# Patient Record
Sex: Female | Born: 1950 | Race: White | Hispanic: No | Marital: Married | Smoking: Never smoker
Health system: Southern US, Community
[De-identification: ages and names within clinical notes are randomized; demographics above are authoritative.]

## PROBLEM LIST (undated history)

## (undated) DIAGNOSIS — R011 Cardiac murmur, unspecified: Secondary | ICD-10-CM

## (undated) DIAGNOSIS — Z87898 Personal history of other specified conditions: Secondary | ICD-10-CM

## (undated) DIAGNOSIS — C4490 Unspecified malignant neoplasm of skin, unspecified: Secondary | ICD-10-CM

## (undated) DIAGNOSIS — D649 Anemia, unspecified: Secondary | ICD-10-CM

## (undated) DIAGNOSIS — R0600 Dyspnea, unspecified: Secondary | ICD-10-CM

## (undated) DIAGNOSIS — E119 Type 2 diabetes mellitus without complications: Secondary | ICD-10-CM

## (undated) DIAGNOSIS — K219 Gastro-esophageal reflux disease without esophagitis: Secondary | ICD-10-CM

## (undated) DIAGNOSIS — I209 Angina pectoris, unspecified: Secondary | ICD-10-CM

## (undated) DIAGNOSIS — I10 Essential (primary) hypertension: Secondary | ICD-10-CM

## (undated) DIAGNOSIS — G459 Transient cerebral ischemic attack, unspecified: Secondary | ICD-10-CM

## (undated) DIAGNOSIS — E7800 Pure hypercholesterolemia, unspecified: Secondary | ICD-10-CM

## (undated) DIAGNOSIS — Z95818 Presence of other cardiac implants and grafts: Secondary | ICD-10-CM

## (undated) DIAGNOSIS — C50919 Malignant neoplasm of unspecified site of unspecified female breast: Secondary | ICD-10-CM

## (undated) HISTORY — PX: DILATION AND CURETTAGE OF UTERUS: SHX78

## (undated) HISTORY — PX: HAND SURGERY: SHX662

## (undated) HISTORY — PX: KNEE ARTHROSCOPY: SHX127

## (undated) HISTORY — DX: Transient cerebral ischemic attack, unspecified: G45.9

## (undated) HISTORY — DX: Pure hypercholesterolemia, unspecified: E78.00

## (undated) HISTORY — PX: GYNECOLOGIC CRYOSURGERY: SHX857

## (undated) HISTORY — DX: Anemia, unspecified: D64.9

## (undated) HISTORY — DX: Essential (primary) hypertension: I10

## (undated) HISTORY — PX: FOOT SURGERY: SHX648

## (undated) HISTORY — DX: Personal history of other specified conditions: Z87.898

## (undated) HISTORY — PX: TUBAL LIGATION: SHX77

## (undated) HISTORY — DX: Malignant neoplasm of unspecified site of unspecified female breast: C50.919

## (undated) HISTORY — DX: Type 2 diabetes mellitus without complications: E11.9

---

## 1987-11-21 HISTORY — PX: BREAST CYST ASPIRATION: SHX578

## 2006-11-07 ENCOUNTER — Ambulatory Visit: Payer: Self-pay | Admitting: Internal Medicine

## 2006-11-09 ENCOUNTER — Ambulatory Visit: Payer: Self-pay | Admitting: Internal Medicine

## 2006-12-24 ENCOUNTER — Other Ambulatory Visit: Payer: Self-pay

## 2006-12-24 ENCOUNTER — Inpatient Hospital Stay: Payer: Self-pay | Admitting: Internal Medicine

## 2007-01-18 ENCOUNTER — Ambulatory Visit: Payer: Self-pay | Admitting: Internal Medicine

## 2007-07-08 ENCOUNTER — Ambulatory Visit: Payer: Self-pay | Admitting: Gastroenterology

## 2007-11-19 ENCOUNTER — Ambulatory Visit: Payer: Self-pay | Admitting: Internal Medicine

## 2008-03-27 ENCOUNTER — Ambulatory Visit: Payer: Self-pay | Admitting: Unknown Physician Specialty

## 2008-04-02 ENCOUNTER — Ambulatory Visit: Payer: Self-pay | Admitting: Unknown Physician Specialty

## 2008-05-18 ENCOUNTER — Ambulatory Visit: Payer: Self-pay | Admitting: Unknown Physician Specialty

## 2008-05-18 ENCOUNTER — Other Ambulatory Visit: Payer: Self-pay

## 2008-05-20 HISTORY — PX: OTHER SURGICAL HISTORY: SHX169

## 2008-06-02 ENCOUNTER — Ambulatory Visit: Payer: Self-pay | Admitting: Unknown Physician Specialty

## 2008-06-09 ENCOUNTER — Ambulatory Visit: Payer: Self-pay | Admitting: Unknown Physician Specialty

## 2008-11-19 ENCOUNTER — Ambulatory Visit: Payer: Self-pay | Admitting: Internal Medicine

## 2009-08-03 ENCOUNTER — Encounter: Admission: RE | Admit: 2009-08-03 | Discharge: 2009-08-03 | Payer: Self-pay | Admitting: Orthopedic Surgery

## 2009-10-01 ENCOUNTER — Ambulatory Visit: Payer: Self-pay | Admitting: Internal Medicine

## 2009-10-20 HISTORY — PX: FOOT SURGERY: SHX648

## 2009-11-11 LAB — HM DIABETES EYE EXAM

## 2009-11-18 ENCOUNTER — Ambulatory Visit: Payer: Self-pay | Admitting: Podiatry

## 2010-05-13 ENCOUNTER — Ambulatory Visit: Payer: Self-pay | Admitting: Internal Medicine

## 2010-06-06 ENCOUNTER — Ambulatory Visit: Payer: Self-pay | Admitting: Gastroenterology

## 2010-06-20 ENCOUNTER — Ambulatory Visit: Payer: Self-pay | Admitting: Internal Medicine

## 2010-07-12 ENCOUNTER — Ambulatory Visit: Payer: Self-pay | Admitting: Internal Medicine

## 2010-07-21 ENCOUNTER — Ambulatory Visit: Payer: Self-pay | Admitting: Internal Medicine

## 2010-10-10 ENCOUNTER — Ambulatory Visit: Payer: Self-pay | Admitting: Internal Medicine

## 2010-10-20 ENCOUNTER — Ambulatory Visit: Payer: Self-pay | Admitting: Internal Medicine

## 2011-03-30 ENCOUNTER — Ambulatory Visit: Payer: Self-pay | Admitting: Podiatry

## 2011-06-06 ENCOUNTER — Ambulatory Visit: Payer: Self-pay | Admitting: Internal Medicine

## 2011-06-08 ENCOUNTER — Ambulatory Visit: Payer: Self-pay | Admitting: Internal Medicine

## 2011-07-18 ENCOUNTER — Ambulatory Visit: Payer: Self-pay | Admitting: Anesthesiology

## 2011-07-20 ENCOUNTER — Ambulatory Visit: Payer: Self-pay | Admitting: Podiatry

## 2012-05-30 LAB — HM PAP SMEAR: HM Pap smear: NORMAL

## 2012-07-17 LAB — HM DIABETES EYE EXAM

## 2012-08-02 LAB — HM DIABETES EYE EXAM

## 2012-08-30 ENCOUNTER — Ambulatory Visit: Payer: Self-pay | Admitting: Unknown Physician Specialty

## 2012-09-20 ENCOUNTER — Ambulatory Visit: Payer: Self-pay | Admitting: Unknown Physician Specialty

## 2012-09-20 HISTORY — PX: KNEE SURGERY: SHX244

## 2012-10-10 ENCOUNTER — Encounter: Payer: Self-pay | Admitting: Internal Medicine

## 2012-10-10 ENCOUNTER — Ambulatory Visit (INDEPENDENT_AMBULATORY_CARE_PROVIDER_SITE_OTHER): Payer: Medicare Other | Admitting: Internal Medicine

## 2012-10-10 VITALS — BP 118/70 | HR 61 | Temp 97.9°F | Ht 66.0 in | Wt 183.5 lb

## 2012-10-10 DIAGNOSIS — G459 Transient cerebral ischemic attack, unspecified: Secondary | ICD-10-CM

## 2012-10-10 DIAGNOSIS — E119 Type 2 diabetes mellitus without complications: Secondary | ICD-10-CM

## 2012-10-10 DIAGNOSIS — C50919 Malignant neoplasm of unspecified site of unspecified female breast: Secondary | ICD-10-CM

## 2012-10-10 DIAGNOSIS — I10 Essential (primary) hypertension: Secondary | ICD-10-CM

## 2012-10-10 DIAGNOSIS — D649 Anemia, unspecified: Secondary | ICD-10-CM

## 2012-10-10 DIAGNOSIS — Z853 Personal history of malignant neoplasm of breast: Secondary | ICD-10-CM | POA: Insufficient documentation

## 2012-10-10 DIAGNOSIS — E1149 Type 2 diabetes mellitus with other diabetic neurological complication: Secondary | ICD-10-CM | POA: Insufficient documentation

## 2012-10-10 DIAGNOSIS — Z8673 Personal history of transient ischemic attack (TIA), and cerebral infarction without residual deficits: Secondary | ICD-10-CM | POA: Insufficient documentation

## 2012-10-10 DIAGNOSIS — E7800 Pure hypercholesterolemia, unspecified: Secondary | ICD-10-CM

## 2012-10-10 MED ORDER — TELMISARTAN 20 MG PO TABS: 20.0000 mg | ORAL_TABLET | Freq: Every day | ORAL | Status: DC

## 2012-10-10 MED ORDER — ROSUVASTATIN CALCIUM 10 MG PO TABS: 10.0000 mg | ORAL_TABLET | Freq: Every day | ORAL | Status: DC

## 2012-10-10 NOTE — Assessment & Plan Note (Signed)
On Simvastatin.  Low cholesterol diet and exercise.  Check lipid panel and liver function with next labs.  

## 2012-10-10 NOTE — Patient Instructions (Addendum)
It was nice seeing you today.  I am glad you are doing better.  Let me know if you need anything.  

## 2012-10-10 NOTE — Assessment & Plan Note (Signed)
On daily aspirin. Asymptomatic.  Continue risk factor modification.

## 2012-10-10 NOTE — Assessment & Plan Note (Signed)
Colonoscopy 8/08 normal.  EGD 5/09 revealed Schatzki's ring with hiatal hernia.  Currently asymptomatic.  Hgb 05/22/12 - wnl.  Follow cbc.

## 2012-10-14 ENCOUNTER — Other Ambulatory Visit (INDEPENDENT_AMBULATORY_CARE_PROVIDER_SITE_OTHER): Payer: Medicare Other

## 2012-10-14 ENCOUNTER — Other Ambulatory Visit: Payer: Medicare Other

## 2012-10-14 DIAGNOSIS — E119 Type 2 diabetes mellitus without complications: Secondary | ICD-10-CM

## 2012-10-14 DIAGNOSIS — E7800 Pure hypercholesterolemia, unspecified: Secondary | ICD-10-CM

## 2012-10-14 LAB — HEMOGLOBIN A1C: Hgb A1c MFr Bld: 6.6 % — ABNORMAL HIGH (ref 4.6–6.5)

## 2012-10-14 LAB — LIPID PANEL
Cholesterol: 171 mg/dL (ref 0–200)
HDL: 56.3 mg/dL (ref >39.00)
Triglycerides: 57 mg/dL (ref 0.0–149.0)

## 2012-10-27 ENCOUNTER — Encounter: Payer: Self-pay | Admitting: Internal Medicine

## 2012-10-27 NOTE — Assessment & Plan Note (Signed)
Continues follow up at [facility] - oncology.  (Dr Duff).  Mammograms followed at [facility].    

## 2012-10-27 NOTE — Assessment & Plan Note (Signed)
On simvastatin.  Low cholesterol diet and exercise.  Follow lipid panel and liver function.      

## 2012-10-27 NOTE — Progress Notes (Signed)
  Subjective:    Patient ID: Angel Floyd, female    DOB: Jun 11, 1951, 61 y.o.   MRN: [id number]  HPI 61 year old female with past history of hypercholesterolemia, hypertension, breast cancer and previously presumed TIA.  She comes in today for a scheduled follow up.  Had knee surgery three weeks ago for a torn meniscus.  S/p physical therapy.  Doing well.  Seeing Dr Erin Sons.  Did see Dr Rosita Kea for her right fourth finger.  Had a trigger finger.  Discussed injection.  Continue to follow up with ortho.  Overall she feels better.  Off her "cancer medication".  Has follow up tomorrow.  Brought in no recorded sugar readings.  States am sugar - around 140.  Now that she is feeling better, plans to start exercising more.    Past Medical History  Diagnosis Date  . Hypercholesterolemia   . Hypertension   . Diabetes mellitus   . Breast cancer   . Anemia   . TIA (transient ischemic attack)   . History of abnormal Pap smear     class III, required cryosurgery    Current Outpatient Prescriptions on File Prior to Visit  Medication Sig Dispense Refill  . Calcium-Vitamin D (CALTRATE 600 PLUS-VIT D PO) Take 1 tablet by mouth 2 (two) times daily.      Marland Kitchen esomeprazole (NEXIUM) 40 MG capsule Take 40 mg by mouth daily before breakfast.      . rosuvastatin (CRESTOR) 10 MG tablet Take 1 tablet (10 mg total) by mouth daily.  30 tablet  6  . telmisartan (MICARDIS) 20 MG tablet Take 1 tablet (20 mg total) by mouth daily.  30 tablet  6    Review of Systems Patient denies any headache, lightheadedness or dizziness. No sinus or allergy symptoms.   No chest pain, tightness or palpitations.  No increased shortness of breath, cough or congestion.  No nausea or vomiting.  No abdominal pain or cramping.  No bowel change, such as diarrhea, constipation, BRBPR or melana.  No urine change.  Knee is doing better.       Objective:   Physical Exam Filed Vitals:   10/10/12 1142  BP: 118/70  Pulse: 61  Temp: 97.9 F  (36.6 C)   Blood pressure recheck:  70/74  61 year old female in no acute distress.   HEENT:  Nares - clear.  OP- without lesions or erythema.  NECK:  Supple, nontender.  No audible bruit.   HEART:  Appears to be regular. LUNGS:  Without crackles or wheezing audible.  Respirations even and unlabored.   RADIAL PULSE:  Equal bilaterally.  ABDOMEN:  Soft, nontender.     EXTREMITIES:  No increased edema to be present.      Assessment & Plan:  MSK.  S/P knee surgery.  Doing well.  Continues to follow up with Dr Gavin Potters.  Will follow up with Dr Rosita Kea regarding her trigger finger.    CARDIOVASCULAR.  Stress test 02/10/09 revealed no ischemia.  EF 64%.  Follow.    HEALTH MAINTENANCE.  Physical 05/30/12.  Colonoscopy 8/08 normal.  Mammograms followed at [facility] - oncology.  Bone density scheduled through oncology.

## 2012-10-27 NOTE — Assessment & Plan Note (Signed)
Previous GI w/up included colonoscopy 07/08/07 - normal.  EGD 04/02/08 revealed Schatzki's ring with hiatal hernia.  Currently asymptomatic.  Hgb 05/22/12 - wnl.  Follow cbc.

## 2012-10-27 NOTE — Assessment & Plan Note (Signed)
Maintained on aspirin.  Currently asymptomatic.  No reoccurring symptoms.    

## 2012-10-27 NOTE — Assessment & Plan Note (Signed)
Brought in no recorded sugar readings.  She has desired to hold on medication.  Low carb diet and exercise.  Follow.  Check met b and a1c with next labs.

## 2012-10-27 NOTE — Assessment & Plan Note (Signed)
Blood pressure is doing well.  Continue same meds.  Follow metabolic panel.     

## 2012-12-04 ENCOUNTER — Telehealth: Payer: Self-pay | Admitting: Internal Medicine

## 2012-12-04 ENCOUNTER — Emergency Department: Payer: Self-pay

## 2012-12-04 ENCOUNTER — Ambulatory Visit: Payer: Medicare Other | Admitting: Adult Health

## 2012-12-04 LAB — CBC
HCT: 45.9 % (ref 35.0–47.0)
HGB: 16.2 g/dL — ABNORMAL HIGH (ref 12.0–16.0)
MCH: 31.3 pg (ref 26.0–34.0)
MCHC: 35.2 g/dL (ref 32.0–36.0)
MCV: 89 fL (ref 80–100)
RBC: 5.16 10*6/uL (ref 3.80–5.20)
RDW: 12.3 % (ref 11.5–14.5)

## 2012-12-04 LAB — COMPREHENSIVE METABOLIC PANEL
Albumin: 4.2 g/dL (ref 3.4–5.0)
Alkaline Phosphatase: 128 U/L (ref 50–136)
Anion Gap: 10 (ref 7–16)
Bilirubin,Total: 0.8 mg/dL (ref 0.2–1.0)
Chloride: 108 mmol/L — ABNORMAL HIGH (ref 98–107)
Creatinine: 0.9 mg/dL (ref 0.60–1.30)
EGFR (African American): >60
Osmolality: 291 (ref 275–301)
Potassium: 4.1 mmol/L (ref 3.5–5.1)
SGPT (ALT): 54 U/L (ref 12–78)
Total Protein: 7.5 g/dL (ref 6.4–8.2)

## 2012-12-04 LAB — URINALYSIS, COMPLETE
Bacteria: NONE SEEN
Bilirubin,UR: NEGATIVE
Blood: NEGATIVE
Glucose,UR: >=500 mg/dL (ref 0–75)
Nitrite: NEGATIVE
Ph: 5 (ref 4.5–8.0)
Protein: NEGATIVE
RBC,UR: 2 /HPF (ref 0–5)
Specific Gravity: 1.031 (ref 1.003–1.030)
WBC UR: 1 /HPF (ref 0–5)

## 2012-12-04 LAB — LIPASE, BLOOD: Lipase: 89 U/L (ref 73–393)

## 2012-12-04 NOTE — Telephone Encounter (Deleted)
Addendum to below:  Clinical profile completed per historian review.  Unable to access EPIC until end of call.

## 2012-12-04 NOTE — Telephone Encounter (Addendum)
Patient Information:  Caller Name: Maurine Minister  Phone: [phone]  Patient: Angel, Floyd  Gender: Female  DOB: [birth year]  Age: 62 Years  PCP: Dale Haliimaile  Office Follow Up:  Does the office need to follow up with this patient?: No  Instructions For The Office: N/A  RN Note:  Vomited x 4; Diarrhea about 15 times yellow and watery (since 05:00 12/04/12) with chills. Not likely food poisoning with husband and wife having same meal last night.  Afebrile.  Has tried ice chips. Patient does not wanting to come in.  RN concerned with amount of diarrhea and hydration.  Can hear patient retching as husband is talking to RN.  Explained the hydration concern with the amount of diarrhea qualifying as severe. Offered patient appointment, ER or to send message to office regarding patient not wanting to come in to see if something could be called. After much discussion decided to take appointment with Ms Rey at 14:00 today.  Symptoms  Reason For Call & Symptoms: Awoke at 05:00; with vomiting and diarrhea with chills  Reviewed Health History In EMR: Yes  Reviewed Medications In EMR: Yes  Reviewed Allergies In EMR: Yes  Reviewed Surgeries / Procedures: Yes  Date of Onset of Symptoms: 12/04/2012  Addendum to below:  Clinical profile completed per historian review.  Unable to access EPIC until end of call.  Guideline(s) Used:  Diarrhea  Disposition Per Guideline:   Go to Office Now  Reason For Disposition Reached:   Age > 60 years and has had > 6 diarrhea stools in past 24 hours  Advice Given:  N/A  Appointment Scheduled:  12/04/2012 14:00:00 Appointment Scheduled Provider:  Orville Govern

## 2012-12-04 NOTE — Telephone Encounter (Signed)
Forward to Dr. Scott 

## 2012-12-05 ENCOUNTER — Ambulatory Visit: Payer: Self-pay | Admitting: Internal Medicine

## 2013-02-07 ENCOUNTER — Encounter: Payer: Self-pay | Admitting: Internal Medicine

## 2013-02-07 ENCOUNTER — Ambulatory Visit (INDEPENDENT_AMBULATORY_CARE_PROVIDER_SITE_OTHER): Payer: Medicare Other | Admitting: Internal Medicine

## 2013-02-07 VITALS — BP 110/70 | HR 74 | Temp 97.9°F | Ht 66.0 in | Wt 182.8 lb

## 2013-02-07 DIAGNOSIS — E119 Type 2 diabetes mellitus without complications: Secondary | ICD-10-CM

## 2013-02-07 DIAGNOSIS — G459 Transient cerebral ischemic attack, unspecified: Secondary | ICD-10-CM

## 2013-02-07 DIAGNOSIS — D649 Anemia, unspecified: Secondary | ICD-10-CM

## 2013-02-07 DIAGNOSIS — E7800 Pure hypercholesterolemia, unspecified: Secondary | ICD-10-CM

## 2013-02-07 DIAGNOSIS — I10 Essential (primary) hypertension: Secondary | ICD-10-CM

## 2013-02-09 ENCOUNTER — Encounter: Payer: Self-pay | Admitting: Internal Medicine

## 2013-02-09 NOTE — Assessment & Plan Note (Signed)
Brought in no recorded sugar readings.  Low carb diet and exercise.  Follow.  Check met b and a1c with next labs.

## 2013-02-09 NOTE — Assessment & Plan Note (Signed)
Maintained on aspirin.  Currently asymptomatic.  No reoccurring symptoms.    

## 2013-02-09 NOTE — Assessment & Plan Note (Signed)
Continues follow up at [facility] - oncology.  (Dr Duff).  Mammograms followed at [facility].    

## 2013-02-09 NOTE — Assessment & Plan Note (Signed)
Previous GI w/up included colonoscopy 07/08/07 - normal.  EGD 04/02/08 revealed Schatzki's ring with hiatal hernia.  Currently asymptomatic.  Hgb 05/22/12 - wnl.  Follow cbc.

## 2013-02-09 NOTE — Assessment & Plan Note (Signed)
Blood pressure is doing well.  Continue same meds.  Follow metabolic panel.     

## 2013-02-09 NOTE — Assessment & Plan Note (Signed)
On simvastatin.  Low cholesterol diet and exercise.  Follow lipid panel and liver function.      

## 2013-02-09 NOTE — Progress Notes (Signed)
Subjective:    Patient ID: Angel Floyd, female    DOB: [birth year], 62 y.o.   MRN: [id number]  HPI 62 year old female with past history of hypercholesterolemia, hypertension, breast cancer and previously presumed TIA.  She comes in today for a scheduled follow up.  Had knee surgery for a torn meniscus.  S/p physical therapy.  Seeing Dr Erin Sons.  Knee still bothering her.  Planning to follow up with Dr Gavin Potters.   Did see Dr Rosita Kea for her right fourth finger.  Had a trigger finger.  Discussed injection.  Continue to follow up with ortho.  Overall she feels she is doing relatively well.  Off her "cancer medication".  She has had some increased congestion.  Using nasal spray and mucinex and doing better.  Joined [employer].  Riding the exercise bike.  No nausea or vomiting.  Bowels stable.     Past Medical History  Diagnosis Date  . Hypercholesterolemia   . Hypertension   . Diabetes mellitus   . Breast cancer   . Anemia   . TIA (transient ischemic attack)   . History of abnormal Pap smear     class III, required cryosurgery    Current Outpatient Prescriptions on File Prior to Visit  Medication Sig Dispense Refill  . aspirin 81 MG tablet Take 81 mg by mouth daily.      . Calcium-Vitamin D (CALTRATE 600 PLUS-VIT D PO) Take 1 tablet by mouth 2 (two) times daily.      Marland Kitchen esomeprazole (NEXIUM) 40 MG capsule Take 40 mg by mouth daily before breakfast.      . FeFum-FePoly-FA-B Cmp-C-Biot (INTEGRA PLUS PO) Take by mouth every evening.      . fish oil-omega-3 fatty acids 1000 MG capsule Take 1 g by mouth 2 (two) times daily.      . rosuvastatin (CRESTOR) 10 MG tablet Take 1 tablet (10 mg total) by mouth daily.  30 tablet  6  . telmisartan (MICARDIS) 20 MG tablet Take 1 tablet (20 mg total) by mouth daily.  30 tablet  6   No current facility-administered medications on file prior to visit.    Review of Systems Patient denies any headache, lightheadedness or dizziness.  Congestion has  improved.  No chest pain, tightness or palpitations.  No increased shortness of breath, cough or congestion.  No nausea or vomiting.  No abdominal pain or cramping.  No acid reflux.  No bowel change, such as diarrhea, constipation, BRBPR or melana.  No urine change.  Knee issues as outlined.      Objective:   Physical Exam  Filed Vitals:   02/07/13 0904  BP: 110/70  Pulse: 74  Temp: 97.9 F (36.6 C)   Blood pressure recheck:  130/68, pulse 54  62 year old female in no acute distress.   HEENT:  Nares - clear.  OP- without lesions or erythema.  NECK:  Supple, nontender.  No audible bruit.   HEART:  Appears to be regular. LUNGS:  Without crackles or wheezing audible.  Respirations even and unlabored.   RADIAL PULSE:  Equal bilaterally.  ABDOMEN:  Soft, nontender.     EXTREMITIES:  No increased edema to be present.      Assessment & Plan:  MSK.  S/P knee surgery.  Continues to follow up with Dr Gavin Potters.  Will follow up with Dr Rosita Kea regarding her trigger finger.    CARDIOVASCULAR.  Stress test 02/10/09 revealed no ischemia.  EF 64%.  Follow.  HEALTH MAINTENANCE.  Physical 05/30/12.  Colonoscopy 8/08 normal.  Mammograms followed at [facility] - oncology.  Bone density scheduled through oncology.

## 2013-02-14 ENCOUNTER — Other Ambulatory Visit (INDEPENDENT_AMBULATORY_CARE_PROVIDER_SITE_OTHER): Payer: PRIVATE HEALTH INSURANCE

## 2013-02-14 ENCOUNTER — Ambulatory Visit: Payer: Self-pay | Admitting: Unknown Physician Specialty

## 2013-02-14 DIAGNOSIS — E119 Type 2 diabetes mellitus without complications: Secondary | ICD-10-CM

## 2013-02-14 DIAGNOSIS — E7800 Pure hypercholesterolemia, unspecified: Secondary | ICD-10-CM

## 2013-02-14 LAB — LIPID PANEL
HDL: 53.2 mg/dL (ref >39.00)
Total CHOL/HDL Ratio: 3
Triglycerides: 58 mg/dL (ref 0.0–149.0)

## 2013-02-14 LAB — HEPATIC FUNCTION PANEL
AST: 19 U/L (ref 0–37)
Albumin: 3.9 g/dL (ref 3.5–5.2)

## 2013-02-14 LAB — BASIC METABOLIC PANEL
CO2: 28 mEq/L (ref 19–32)
Calcium: 9 mg/dL (ref 8.4–10.5)
GFR: 86.15 mL/min (ref >60.00)
Sodium: 139 mEq/L (ref 135–145)

## 2013-02-15 ENCOUNTER — Telehealth: Payer: Self-pay | Admitting: Internal Medicine

## 2013-02-15 NOTE — Telephone Encounter (Signed)
Notified of lab results via my chart.  

## 2013-03-11 ENCOUNTER — Telehealth: Payer: Self-pay | Admitting: Internal Medicine

## 2013-03-11 ENCOUNTER — Encounter: Payer: Self-pay | Admitting: Internal Medicine

## 2013-03-11 NOTE — Telephone Encounter (Signed)
This pt sent me a my chart message regarding needing a pre op clearance.  I do need to see her.  See if she can come in on 03/24/13 at 11:30.   Thanks.

## 2013-03-12 NOTE — Telephone Encounter (Signed)
Pt aware of appointment 

## 2013-03-24 ENCOUNTER — Encounter: Payer: Self-pay | Admitting: Internal Medicine

## 2013-03-24 ENCOUNTER — Ambulatory Visit (INDEPENDENT_AMBULATORY_CARE_PROVIDER_SITE_OTHER): Payer: PRIVATE HEALTH INSURANCE | Admitting: Internal Medicine

## 2013-03-24 VITALS — BP 110/80 | HR 57 | Temp 98.3°F | Ht 66.0 in | Wt 181.5 lb

## 2013-03-24 DIAGNOSIS — Z01810 Encounter for preprocedural cardiovascular examination: Secondary | ICD-10-CM

## 2013-03-24 DIAGNOSIS — D649 Anemia, unspecified: Secondary | ICD-10-CM

## 2013-03-24 DIAGNOSIS — R109 Unspecified abdominal pain: Secondary | ICD-10-CM

## 2013-03-24 DIAGNOSIS — E119 Type 2 diabetes mellitus without complications: Secondary | ICD-10-CM

## 2013-03-24 DIAGNOSIS — G459 Transient cerebral ischemic attack, unspecified: Secondary | ICD-10-CM

## 2013-03-24 DIAGNOSIS — I10 Essential (primary) hypertension: Secondary | ICD-10-CM

## 2013-03-26 ENCOUNTER — Encounter: Payer: Self-pay | Admitting: Internal Medicine

## 2013-03-26 NOTE — Progress Notes (Signed)
Subjective:    Patient ID: Angel Floyd, female    DOB: 08/02/1951, 62 y.o.   MRN: [id number]  HPI 62 year old female with past history of hypercholesterolemia, hypertension, breast cancer and previously presumed TIA.  She comes in today for a pre op evaluation.   Is s/p  knee surgery for a torn meniscus.  S/p physical therapy.  Seeing Dr Erin Sons.  Knee still bothering her.  Planning to have MAKOplasty procedure on her right knee.  She states she has not been able to do as much exercise because her knee limits her.  She denies any chest pain or tightness.  No sob.  Breathing stable.  No cough or congestion.  She does report some stomach issues.  Will have some discomfort and then 4-5 bowel movements.  Starts out formed and then turns into diarrhea.  Describes it as lower abdominal discomfort.  No known triggers.  Not sure that food triggers.  Pain resolves with her bowel movements.  Occasional emesis.  Having none of these symptoms now.  May occur every 3-4 weeks.  Resolves on its own.  No blood.  Eating and drinking well otherwise.  Has had EGD and colonoscopy 06/06/10.      Past Medical History  Diagnosis Date  . Hypercholesterolemia   . Hypertension   . Diabetes mellitus   . Breast cancer   . Anemia   . TIA (transient ischemic attack)   . History of abnormal Pap smear     class III, required cryosurgery    Current Outpatient Prescriptions on File Prior to Visit  Medication Sig Dispense Refill  . aspirin 81 MG tablet Take 81 mg by mouth daily.      . Calcium-Vitamin D (CALTRATE 600 PLUS-VIT D PO) Take 1 tablet by mouth 2 (two) times daily.      Marland Kitchen esomeprazole (NEXIUM) 40 MG capsule Take 40 mg by mouth daily before breakfast.      . FeFum-FePoly-FA-B Cmp-C-Biot (INTEGRA PLUS PO) Take by mouth every evening.      . fish oil-omega-3 fatty acids 1000 MG capsule Take 1 g by mouth 2 (two) times daily.      . rosuvastatin (CRESTOR) 10 MG tablet Take 1 tablet (10 mg total) by mouth daily.   30 tablet  6  . telmisartan (MICARDIS) 20 MG tablet Take 1 tablet (20 mg total) by mouth daily.  30 tablet  6   No current facility-administered medications on file prior to visit.    Review of Systems Patient denies any headache, lightheadedness or dizziness.  No sinus congestion or allergies.  No chest pain, tightness or palpitations.  No increased shortness of breath, cough or congestion.  Breathing stable.  No nausea or vomiting now.  GI symptoms as outlined.  Lower abdominal discomfort and increased bowel movements.  After her bowels "clear" - feels better.   No acid reflux.  No BRBPR or melana.  No urine change.  Knee issues as outlined.  Planning for surgery.  Did well with her last surgery.      Objective:   Physical Exam  Filed Vitals:   03/24/13 1120  BP: 110/80  Pulse: 57  Temp: 98.3 F (36.8 C)   Blood pressure recheck:  128/78, pulse 58  62 year old female in no acute distress.   HEENT:  Nares - clear.  OP- without lesions or erythema.  NECK:  Supple, nontender.  No audible bruit.   HEART:  Appears to be regular. LUNGS:  Without crackles or wheezing audible.  Respirations even and unlabored.   RADIAL PULSE:  Equal bilaterally.  ABDOMEN:  Soft, nontender.  Bowel sounds present and normal.    EXTREMITIES:  No increased edema to be present.      Assessment & Plan:  MSK.  Persistent knee pain.  Planning MAKOplasty.  Pre Op EKG obtained and revealed SR with no acute ischemic changes.  She denies any chest pain, tightness or sob.  Breathing stable.  She just had the recent surgery with no complications.  I feel that she is at low risk from a cardiac standpoint to proceed with the planned surgery.  She will need close intra op and post op monitoring of her heart rate and blood pressure to avoid extremes.  She is aware of risk of being off aspirin.     CARDIOVASCULAR.  Stress test 02/10/09 revealed no ischemia.  EF 64%.  EKG as outlined.  No chest pain or tightness. Breathing  stable.  I feel that she is at low risk from a cardiac standpoint to proceed with the planned procedure.  She will need close intra op and post op monitoring of her heart rate and blood pressure to avoid extremes.    ABDOMINAL PAIN.  Persistent intermittent pain with associated diarrhea as outlined.  No symptoms now.  No known triggers.  Taking nexium and feels this is controlling her symptoms.  Will obtain an abdominal ultrasound this week.  Further w/up pending.     HEALTH MAINTENANCE.  Physical 05/30/12.  Colonoscopy 8/08 normal.  Mammograms followed at [facility] - oncology.  Bone density scheduled through oncology.

## 2013-03-26 NOTE — Assessment & Plan Note (Signed)
Previous GI w/up included colonoscopy 07/08/07 - normal.  EGD 04/02/08 revealed Schatzki's ring with hiatal hernia.  Currently asymptomatic.  Hgb 05/22/12 - wnl.  Follow cbc.  Consider checking cbc prior to surgery.

## 2013-03-26 NOTE — Assessment & Plan Note (Addendum)
Low carb diet and exercise.  Follow met b and a1c.  On no medication.  Last a1c 6.6.

## 2013-03-26 NOTE — Assessment & Plan Note (Signed)
Blood pressure is doing well.  Continue same meds.  Follow metabolic panel.  Will need close intra op and post op monitoring of her heart rate and blood pressure to avoid extremes.

## 2013-03-26 NOTE — Assessment & Plan Note (Signed)
Maintained on aspirin.  Currently asymptomatic.  No reoccurring symptoms.  She understands risk of being off aspirin.  Will clarify how long needs to be off with Dr Gavin Potters.

## 2013-03-28 ENCOUNTER — Ambulatory Visit: Payer: Self-pay | Admitting: Internal Medicine

## 2013-03-29 ENCOUNTER — Encounter: Payer: Self-pay | Admitting: Internal Medicine

## 2013-04-02 ENCOUNTER — Ambulatory Visit: Payer: Self-pay | Admitting: Unknown Physician Specialty

## 2013-04-22 ENCOUNTER — Encounter: Payer: Self-pay | Admitting: Internal Medicine

## 2013-06-09 ENCOUNTER — Encounter: Payer: Medicare Other | Admitting: Internal Medicine

## 2013-06-16 ENCOUNTER — Encounter: Payer: Self-pay | Admitting: Internal Medicine

## 2013-06-16 ENCOUNTER — Ambulatory Visit (INDEPENDENT_AMBULATORY_CARE_PROVIDER_SITE_OTHER): Payer: PRIVATE HEALTH INSURANCE | Admitting: Internal Medicine

## 2013-06-16 VITALS — BP 130/70 | HR 80 | Temp 97.9°F | Ht 65.0 in | Wt 179.5 lb

## 2013-06-16 DIAGNOSIS — E119 Type 2 diabetes mellitus without complications: Secondary | ICD-10-CM

## 2013-06-16 DIAGNOSIS — G459 Transient cerebral ischemic attack, unspecified: Secondary | ICD-10-CM

## 2013-06-16 DIAGNOSIS — I10 Essential (primary) hypertension: Secondary | ICD-10-CM

## 2013-06-16 DIAGNOSIS — D649 Anemia, unspecified: Secondary | ICD-10-CM

## 2013-06-16 DIAGNOSIS — E7800 Pure hypercholesterolemia, unspecified: Secondary | ICD-10-CM

## 2013-06-16 NOTE — Progress Notes (Signed)
Subjective:    Patient ID: Angel Floyd, female    DOB: 13-Jan-1951, 62 y.o.   MRN: [id number]  HPI 62 year old female with past history of hypercholesterolemia, hypertension, breast cancer and previously presumed TIA.   Is s/p  knee surgery for a torn meniscus.  S/p physical therapy.  Seeing Dr Erin Sons.  Is s/p MAKOplasty procedure on her right knee.  She comes in today to follow up on these issues as well as for a complete physical exam.  She denies any chest pain or tightness.  No sob.  Breathing stable.  No cough or congestion.  Eating and drinking well.  No significant stomach issues.  Has had EGD and colonoscopy 06/06/10.  Increased stress.  Brother just had a stroke.      Past Medical History  Diagnosis Date  . Hypercholesterolemia   . Hypertension   . Diabetes mellitus   . Breast cancer   . Anemia   . TIA (transient ischemic attack)   . History of abnormal Pap smear     class III, required cryosurgery    Current Outpatient Prescriptions on File Prior to Visit  Medication Sig Dispense Refill  . aspirin 81 MG tablet Take 81 mg by mouth daily.      . Calcium-Vitamin D (CALTRATE 600 PLUS-VIT D PO) Take 1 tablet by mouth 2 (two) times daily.      Marland Kitchen esomeprazole (NEXIUM) 40 MG capsule Take 40 mg by mouth daily before breakfast.      . FeFum-FePoly-FA-B Cmp-C-Biot (INTEGRA PLUS PO) Take by mouth every evening.      . fish oil-omega-3 fatty acids 1000 MG capsule Take 1 g by mouth 2 (two) times daily.      . rosuvastatin (CRESTOR) 10 MG tablet Take 1 tablet (10 mg total) by mouth daily.  30 tablet  6  . telmisartan (MICARDIS) 20 MG tablet Take 1 tablet (20 mg total) by mouth daily.  30 tablet  6   No current facility-administered medications on file prior to visit.    Review of Systems Patient denies any headache, lightheadedness or dizziness.  No sinus congestion or allergies.  No chest pain, tightness or palpitations.  No increased shortness of breath, cough or congestion.   Breathing stable.  No nausea or vomiting.  No abdominal pain or cramping.  Bowels stable.  No constipation or diarrhea.  Doing well s/p knee surgery.  Doing her therapy and exercises at home.  Increased stress.  Feels she is handling things relatively well.        Objective:   Physical Exam  Filed Vitals:   06/16/13 1336  BP: 130/70  Pulse: 80  Temp: 97.9 F (36.6 C)   Pulse recheck:  26  62 year old female in no acute distress.   HEENT:  Nares- clear.  Oropharynx - without lesions. NECK:  Supple.  Nontender.  Question of radiation of murmur vs bruit.   HEART:  Appears to be regular.  I/VI systolic murmur.   LUNGS:  No crackles or wheezing audible.  Respirations even and unlabored.  RADIAL PULSE:  Equal bilaterally.    BREASTS:  No nipple discharge or nipple retraction present.  Could not appreciate any distinct nodules or axillary adenopathy.  ABDOMEN:  Soft, nontender.  Bowel sounds present and normal.  No audible abdominal bruit.  GU: not performed.    EXTREMITIES:  No increased edema present.  DP pulses palpable and equal bilaterally.  Assessment & Plan:  MSK.  Is s/p right knee surgery and doing well.  S/p PT.  Follow.      CARDIOVASCULAR.  Stress test 02/10/09 revealed no ischemia.  EF 64%.  EKG as outlined.  No chest pain or tightness. Breathing stable.    ABDOMINAL PAIN.  Has not been a significant issue since her last visit here.  Wants to continue to monitor.  Follow.     HEALTH MAINTENANCE.  Physical today.  Colonoscopy 06/06/10 normal.  Recommended follow up colonoscopy in five years.   Mammograms followed at [facility] - oncology.  Bone density scheduled through oncology and pt states bone density ok.

## 2013-06-17 ENCOUNTER — Encounter: Payer: Self-pay | Admitting: Internal Medicine

## 2013-06-17 ENCOUNTER — Other Ambulatory Visit: Payer: Self-pay | Admitting: *Deleted

## 2013-06-17 MED ORDER — TELMISARTAN 20 MG PO TABS: 20.0000 mg | ORAL_TABLET | Freq: Every day | ORAL | Status: DC

## 2013-06-17 MED ORDER — ESOMEPRAZOLE MAGNESIUM 40 MG PO CPDR: 40.0000 mg | DELAYED_RELEASE_CAPSULE | Freq: Every day | ORAL | Status: DC

## 2013-06-17 NOTE — Assessment & Plan Note (Signed)
Continues follow up at [facility] - oncology.  (Dr Duff).  Mammograms followed at [facility].    

## 2013-06-17 NOTE — Assessment & Plan Note (Signed)
Blood pressure is doing well.  Continue same meds.  Follow metabolic panel.     

## 2013-06-17 NOTE — Assessment & Plan Note (Signed)
On simvastatin.  Low cholesterol diet and exercise.  Follow lipid panel and liver function.      

## 2013-06-17 NOTE — Assessment & Plan Note (Signed)
Maintained on aspirin.  Currently asymptomatic.  No reoccurring symptoms.  Consider f/u carotid ultrasound.  Obtain records - when last performed.

## 2013-06-17 NOTE — Assessment & Plan Note (Signed)
Low carb diet and exercise.  Follow met b and a1c.  On no medication.  Last a1c 6.6.

## 2013-06-17 NOTE — Assessment & Plan Note (Signed)
Previous GI w/up included colonoscopy 07/08/07 - normal.  EGD 04/02/08 revealed Schatzki's ring with hiatal hernia.  Currently asymptomatic.  Hgb 05/22/12 - wnl.  Follow cbc.  Most recent colonoscopy 06/06/10 - normal.  Recommended follow up colonoscopy in five years.   

## 2013-06-19 ENCOUNTER — Other Ambulatory Visit: Payer: Self-pay | Admitting: *Deleted

## 2013-06-19 ENCOUNTER — Other Ambulatory Visit (INDEPENDENT_AMBULATORY_CARE_PROVIDER_SITE_OTHER): Payer: PRIVATE HEALTH INSURANCE

## 2013-06-19 DIAGNOSIS — E119 Type 2 diabetes mellitus without complications: Secondary | ICD-10-CM

## 2013-06-19 DIAGNOSIS — E7800 Pure hypercholesterolemia, unspecified: Secondary | ICD-10-CM

## 2013-06-19 LAB — LIPID PANEL
Cholesterol: 175 mg/dL (ref 0–200)
VLDL: 14 mg/dL (ref 0.0–40.0)

## 2013-06-19 LAB — HEMOGLOBIN A1C: Hgb A1c MFr Bld: 7.1 % — ABNORMAL HIGH (ref 4.6–6.5)

## 2013-06-19 MED ORDER — ROSUVASTATIN CALCIUM 10 MG PO TABS: 10.0000 mg | ORAL_TABLET | Freq: Every day | ORAL | Status: DC

## 2013-06-20 ENCOUNTER — Encounter: Payer: Self-pay | Admitting: Internal Medicine

## 2013-06-20 ENCOUNTER — Telehealth: Payer: Self-pay | Admitting: Internal Medicine

## 2013-06-20 ENCOUNTER — Encounter: Payer: Self-pay | Admitting: *Deleted

## 2013-06-20 NOTE — Telephone Encounter (Signed)
Appointment 8/18/  Sent pt my chart message letting her know about appointment

## 2013-06-20 NOTE — Telephone Encounter (Signed)
Pt notified of lab results and need for a f/u appt.  Please schedule her for a f/u appt within the next couple of weeks to discuss diabetes treatment.  Thanks.

## 2013-06-30 ENCOUNTER — Encounter: Payer: Self-pay | Admitting: Internal Medicine

## 2013-07-07 ENCOUNTER — Ambulatory Visit (INDEPENDENT_AMBULATORY_CARE_PROVIDER_SITE_OTHER): Payer: PRIVATE HEALTH INSURANCE | Admitting: Internal Medicine

## 2013-07-07 ENCOUNTER — Encounter: Payer: Self-pay | Admitting: Internal Medicine

## 2013-07-07 VITALS — BP 120/70 | HR 73 | Temp 98.2°F | Ht 65.0 in | Wt 180.5 lb

## 2013-07-07 DIAGNOSIS — I10 Essential (primary) hypertension: Secondary | ICD-10-CM

## 2013-07-07 DIAGNOSIS — Z23 Encounter for immunization: Secondary | ICD-10-CM

## 2013-07-07 DIAGNOSIS — E119 Type 2 diabetes mellitus without complications: Secondary | ICD-10-CM

## 2013-07-07 DIAGNOSIS — G459 Transient cerebral ischemic attack, unspecified: Secondary | ICD-10-CM

## 2013-07-07 DIAGNOSIS — E7800 Pure hypercholesterolemia, unspecified: Secondary | ICD-10-CM

## 2013-07-07 LAB — HM DIABETES FOOT EXAM

## 2013-07-07 MED ORDER — METFORMIN HCL 500 MG PO TABS: 500.0000 mg | ORAL_TABLET | Freq: Every day | ORAL | Status: DC

## 2013-07-08 ENCOUNTER — Encounter: Payer: Self-pay | Admitting: Internal Medicine

## 2013-07-08 LAB — MICROALBUMIN / CREATININE URINE RATIO
Creatinine,U: 130.8 mg/dL
Microalb Creat Ratio: 1.2 mg/g (ref 0.0–30.0)

## 2013-07-08 NOTE — Assessment & Plan Note (Signed)
Low carb diet and exercise.  Recent a1c elevated - 7.1.  On no medication.  Last a1c 6.6.  Will start metformin 500mg  q day.  Follow sugars.  Have her send in readings within the next few weeks.  Sees Dr Dorcas Mcmurray for her eyes.

## 2013-07-08 NOTE — Assessment & Plan Note (Signed)
Maintained on aspirin.  Currently asymptomatic.  No reoccurring symptoms.  Consider f/u carotid ultrasound.  Need records - when last performed.

## 2013-07-08 NOTE — Assessment & Plan Note (Signed)
On simvastatin.  Low cholesterol diet and exercise.  Follow lipid panel and liver function.      

## 2013-07-08 NOTE — Progress Notes (Signed)
Subjective:    Patient ID: Angel Floyd, female    DOB: 23-Mar-1951, 62 y.o.   MRN: [id number]  HPI 62 year old female with past history of hypercholesterolemia, hypertension, breast cancer and previously presumed TIA.   Is s/p  knee surgery for a torn meniscus.  S/p physical therapy.  Seeing Dr Erin Sons.  Is s/p MAKOplasty procedure on her right knee.  She comes in today for a scheduled follow up.   She denies any chest pain or tightness.  No sob.  Breathing stable.  No cough or congestion.  Eating and drinking well.  No significant stomach issues.  Has had EGD and colonoscopy 06/06/10.  Increased stress.  Brother just had a stroke.  Also, had to have a knee surgery secondary to infection.  Also, dealing with other family stressors and family health issues.  Not exercising since all of this has been going on with her brother.      Past Medical History  Diagnosis Date  . Hypercholesterolemia   . Hypertension   . Diabetes mellitus   . Breast cancer   . Anemia   . TIA (transient ischemic attack)   . History of abnormal Pap smear     class III, required cryosurgery    Current Outpatient Prescriptions on File Prior to Visit  Medication Sig Dispense Refill  . aspirin 81 MG tablet Take 81 mg by mouth daily.      . Calcium-Vitamin D (CALTRATE 600 PLUS-VIT D PO) Take 1 tablet by mouth 2 (two) times daily.      Marland Kitchen esomeprazole (NEXIUM) 40 MG capsule Take 1 capsule (40 mg total) by mouth daily before breakfast.  30 capsule  5  . FeFum-FePoly-FA-B Cmp-C-Biot (INTEGRA PLUS PO) Take by mouth every evening.      . fish oil-omega-3 fatty acids 1000 MG capsule Take 1 g by mouth 2 (two) times daily.      . rosuvastatin (CRESTOR) 10 MG tablet Take 1 tablet (10 mg total) by mouth daily.  30 tablet  5  . telmisartan (MICARDIS) 20 MG tablet Take 1 tablet (20 mg total) by mouth daily.  30 tablet  5   No current facility-administered medications on file prior to visit.    Review of Systems Patient  denies any headache, lightheadedness or dizziness.  No sinus congestion or allergies.  No chest pain, tightness or palpitations.  No increased shortness of breath, cough or congestion.  Breathing stable.  No nausea or vomiting.  No abdominal pain or cramping.  Bowels stable.  No constipation or diarrhea.  Doing well s/p knee surgery.   Increased stress.  Feels she is handling things relatively well.  Desires no further intervention.  Sugars as outlined.  Recent a1c elevated.         Objective:   Physical Exam  Filed Vitals:   07/07/13 1011  BP: 120/70  Pulse: 73  Temp: 98.2 F (36.8 C)   Pulse recheck:  64  62 year old female in no acute distress.   HEENT:  Nares- clear.  Oropharynx - without lesions. NECK:  Supple.  Nontender.  Question of radiation of murmur vs bruit.   HEART:  Appears to be regular.  I/VI systolic murmur.   LUNGS:  No crackles or wheezing audible.  Respirations even and unlabored.  RADIAL PULSE:  Equal bilaterally.  ABDOMEN:  Soft, nontender.  Bowel sounds present and normal.  No audible abdominal bruit.   EXTREMITIES:  No increased edema present.  DP  pulses palpable and equal bilaterally.  FEET:  Without lesions.            Assessment & Plan:  MSK.  Is s/p right knee surgery and doing well.  S/p PT.  Follow.      CARDIOVASCULAR.  Stress test 02/10/09 revealed no ischemia.  EF 64%.  EKG as outlined.  No chest pain or tightness. Breathing stable.     HEALTH MAINTENANCE.  Physical last visit.  Colonoscopy 06/06/10 normal.  Recommended follow up colonoscopy in five years.   Mammograms followed at [facility] - oncology.  Bone density scheduled through oncology and pt states bone density ok.  Pneumovax given today.  She has the information regarding the shingles vaccine.

## 2013-07-08 NOTE — Assessment & Plan Note (Signed)
Blood pressure is doing well.  Continue same meds.  Follow metabolic panel.     

## 2013-07-11 ENCOUNTER — Encounter: Payer: Self-pay | Admitting: Internal Medicine

## 2013-07-11 ENCOUNTER — Telehealth: Payer: Self-pay | Admitting: *Deleted

## 2013-07-11 MED ORDER — GLUCOSE BLOOD VI STRP: ORAL_STRIP | Status: DC

## 2013-07-11 NOTE — Telephone Encounter (Signed)
Pt sent myChart message, needing test strips for her One Touch Ultra. Sent to [facility].

## 2013-08-07 LAB — HM DIABETES EYE EXAM

## 2013-08-09 ENCOUNTER — Encounter: Payer: Self-pay | Admitting: Internal Medicine

## 2013-09-08 ENCOUNTER — Other Ambulatory Visit: Payer: Self-pay | Admitting: *Deleted

## 2013-09-08 NOTE — Telephone Encounter (Signed)
Received refill request but directions blank. Doe pt take this just once daily?

## 2013-09-09 MED ORDER — INTEGRA PLUS PO CAPS: ORAL_CAPSULE | ORAL | Status: DC

## 2013-09-09 NOTE — Telephone Encounter (Signed)
Please call pt and confirm how she is taking.  Thanks.

## 2013-09-09 NOTE — Telephone Encounter (Signed)
Spoke with pt, verified she takes Integra Plus once daily. Also states she had Shingles vaccine 09/04/13 at [facility]. Chart updated. Rx sent to pharmacy by escript

## 2013-10-01 ENCOUNTER — Encounter: Payer: Self-pay | Admitting: Internal Medicine

## 2013-10-01 ENCOUNTER — Other Ambulatory Visit (INDEPENDENT_AMBULATORY_CARE_PROVIDER_SITE_OTHER): Payer: PRIVATE HEALTH INSURANCE

## 2013-10-01 DIAGNOSIS — E7800 Pure hypercholesterolemia, unspecified: Secondary | ICD-10-CM

## 2013-10-01 DIAGNOSIS — E119 Type 2 diabetes mellitus without complications: Secondary | ICD-10-CM

## 2013-10-01 LAB — HEMOGLOBIN A1C: Hgb A1c MFr Bld: 6.8 % — ABNORMAL HIGH (ref 4.6–6.5)

## 2013-10-01 LAB — BASIC METABOLIC PANEL
BUN: 19 mg/dL (ref 6–23)
Calcium: 9.2 mg/dL (ref 8.4–10.5)
Creatinine, Ser: 0.7 mg/dL (ref 0.4–1.2)
GFR: 84.63 mL/min (ref >60.00)
Glucose, Bld: 134 mg/dL — ABNORMAL HIGH (ref 70–99)

## 2013-10-01 LAB — HEPATIC FUNCTION PANEL
ALT: 28 U/L (ref 0–35)
AST: 19 U/L (ref 0–37)
Bilirubin, Direct: 0.1 mg/dL (ref 0.0–0.3)
Total Bilirubin: 0.8 mg/dL (ref 0.3–1.2)

## 2013-10-01 LAB — LIPID PANEL: Total CHOL/HDL Ratio: 3

## 2013-10-07 ENCOUNTER — Encounter: Payer: Self-pay | Admitting: Internal Medicine

## 2013-10-07 ENCOUNTER — Ambulatory Visit (INDEPENDENT_AMBULATORY_CARE_PROVIDER_SITE_OTHER): Payer: PRIVATE HEALTH INSURANCE | Admitting: Internal Medicine

## 2013-10-07 VITALS — BP 130/90 | HR 68 | Temp 98.1°F | Ht 65.0 in | Wt 175.0 lb

## 2013-10-07 DIAGNOSIS — G459 Transient cerebral ischemic attack, unspecified: Secondary | ICD-10-CM

## 2013-10-07 DIAGNOSIS — E119 Type 2 diabetes mellitus without complications: Secondary | ICD-10-CM

## 2013-10-07 DIAGNOSIS — C50919 Malignant neoplasm of unspecified site of unspecified female breast: Secondary | ICD-10-CM

## 2013-10-07 DIAGNOSIS — I10 Essential (primary) hypertension: Secondary | ICD-10-CM

## 2013-10-07 DIAGNOSIS — D649 Anemia, unspecified: Secondary | ICD-10-CM

## 2013-10-07 DIAGNOSIS — E7800 Pure hypercholesterolemia, unspecified: Secondary | ICD-10-CM

## 2013-10-07 NOTE — Progress Notes (Signed)
Subjective:    Patient ID: Angel Floyd, female    DOB: February 26, 1951, 62 y.o.   MRN: [id number]  HPI 62 year old female with past history of hypercholesterolemia, hypertension, breast cancer and previously presumed TIA.   Is s/p  knee surgery for a torn meniscus.  S/p physical therapy.  Seeing Dr Erin Sons.  Is s/p MAKOplasty procedure on her right knee.  She comes in today for a scheduled follow up.   She denies any chest pain or tightness.  No sob.  Breathing stable.  No cough or congestion.  Eating and drinking well.  No significant stomach issues.  Has had EGD and colonoscopy 06/06/10.  Increased stress.  Brother had a stroke.  He is home now.  Also, dealing with other family stressors and family health issues.  She has started back exercising now.  Feels better.  Blood pressures averaging 130/60s.       Past Medical History  Diagnosis Date  . Hypercholesterolemia   . Hypertension   . Diabetes mellitus   . Breast cancer   . Anemia   . TIA (transient ischemic attack)   . History of abnormal Pap smear     class III, required cryosurgery    Current Outpatient Prescriptions on File Prior to Visit  Medication Sig Dispense Refill  . aspirin 81 MG tablet Take 81 mg by mouth daily.      . Calcium-Vitamin D (CALTRATE 600 PLUS-VIT D PO) Take 1 tablet by mouth 2 (two) times daily.      Marland Kitchen esomeprazole (NEXIUM) 40 MG capsule Take 1 capsule (40 mg total) by mouth daily before breakfast.  30 capsule  5  . FeFum-FePoly-FA-B Cmp-C-Biot (INTEGRA PLUS) CAPS Take 1 po daily  30 capsule  5  . fish oil-omega-3 fatty acids 1000 MG capsule Take 1 g by mouth 2 (two) times daily.      Marland Kitchen glucose blood test strip Use as instructed to check blood sugars three times daily. Has One Touch Ultra glucometer. Dx 250.00  100 each  5  . metFORMIN (GLUCOPHAGE) 500 MG tablet Take 1 tablet (500 mg total) by mouth daily.  30 tablet  3  . rosuvastatin (CRESTOR) 10 MG tablet Take 1 tablet (10 mg total) by mouth daily.  30  tablet  5  . telmisartan (MICARDIS) 20 MG tablet Take 1 tablet (20 mg total) by mouth daily.  30 tablet  5   No current facility-administered medications on file prior to visit.    Review of Systems Patient denies any headache, lightheadedness or dizziness.  No sinus congestion or allergies.  No chest pain, tightness or palpitations.  No increased shortness of breath, cough or congestion.  Breathing stable.  No nausea or vomiting.  No abdominal pain or cramping.  Bowels stable.  No constipation or diarrhea.  Doing well s/p knee surgery.   Increased stress.  Feels she is handling things relatively well.  Stress is better.   Desires no further intervention.  Sugars as outlined.  See attached list.  Recent a1c improved.         Objective:   Physical Exam  Filed Vitals:   10/07/13 0922  BP: 130/90  Pulse: 68  Temp: 98.1 F (36.7 C)   Blood pressure recheck:  28/61  62 year old female in no acute distress.   HEENT:  Nares- clear.  Oropharynx - without lesions. NECK:  Supple.  Nontender.  Question of radiation of murmur vs bruit.   HEART:  Appears to be regular.  I/VI systolic murmur.   LUNGS:  No crackles or wheezing audible.  Respirations even and unlabored.  RADIAL PULSE:  Equal bilaterally.  ABDOMEN:  Soft, nontender.  Bowel sounds present and normal.  No audible abdominal bruit.   EXTREMITIES:  No increased edema present.  DP pulses palpable and equal bilaterally.  FEET:  Without lesions.            Assessment & Plan:  MSK.  Is s/p right knee surgery and doing well.  S/p PT.  Follow.      CARDIOVASCULAR.  Stress test 02/10/09 revealed no ischemia.  EF 64%.  EKG as outlined.  No chest pain or tightness. Breathing stable.     HEALTH MAINTENANCE.  Physical 06/16/13.  Colonoscopy 06/06/10 normal.  Recommended follow up colonoscopy in five years.   Mammograms followed at [facility] - oncology.  Last mammogram 09/12/13 - benign.  Bone density scheduled through oncology and pt states bone  density ok.

## 2013-10-07 NOTE — Progress Notes (Signed)
Pre-visit discussion using our clinic review tool. No additional management support is needed unless otherwise documented below in the visit note.  

## 2013-10-08 ENCOUNTER — Encounter: Payer: Self-pay | Admitting: Internal Medicine

## 2013-10-08 NOTE — Assessment & Plan Note (Signed)
Continues follow up at [facility] - oncology.  (Dr Duff).  Mammograms followed at [facility].    

## 2013-10-08 NOTE — Assessment & Plan Note (Signed)
Blood pressure is doing well.  Continue same meds.  Follow metabolic panel.     

## 2013-10-08 NOTE — Assessment & Plan Note (Signed)
Low carb diet and exercise.  Recent a1c elevated - 6.8.  On no medication.  Last a1c 6.6.  On metformin.  Back exercising.   Sees Dr Dorcas Mcmurray for her eyes.

## 2013-10-08 NOTE — Assessment & Plan Note (Signed)
Maintained on aspirin.  Currently asymptomatic.  No reoccurring symptoms.  Consider f/u carotid ultrasound.       

## 2013-10-08 NOTE — Assessment & Plan Note (Signed)
On simvastatin.  Low cholesterol diet and exercise.  Follow lipid panel and liver function.      

## 2013-10-08 NOTE — Assessment & Plan Note (Signed)
Previous GI w/up included colonoscopy 07/08/07 - normal.  EGD 04/02/08 revealed Schatzki's ring with hiatal hernia.  Currently asymptomatic.  Hgb 05/22/12 - wnl.  Follow cbc.  Most recent colonoscopy 06/06/10 - normal.  Recommended follow up colonoscopy in five years.   

## 2013-12-09 ENCOUNTER — Other Ambulatory Visit: Payer: Self-pay | Admitting: *Deleted

## 2013-12-09 MED ORDER — METFORMIN HCL 500 MG PO TABS: 500.0000 mg | ORAL_TABLET | Freq: Every day | ORAL | Status: DC

## 2013-12-17 ENCOUNTER — Ambulatory Visit: Payer: PRIVATE HEALTH INSURANCE | Admitting: Internal Medicine

## 2014-01-07 ENCOUNTER — Other Ambulatory Visit: Payer: Self-pay | Admitting: *Deleted

## 2014-01-07 MED ORDER — ESOMEPRAZOLE MAGNESIUM 40 MG PO CPDR: 40.0000 mg | DELAYED_RELEASE_CAPSULE | Freq: Every day | ORAL | Status: DC

## 2014-02-02 ENCOUNTER — Other Ambulatory Visit: Payer: Self-pay | Admitting: *Deleted

## 2014-02-02 MED ORDER — TELMISARTAN 20 MG PO TABS: 20.0000 mg | ORAL_TABLET | Freq: Every day | ORAL | Status: DC

## 2014-02-26 ENCOUNTER — Other Ambulatory Visit (INDEPENDENT_AMBULATORY_CARE_PROVIDER_SITE_OTHER): Payer: PRIVATE HEALTH INSURANCE

## 2014-02-26 DIAGNOSIS — E119 Type 2 diabetes mellitus without complications: Secondary | ICD-10-CM

## 2014-02-26 DIAGNOSIS — I10 Essential (primary) hypertension: Secondary | ICD-10-CM

## 2014-02-26 DIAGNOSIS — E7800 Pure hypercholesterolemia, unspecified: Secondary | ICD-10-CM

## 2014-02-26 LAB — LIPID PANEL
CHOLESTEROL: 145 mg/dL (ref 0–200)
HDL: 63.1 mg/dL (ref >39.00)
LDL CALC: 72 mg/dL (ref 0–99)
TRIGLYCERIDES: 50 mg/dL (ref 0.0–149.0)
Total CHOL/HDL Ratio: 2
VLDL: 10 mg/dL (ref 0.0–40.0)

## 2014-02-26 LAB — BASIC METABOLIC PANEL
BUN: 15 mg/dL (ref 6–23)
CO2: 28 meq/L (ref 19–32)
CREATININE: 0.7 mg/dL (ref 0.4–1.2)
Calcium: 9.1 mg/dL (ref 8.4–10.5)
Chloride: 106 mEq/L (ref 96–112)
GFR: 90.12 mL/min (ref >60.00)
Glucose, Bld: 133 mg/dL — ABNORMAL HIGH (ref 70–99)
Potassium: 3.8 mEq/L (ref 3.5–5.1)
SODIUM: 142 meq/L (ref 135–145)

## 2014-02-26 LAB — HEPATIC FUNCTION PANEL
ALBUMIN: 4 g/dL (ref 3.5–5.2)
ALK PHOS: 59 U/L (ref 39–117)
ALT: 31 U/L (ref 0–35)
AST: 24 U/L (ref 0–37)
BILIRUBIN DIRECT: 0.1 mg/dL (ref 0.0–0.3)
TOTAL PROTEIN: 6.5 g/dL (ref 6.0–8.3)
Total Bilirubin: 0.7 mg/dL (ref 0.3–1.2)

## 2014-02-26 LAB — HEMOGLOBIN A1C: HEMOGLOBIN A1C: 6.6 % — AB (ref 4.6–6.5)

## 2014-03-01 ENCOUNTER — Encounter: Payer: Self-pay | Admitting: Internal Medicine

## 2014-03-03 ENCOUNTER — Ambulatory Visit (INDEPENDENT_AMBULATORY_CARE_PROVIDER_SITE_OTHER): Payer: PRIVATE HEALTH INSURANCE | Admitting: Internal Medicine

## 2014-03-03 ENCOUNTER — Telehealth: Payer: Self-pay | Admitting: Internal Medicine

## 2014-03-03 ENCOUNTER — Encounter: Payer: Self-pay | Admitting: Internal Medicine

## 2014-03-03 VITALS — BP 110/70 | HR 66 | Temp 98.3°F | Ht 65.0 in | Wt 177.5 lb

## 2014-03-03 DIAGNOSIS — G459 Transient cerebral ischemic attack, unspecified: Secondary | ICD-10-CM

## 2014-03-03 DIAGNOSIS — I10 Essential (primary) hypertension: Secondary | ICD-10-CM

## 2014-03-03 DIAGNOSIS — C50919 Malignant neoplasm of unspecified site of unspecified female breast: Secondary | ICD-10-CM

## 2014-03-03 DIAGNOSIS — E119 Type 2 diabetes mellitus without complications: Secondary | ICD-10-CM

## 2014-03-03 DIAGNOSIS — E7800 Pure hypercholesterolemia, unspecified: Secondary | ICD-10-CM

## 2014-03-03 DIAGNOSIS — D649 Anemia, unspecified: Secondary | ICD-10-CM

## 2014-03-03 NOTE — Assessment & Plan Note (Signed)
On simvastatin.  Low cholesterol diet and exercise.  Follow lipid panel and liver function.      

## 2014-03-03 NOTE — Assessment & Plan Note (Signed)
Blood pressure is doing well.  Continue same meds.  Follow metabolic panel.     

## 2014-03-03 NOTE — Assessment & Plan Note (Signed)
Previous GI w/up included colonoscopy 07/08/07 - normal.  EGD 04/02/08 revealed Schatzki's ring with hiatal hernia.  Currently asymptomatic.  Hgb 05/22/12 - wnl.  Follow [facility].  Most recent colonoscopy 06/06/10 - normal.  Recommended follow up colonoscopy in five years.

## 2014-03-03 NOTE — Assessment & Plan Note (Signed)
Low carb diet and exercise.  Recent a1c elevated - 6.6 (02/26/14).  On metformin.  Back exercising.   Sees Dr Thomasene Ripple for her eyes.

## 2014-03-03 NOTE — Assessment & Plan Note (Signed)
Continues follow up at [facility] - oncology.  (Dr Duff).  Mammograms followed at [facility].    

## 2014-03-03 NOTE — Telephone Encounter (Signed)
Please send pts office note from 03/03/14 to Dr Dina Rich ([facility]).  She sees him for her breast cancer.  Thanks.

## 2014-03-03 NOTE — Progress Notes (Signed)
Subjective:    Patient ID: Angel Floyd, female    DOB: 07-29-1951, 63 y.o.   MRN: [id number]  HPI 63 year old female with past history of hypercholesterolemia, hypertension, breast cancer and previously presumed TIA.   Is s/p  knee surgery for a torn meniscus.  S/p physical therapy.  Seeing Dr Leanor Kail.  Is s/p MAKOplasty procedure on her right knee.  She comes in today for a scheduled follow up.   Knee is doing well.  Has her yearly follow up today.  She denies any chest pain or tightness.  No sob.  Breathing stable.  No cough or congestion.  Eating and drinking well.  No significant stomach issues.  Has had EGD and colonoscopy 06/06/10.  Increased stress.  Brother had a stroke.  He is home now.  She is helping him with his physical therapy.  Her sister also recently had back surgery and has had complications with this.  Doing better.  She is back exercising now.  Feels better.  She is off nexium.  Has been off for four weeks.  No break through symptoms.        Past Medical History  Diagnosis Date  . Hypercholesterolemia   . Hypertension   . Diabetes mellitus   . Breast cancer   . Anemia   . TIA (transient ischemic attack)   . History of abnormal Pap smear     class III, required cryosurgery    Current Outpatient Prescriptions on File Prior to Visit  Medication Sig Dispense Refill  . aspirin 81 MG tablet Take 81 mg by mouth daily.      . Calcium-Vitamin D (CALTRATE 600 PLUS-VIT D PO) Take 1 tablet by mouth 2 (two) times daily.      Marland Kitchen FeFum-FePoly-FA-B Cmp-C-Biot (INTEGRA PLUS) CAPS Take 1 po daily  30 capsule  5  . fish oil-omega-3 fatty acids 1000 MG capsule Take 1 g by mouth 2 (two) times daily.      Marland Kitchen glucose blood test strip Use as instructed to check blood sugars three times daily. Has One Touch Ultra glucometer. Dx 250.00  100 each  5  . metFORMIN (GLUCOPHAGE) 500 MG tablet Take 1 tablet (500 mg total) by mouth daily.  30 tablet  5  . rosuvastatin (CRESTOR) 10 MG tablet Take  1 tablet (10 mg total) by mouth daily.  30 tablet  5  . telmisartan (MICARDIS) 20 MG tablet Take 1 tablet (20 mg total) by mouth daily.  30 tablet  5  . esomeprazole (NEXIUM) 40 MG capsule Take 1 capsule (40 mg total) by mouth daily before breakfast.  30 capsule  5   No current facility-administered medications on file prior to visit.    Review of Systems Patient denies any headache, lightheadedness or dizziness.  No sinus congestion or allergies.  No chest pain, tightness or palpitations.  No increased shortness of breath, cough or congestion.  Breathing stable.  No nausea or vomiting.  No abdominal pain or cramping.  Bowels stable.  No constipation or diarrhea.  Doing well s/p knee surgery.   Increased stress.  Feels she is handling things relatively well.  Stress is better.   Recent a1c improved.       Objective:   Physical Exam  Filed Vitals:   03/03/14 0800  BP: 110/70  Pulse: 66  Temp: 98.3 F (36.8 C)   Blood pressure recheck:  75/61  63 year old female in no acute distress.   HEENT:  Nares- clear.  Oropharynx - without lesions. NECK:  Supple.  Nontender.  No audible carotid bruit.    HEART:  Appears to be regular.  I/VI systolic murmur.   LUNGS:  No crackles or wheezing audible.  Respirations even and unlabored.  RADIAL PULSE:  Equal bilaterally.  ABDOMEN:  Soft, nontender.  Bowel sounds present and normal.  No audible abdominal bruit.   EXTREMITIES:  No increased edema present.  DP pulses palpable and equal bilaterally.  FEET:  Without lesions.            Assessment & Plan:  MSK.  Is s/p right knee surgery and doing well.  S/p PT.  Follow.  Is exercising.    CARDIOVASCULAR.  Stress test 02/10/09 revealed no ischemia.  EF 64%.  EKG as outlined.  No chest pain or tightness. Breathing stable.     HEALTH MAINTENANCE.  Physical 06/16/13.  Colonoscopy 06/06/10 normal.  Recommended follow up colonoscopy in five years.   Mammograms followed at [facility] - oncology.  Last mammogram  09/12/13 - benign.  Bone density scheduled through oncology and pt states bone density ok.   [facility] her [facility]

## 2014-03-03 NOTE — Assessment & Plan Note (Signed)
Maintained on aspirin.  Currently asymptomatic.  No reoccurring symptoms.  Consider f/u carotid ultrasound.

## 2014-03-05 ENCOUNTER — Encounter: Payer: Self-pay | Admitting: Internal Medicine

## 2014-03-05 ENCOUNTER — Other Ambulatory Visit: Payer: Self-pay | Admitting: *Deleted

## 2014-03-05 MED ORDER — INTEGRA PLUS PO CAPS: ORAL_CAPSULE | ORAL | Status: DC

## 2014-03-05 MED ORDER — ROSUVASTATIN CALCIUM 10 MG PO TABS: 10.0000 mg | ORAL_TABLET | Freq: Every day | ORAL | Status: DC

## 2014-03-06 NOTE — Telephone Encounter (Signed)
Noted  

## 2014-03-06 NOTE — Telephone Encounter (Signed)
Labs and office note has been faxed.

## 2014-05-26 ENCOUNTER — Ambulatory Visit: Payer: Self-pay | Admitting: Unknown Physician Specialty

## 2014-06-01 ENCOUNTER — Other Ambulatory Visit: Payer: Self-pay | Admitting: *Deleted

## 2014-06-01 MED ORDER — METFORMIN HCL 500 MG PO TABS: 500.0000 mg | ORAL_TABLET | Freq: Every day | ORAL | Status: DC

## 2014-07-02 ENCOUNTER — Other Ambulatory Visit (INDEPENDENT_AMBULATORY_CARE_PROVIDER_SITE_OTHER): Payer: PRIVATE HEALTH INSURANCE

## 2014-07-02 DIAGNOSIS — E7800 Pure hypercholesterolemia, unspecified: Secondary | ICD-10-CM

## 2014-07-02 DIAGNOSIS — D649 Anemia, unspecified: Secondary | ICD-10-CM

## 2014-07-02 DIAGNOSIS — I10 Essential (primary) hypertension: Secondary | ICD-10-CM

## 2014-07-02 DIAGNOSIS — E119 Type 2 diabetes mellitus without complications: Secondary | ICD-10-CM

## 2014-07-02 LAB — LIPID PANEL
CHOL/HDL RATIO: 3
Cholesterol: 151 mg/dL (ref 0–200)
HDL: 55.1 mg/dL (ref >39.00)
LDL CALC: 82 mg/dL (ref 0–99)
NonHDL: 95.9
Triglycerides: 71 mg/dL (ref 0.0–149.0)
VLDL: 14.2 mg/dL (ref 0.0–40.0)

## 2014-07-02 LAB — HEPATIC FUNCTION PANEL
ALT: 39 U/L — ABNORMAL HIGH (ref 0–35)
AST: 24 U/L (ref 0–37)
Albumin: 4.1 g/dL (ref 3.5–5.2)
Alkaline Phosphatase: 62 U/L (ref 39–117)
BILIRUBIN TOTAL: 0.7 mg/dL (ref 0.2–1.2)
Bilirubin, Direct: 0.2 mg/dL (ref 0.0–0.3)
Total Protein: 6.3 g/dL (ref 6.0–8.3)

## 2014-07-02 LAB — CBC WITH DIFFERENTIAL/PLATELET
BASOS ABS: 0 10*3/uL (ref 0.0–0.1)
BASOS PCT: 0.5 % (ref 0.0–3.0)
EOS ABS: 0.2 10*3/uL (ref 0.0–0.7)
Eosinophils Relative: 3.3 % (ref 0.0–5.0)
HEMATOCRIT: 38.5 % (ref 36.0–46.0)
HEMOGLOBIN: 13 g/dL (ref 12.0–15.0)
LYMPHS ABS: 1.4 10*3/uL (ref 0.7–4.0)
Lymphocytes Relative: 27.1 % (ref 12.0–46.0)
MCHC: 33.6 g/dL (ref 30.0–36.0)
MCV: 90.6 fl (ref 78.0–100.0)
Monocytes Absolute: 0.3 10*3/uL (ref 0.1–1.0)
Monocytes Relative: 6.6 % (ref 3.0–12.0)
Neutro Abs: 3.2 10*3/uL (ref 1.4–7.7)
Neutrophils Relative %: 62.5 % (ref 43.0–77.0)
Platelets: 196 10*3/uL (ref 150.0–400.0)
RBC: 4.25 Mil/uL (ref 3.87–5.11)
RDW: 13.4 % (ref 11.5–15.5)
WBC: 5.1 10*3/uL (ref 4.0–10.5)

## 2014-07-02 LAB — BASIC METABOLIC PANEL
BUN: 24 mg/dL — AB (ref 6–23)
CHLORIDE: 104 meq/L (ref 96–112)
CO2: 29 mEq/L (ref 19–32)
Calcium: 9.5 mg/dL (ref 8.4–10.5)
Creatinine, Ser: 0.7 mg/dL (ref 0.4–1.2)
GFR: 85.76 mL/min (ref >60.00)
GLUCOSE: 125 mg/dL — AB (ref 70–99)
POTASSIUM: 4.1 meq/L (ref 3.5–5.1)
SODIUM: 140 meq/L (ref 135–145)

## 2014-07-02 LAB — MICROALBUMIN / CREATININE URINE RATIO
Creatinine,U: 28 mg/dL
Microalb Creat Ratio: 1.1 mg/g (ref 0.0–30.0)
Microalb, Ur: 0.3 mg/dL (ref 0.0–1.9)

## 2014-07-02 LAB — HEMOGLOBIN A1C: Hgb A1c MFr Bld: 6.6 % — ABNORMAL HIGH (ref 4.6–6.5)

## 2014-07-02 LAB — TSH: TSH: 1.74 u[IU]/mL (ref 0.35–4.50)

## 2014-07-03 ENCOUNTER — Encounter: Payer: Self-pay | Admitting: Internal Medicine

## 2014-07-03 LAB — HM DIABETES EYE EXAM

## 2014-07-06 ENCOUNTER — Encounter: Payer: Self-pay | Admitting: Internal Medicine

## 2014-07-06 ENCOUNTER — Other Ambulatory Visit (HOSPITAL_COMMUNITY)
Admission: RE | Admit: 2014-07-06 | Discharge: 2014-07-06 | Disposition: A | Discharge status: Home / Self Care | Payer: PRIVATE HEALTH INSURANCE | Source: Ambulatory Visit | Attending: Internal Medicine | Admitting: Internal Medicine

## 2014-07-06 ENCOUNTER — Ambulatory Visit (INDEPENDENT_AMBULATORY_CARE_PROVIDER_SITE_OTHER): Payer: PRIVATE HEALTH INSURANCE | Admitting: Internal Medicine

## 2014-07-06 VITALS — BP 122/80 | HR 65 | Temp 97.8°F | Ht 65.0 in | Wt 179.0 lb

## 2014-07-06 DIAGNOSIS — G459 Transient cerebral ischemic attack, unspecified: Secondary | ICD-10-CM

## 2014-07-06 DIAGNOSIS — Z1151 Encounter for screening for human papillomavirus (HPV): Secondary | ICD-10-CM | POA: Diagnosis present

## 2014-07-06 DIAGNOSIS — M25552 Pain in left hip: Secondary | ICD-10-CM

## 2014-07-06 DIAGNOSIS — Z1211 Encounter for screening for malignant neoplasm of colon: Secondary | ICD-10-CM

## 2014-07-06 DIAGNOSIS — C50919 Malignant neoplasm of unspecified site of unspecified female breast: Secondary | ICD-10-CM

## 2014-07-06 DIAGNOSIS — E7800 Pure hypercholesterolemia, unspecified: Secondary | ICD-10-CM

## 2014-07-06 DIAGNOSIS — Z01419 Encounter for gynecological examination (general) (routine) without abnormal findings: Secondary | ICD-10-CM | POA: Insufficient documentation

## 2014-07-06 DIAGNOSIS — M79605 Pain in left leg: Secondary | ICD-10-CM

## 2014-07-06 DIAGNOSIS — I10 Essential (primary) hypertension: Secondary | ICD-10-CM

## 2014-07-06 DIAGNOSIS — M25559 Pain in unspecified hip: Secondary | ICD-10-CM

## 2014-07-06 DIAGNOSIS — D649 Anemia, unspecified: Secondary | ICD-10-CM

## 2014-07-06 DIAGNOSIS — Z124 Encounter for screening for malignant neoplasm of cervix: Secondary | ICD-10-CM

## 2014-07-06 DIAGNOSIS — E119 Type 2 diabetes mellitus without complications: Secondary | ICD-10-CM

## 2014-07-06 DIAGNOSIS — M79609 Pain in unspecified limb: Secondary | ICD-10-CM

## 2014-07-06 DIAGNOSIS — R198 Other specified symptoms and signs involving the digestive system and abdomen: Secondary | ICD-10-CM

## 2014-07-06 LAB — HM DIABETES FOOT EXAM

## 2014-07-06 NOTE — Progress Notes (Signed)
Subjective:    Patient ID: Angel Floyd, female    DOB: 1951-10-27, 63 y.o.   MRN: [id number]  HPI 63 year old female with past history of hypercholesterolemia, hypertension, breast cancer and previously presumed TIA.   Is s/p  knee surgery for a torn meniscus.  S/p physical therapy.  Seeing Dr Leanor Kail.  Is s/p MAKOplasty procedure on her right knee.  She comes in today to follow up on these issues as well as for a complete physical exam.   She has been having pain and problems with her left knee.  Had MRI.  Diagnosed with a stress fracture.  Stopped exercising 2 weeks ago.  States she is having increased pain in her left buttock - sore to touch.  Did a lot of walking a couple of weeks ago.  Pain extends into her leg.  States her a while to get comfortable in bed.  She has been trying some stretches.  She denies any chest pain or tightness.  No sob.  Breathing stable.  No cough or congestion.  Eating and drinking well.  No significant stomach issues.  Has had EGD and colonoscopy 06/06/10.  Increased stress.  Feels she is coping relatively well.  She did recently notice floaters and flashes of light in her right eye.  Saw Dr Thomasene Ripple.  He is following.  Still will have occasional flares with her bowels.  Loose stool intermittently.       Past Medical History  Diagnosis Date  . Hypercholesterolemia   . Hypertension   . Diabetes mellitus   . Breast cancer   . Anemia   . TIA (transient ischemic attack)   . History of abnormal Pap smear     class III, required cryosurgery    Current Outpatient Prescriptions on File Prior to Visit  Medication Sig Dispense Refill  . aspirin 81 MG tablet Take 81 mg by mouth daily.      . Calcium-Vitamin D (CALTRATE 600 PLUS-VIT D PO) Take 2 tablets by mouth daily.       Marland Kitchen esomeprazole (NEXIUM) 40 MG capsule Take 1 capsule (40 mg total) by mouth daily before breakfast.  30 capsule  5  . FeFum-FePoly-FA-B Cmp-C-Biot (INTEGRA PLUS) CAPS Take 1 po daily   30 capsule  4  . fish oil-omega-3 fatty acids 1000 MG capsule Take 2 g by mouth daily.       Marland Kitchen glucose blood test strip Use as instructed to check blood sugars three times daily. Has One Touch Ultra glucometer. Dx 250.00  100 each  5  . metFORMIN (GLUCOPHAGE) 500 MG tablet Take 1 tablet (500 mg total) by mouth daily.  30 tablet  5  . rosuvastatin (CRESTOR) 10 MG tablet Take 1 tablet (10 mg total) by mouth daily.  30 tablet  4  . telmisartan (MICARDIS) 20 MG tablet Take 1 tablet (20 mg total) by mouth daily.  30 tablet  5   No current facility-administered medications on file prior to visit.    Review of Systems Patient denies any headache, lightheadedness or dizziness.  Floaters and flashes of light as outlined.  No sinus congestion or allergies.  No chest pain, tightness or palpitations.  No increased shortness of breath, cough or congestion.  Breathing stable.  No nausea or vomiting.  No abdominal pain or cramping.  Still with some bowel flares intermittently as outlined.  No constipation.  Increased stress.  Feels she is handling things relatively well.  Having some increased pain  in her left buttock and left leg.  See above.        Objective:   Physical Exam  Filed Vitals:   07/06/14 0831  BP: 122/80  Pulse: 65  Temp: 97.8 F (36.6 C)   Blood pressure recheck:  53/26  63 year old female in no acute distress.   HEENT:  Nares- clear.  Oropharynx - without lesions. NECK:  Supple.  Nontender.  No audible bruit.  HEART:  Appears to be regular. LUNGS:  No crackles or wheezing audible.  Respirations even and unlabored.  RADIAL PULSE:  Equal bilaterally.    BREASTS:  No nipple discharge or nipple retraction present.  Could not appreciate any distinct nodules or axillary adenopathy.  ABDOMEN:  Soft, nontender.  Bowel sounds present and normal.  No audible abdominal bruit.  GU:  Normal external genitalia.  Vaginal vault without lesions.  Cervix identified.  Pap performed. Could not  appreciate any adnexal masses or tenderness.   RECTAL:  Heme negative.   EXTREMITIES:  No increased edema present.  DP pulses palpable and equal bilaterally.      FEET:  Without lesions.   MSK:  Increased pain with palpation - left lateral buttock.            Assessment & Plan:  CARDIOVASCULAR.  Stress test 02/10/09 revealed no ischemia.  EF 64%.  EKG as outlined.  No chest pain or tightness. Breathing stable.     HEALTH MAINTENANCE.  Physical today.  Colonoscopy 06/06/10 normal.  Recommended follow up colonoscopy in five years.  With the continued bowel change, will refer to GI now.   Mammograms followed at [facility] - oncology.  Last mammogram 09/12/13 - benign.  Scheduled f/u mammogram in 10/15.  Bone density scheduled through oncology and pt states bone density ok.

## 2014-07-06 NOTE — Progress Notes (Signed)
Pre visit review using our clinic review tool, if applicable. No additional management support is needed unless otherwise documented below in the visit note. 

## 2014-07-07 LAB — CYTOLOGY - PAP

## 2014-07-08 ENCOUNTER — Encounter: Payer: Self-pay | Admitting: Internal Medicine

## 2014-07-12 ENCOUNTER — Encounter: Payer: Self-pay | Admitting: Internal Medicine

## 2014-07-12 DIAGNOSIS — R198 Other specified symptoms and signs involving the digestive system and abdomen: Secondary | ICD-10-CM | POA: Insufficient documentation

## 2014-07-12 DIAGNOSIS — M79605 Pain in left leg: Secondary | ICD-10-CM | POA: Insufficient documentation

## 2014-07-12 NOTE — Assessment & Plan Note (Signed)
Low carb diet and exercise.  Recent a1c - 6.6 (07/02/14).  On metformin.  Sees Dr Thomasene Ripple for her eyes.

## 2014-07-12 NOTE — Assessment & Plan Note (Signed)
Blood pressure is doing well.  Continue same meds.  Follow metabolic panel.

## 2014-07-12 NOTE — Assessment & Plan Note (Signed)
Maintained on aspirin.  Currently asymptomatic.  No reoccurring symptoms.

## 2014-07-12 NOTE — Assessment & Plan Note (Signed)
Previous GI w/up included colonoscopy 07/08/07 - normal.  EGD 04/02/08 revealed Schatzki's ring with hiatal hernia.  Currently asymptomatic.   Most recent colonoscopy 06/06/10 - normal.  Recommended follow up colonoscopy in five years.  Follow [facility].

## 2014-07-12 NOTE — Assessment & Plan Note (Signed)
On simvastatin.  Low cholesterol diet and exercise.  Follow lipid panel and liver function.      

## 2014-07-12 NOTE — Assessment & Plan Note (Signed)
With bowel changes and history of breast cancer, will refer to GI for further evaluation and question of need for f/u colonoscopy.  Last colonoscopy - 2011.

## 2014-07-12 NOTE — Assessment & Plan Note (Signed)
Increased pain in her left buttock and left leg.  Pain reproducible on exam.  Question of bursitis.  Will refer to ortho for further evaluation and question of need for injection.

## 2014-07-12 NOTE — Assessment & Plan Note (Signed)
Continues follow up at [facility] - oncology.  (Dr Riki Sheer).  Mammograms followed at [facility].

## 2014-07-14 ENCOUNTER — Encounter: Payer: Self-pay | Admitting: Internal Medicine

## 2014-07-30 ENCOUNTER — Ambulatory Visit: Payer: Self-pay | Admitting: Gastroenterology

## 2014-07-30 LAB — HM COLONOSCOPY

## 2014-07-31 LAB — PATHOLOGY REPORT

## 2014-08-03 ENCOUNTER — Ambulatory Visit: Payer: Self-pay | Admitting: Gastroenterology

## 2014-08-06 ENCOUNTER — Encounter: Payer: Self-pay | Admitting: Internal Medicine

## 2014-08-13 ENCOUNTER — Encounter: Payer: Self-pay | Admitting: Internal Medicine

## 2014-08-24 ENCOUNTER — Encounter: Payer: Self-pay | Admitting: Internal Medicine

## 2014-08-25 LAB — HM DIABETES EYE EXAM

## 2014-09-25 LAB — HM MAMMOGRAPHY

## 2014-10-07 ENCOUNTER — Other Ambulatory Visit: Payer: Self-pay | Admitting: *Deleted

## 2014-10-07 MED ORDER — ESOMEPRAZOLE MAGNESIUM 40 MG PO CPDR: 40.0000 mg | DELAYED_RELEASE_CAPSULE | Freq: Every day | ORAL | Status: DC

## 2014-10-07 MED ORDER — TELMISARTAN 20 MG PO TABS: 20.0000 mg | ORAL_TABLET | Freq: Every day | ORAL | Status: DC

## 2014-10-09 ENCOUNTER — Ambulatory Visit: Payer: Self-pay | Admitting: Unknown Physician Specialty

## 2014-10-26 ENCOUNTER — Ambulatory Visit (INDEPENDENT_AMBULATORY_CARE_PROVIDER_SITE_OTHER): Payer: PRIVATE HEALTH INSURANCE | Admitting: Internal Medicine

## 2014-10-26 ENCOUNTER — Encounter: Payer: Self-pay | Admitting: Internal Medicine

## 2014-10-26 VITALS — BP 120/64 | HR 74 | Temp 98.2°F | Wt 174.0 lb

## 2014-10-26 DIAGNOSIS — J069 Acute upper respiratory infection, unspecified: Secondary | ICD-10-CM

## 2014-10-26 MED ORDER — HYDROCODONE-HOMATROPINE 5-1.5 MG/5ML PO SYRP: 5.0000 mL | ORAL_SOLUTION | Freq: Three times a day (TID) | ORAL | Status: DC | PRN

## 2014-10-26 MED ORDER — AZITHROMYCIN 250 MG PO TABS: ORAL_TABLET | ORAL | Status: DC

## 2014-10-26 NOTE — Patient Instructions (Signed)
Upper Respiratory Infection, Adult An upper respiratory infection (URI) is also sometimes known as the common cold. The upper respiratory tract includes the nose, sinuses, throat, trachea, and bronchi. Bronchi are the airways leading to the lungs. Most people improve within 1 week, but symptoms can last up to 2 weeks. A residual cough may last even longer.  CAUSES Many different viruses can infect the tissues lining the upper respiratory tract. The tissues become irritated and inflamed and often become very moist. Mucus production is also common. A cold is contagious. You can easily spread the virus to others by oral contact. This includes kissing, sharing a glass, coughing, or sneezing. Touching your mouth or nose and then touching a surface, which is then touched by another person, can also spread the virus. SYMPTOMS  Symptoms typically develop 1 to 3 days after you come in contact with a cold virus. Symptoms vary from person to person. They may include:  Runny nose.  Sneezing.  Nasal congestion.  Sinus irritation.  Sore throat.  Loss of voice (laryngitis).  Cough.  Fatigue.  Muscle aches.  Loss of appetite.  Headache.  Low-grade fever. DIAGNOSIS  You might diagnose your own cold based on familiar symptoms, since most people get a cold 2 to 3 times a year. Your caregiver can confirm this based on your exam. Most importantly, your caregiver can check that your symptoms are not due to another disease such as strep throat, sinusitis, pneumonia, asthma, or epiglottitis. Blood tests, throat tests, and X-rays are not necessary to diagnose a common cold, but they may sometimes be helpful in excluding other more serious diseases. Your caregiver will decide if any further tests are required. RISKS AND COMPLICATIONS  You may be at risk for a more severe case of the common cold if you smoke cigarettes, have chronic heart disease (such as heart failure) or lung disease (such as asthma), or if  you have a weakened immune system. The very young and very old are also at risk for more serious infections. Bacterial sinusitis, middle ear infections, and bacterial pneumonia can complicate the common cold. The common cold can worsen asthma and chronic obstructive pulmonary disease (COPD). Sometimes, these complications can require emergency medical care and may be life-threatening. PREVENTION  The best way to protect against getting a cold is to practice good hygiene. Avoid oral or hand contact with people with cold symptoms. Wash your hands often if contact occurs. There is no clear evidence that vitamin C, vitamin E, echinacea, or exercise reduces the chance of developing a cold. However, it is always recommended to get plenty of rest and practice good nutrition. TREATMENT  Treatment is directed at relieving symptoms. There is no cure. Antibiotics are not effective, because the infection is caused by a virus, not by bacteria. Treatment may include:  Increased fluid intake. Sports drinks offer valuable electrolytes, sugars, and fluids.  Breathing heated mist or steam (vaporizer or shower).  Eating chicken soup or other clear broths, and maintaining good nutrition.  Getting plenty of rest.  Using gargles or lozenges for comfort.  Controlling fevers with ibuprofen or acetaminophen as directed by your caregiver.  Increasing usage of your inhaler if you have asthma. Zinc gel and zinc lozenges, taken in the first 24 hours of the common cold, can shorten the duration and lessen the severity of symptoms. Pain medicines may help with fever, muscle aches, and throat pain. A variety of non-prescription medicines are available to treat congestion and runny nose. Your caregiver   can make recommendations and may suggest nasal or lung inhalers for other symptoms.  HOME CARE INSTRUCTIONS   Only take over-the-counter or prescription medicines for pain, discomfort, or fever as directed by your  caregiver.  Use a warm mist humidifier or inhale steam from a shower to increase air moisture. This may keep secretions moist and make it easier to breathe.  Drink enough water and fluids to keep your urine clear or pale yellow.  Rest as needed.  Return to work when your temperature has returned to normal or as your caregiver advises. You may need to stay home longer to avoid infecting others. You can also use a face mask and careful hand washing to prevent spread of the virus. SEEK MEDICAL CARE IF:   After the first few days, you feel you are getting worse rather than better.  You need your caregiver's advice about medicines to control symptoms.  You develop chills, worsening shortness of breath, or brown or red sputum. These may be signs of pneumonia.  You develop yellow or brown nasal discharge or pain in the face, especially when you bend forward. These may be signs of sinusitis.  You develop a fever, swollen neck glands, pain with swallowing, or white areas in the back of your throat. These may be signs of strep throat. SEEK IMMEDIATE MEDICAL CARE IF:   You have a fever.  You develop severe or persistent headache, ear pain, sinus pain, or chest pain.  You develop wheezing, a prolonged cough, cough up blood, or have a change in your usual mucus (if you have chronic lung disease).  You develop sore muscles or a stiff neck. Document Released: 05/02/2001 Document Revised: 01/29/2012 Document Reviewed: 02/11/2014 Exit[employer] Patient Information 2015 [employer]. This information is not intended to replace advice given to you by your health care provider. Make sure you discuss any questions you have with your health care provider.  

## 2014-10-26 NOTE — Progress Notes (Signed)
Pre visit review using our clinic review tool, if applicable. No additional management support is needed unless otherwise documented below in the visit note. 

## 2014-10-26 NOTE — Progress Notes (Signed)
HPI  Pt presents to the clinic today with c/o cough and chest congestion. She reports this started 1 week ago. The cough is mostly non productive but occassionally she will cough up yellow mucous at times. The cough is worse at night. She has had some associated nasal congestion. She denies fever, chills or body aches. She has tried mucinex, nasal spray and tussin without much relief. She has no history of allergies or breathing problems. She has not had sick contacts that she is aware of.  Review of Systems      Past Medical History  Diagnosis Date  . Hypercholesterolemia   . Hypertension   . Diabetes mellitus   . Breast cancer   . Anemia   . TIA (transient ischemic attack)   . History of abnormal Pap smear     class III, required cryosurgery    Family History  Problem Relation Age of Onset  . Lung cancer Father   . Alcohol abuse Father   . Cancer Father     lung  . CVA Mother   . Colon cancer Mother   . Alcohol abuse Mother   . Cancer Mother     colon  . Stroke Mother   . Colon cancer Sister   . Diabetes Maternal Grandfather     History   Social History  . Marital Status: Married    Spouse Name: N/A    Number of Children: 2  . Years of Education: N/A   Occupational History  . Not on file.   Social History Main Topics  . Smoking status: Never Smoker   . Smokeless tobacco: Never Used  . Alcohol Use: No  . Drug Use: No  . Sexual Activity: Not on file   Other Topics Concern  . Not on file   Social History Narrative    Allergies  Allergen Reactions  . Aggrenox [Aspirin-Dipyridamole Er]      Constitutional: Positive headache, fatigue. Denies fever or abrupt weight changes.  HEENT:  Positive nasal congestion, sore throat. Denies eye redness, eye pain, pressure behind the eyes, facial pain, ear pain, ringing in the ears, wax buildup, runny nose or bloody nose. Respiratory: Positive cough. Denies difficulty breathing or shortness of breath.   Cardiovascular: Denies chest pain, chest tightness, palpitations or swelling in the hands or feet.   No other specific complaints in a complete review of systems (except as listed in HPI above).  Objective:   BP 120/64 mmHg  Pulse 74  Temp(Src) 98.2 F (36.8 C) (Oral)  Wt 174 lb (78.926 kg)  SpO2 98%   Wt Readings from Last 3 Encounters:  10/26/14 174 lb (78.926 kg)  07/06/14 179 lb (81.194 kg)  03/03/14 177 lb 8 oz (80.513 kg)     General: Appears her stated age, ill appearing in NAD. HEENT: Head: normal shape and size; Ears: Tm's gray and intact, normal light reflex; Nose: mucosa pink and moist, septum midline; Throat/Mouth:  Teeth present, mucosa erythematous and moist, no exudate noted, no lesions or ulcerations noted. No adenopathy noted. Cardiovascular: Normal rate and rhythm. S1,S2 noted.  No murmur, rubs or gallops noted.  Pulmonary/Chest: Normal effort and positive vesicular breath sounds. No respiratory distress. No wheezes, rales or ronchi noted.      Assessment & Plan:   Upper Respiratory Infection:  Get some rest and drink plenty of water Do salt water gargles/Ibuprofen for the sore throat eRx for Azithromax x 5 days Rx for Hycodan cough syrup  RTC as  needed or if symptoms persist.

## 2014-10-27 ENCOUNTER — Encounter: Payer: Self-pay | Admitting: *Deleted

## 2014-10-28 ENCOUNTER — Other Ambulatory Visit: Payer: Self-pay | Admitting: *Deleted

## 2014-10-28 MED ORDER — ROSUVASTATIN CALCIUM 10 MG PO TABS: 10.0000 mg | ORAL_TABLET | Freq: Every day | ORAL | Status: DC

## 2014-11-04 ENCOUNTER — Ambulatory Visit: Payer: PRIVATE HEALTH INSURANCE | Admitting: Internal Medicine

## 2014-11-10 ENCOUNTER — Other Ambulatory Visit: Payer: Self-pay | Admitting: *Deleted

## 2014-11-10 MED ORDER — INTEGRA PLUS PO CAPS: ORAL_CAPSULE | ORAL | Status: DC

## 2014-11-23 ENCOUNTER — Encounter: Payer: Self-pay | Admitting: *Deleted

## 2014-12-08 ENCOUNTER — Ambulatory Visit (INDEPENDENT_AMBULATORY_CARE_PROVIDER_SITE_OTHER): Payer: PRIVATE HEALTH INSURANCE | Admitting: Internal Medicine

## 2014-12-08 ENCOUNTER — Encounter: Payer: Self-pay | Admitting: Internal Medicine

## 2014-12-08 VITALS — BP 124/70 | HR 73 | Temp 98.4°F | Ht 65.0 in | Wt 174.8 lb

## 2014-12-08 DIAGNOSIS — E7800 Pure hypercholesterolemia, unspecified: Secondary | ICD-10-CM

## 2014-12-08 DIAGNOSIS — G459 Transient cerebral ischemic attack, unspecified: Secondary | ICD-10-CM

## 2014-12-08 DIAGNOSIS — E119 Type 2 diabetes mellitus without complications: Secondary | ICD-10-CM

## 2014-12-08 DIAGNOSIS — I10 Essential (primary) hypertension: Secondary | ICD-10-CM

## 2014-12-08 DIAGNOSIS — R0989 Other specified symptoms and signs involving the circulatory and respiratory systems: Secondary | ICD-10-CM

## 2014-12-08 DIAGNOSIS — C50919 Malignant neoplasm of unspecified site of unspecified female breast: Secondary | ICD-10-CM

## 2014-12-08 NOTE — Progress Notes (Signed)
Pre visit review using our clinic review tool, if applicable. No additional management support is needed unless otherwise documented below in the visit note. 

## 2014-12-12 ENCOUNTER — Encounter: Payer: Self-pay | Admitting: Internal Medicine

## 2014-12-12 DIAGNOSIS — R0989 Other specified symptoms and signs involving the circulatory and respiratory systems: Secondary | ICD-10-CM | POA: Insufficient documentation

## 2014-12-12 NOTE — Progress Notes (Signed)
Subjective:    Patient ID: Angel Floyd, female    DOB: 1951/08/21, 64 y.o.   MRN: [id number]  HPI 64 year old female with past history of hypercholesterolemia, hypertension, breast cancer and previously presumed TIA.   Is s/p  knee surgery for a torn meniscus.  S/p physical therapy.  Seeing Dr Leanor Kail.  Is s/p MAKOplasty procedure on her right knee.  She comes in today for a scheduled follow up.  She denies any chest pain or tightness.  No sob.  Breathing stable.  No cough or congestion.  Eating and drinking well.  No stomach issues.  Has had EGD and colonoscopy 06/06/10.  Increased stress.  Feels she is coping relatively well.  States am sugars averaging 125-132.       Past Medical History  Diagnosis Date  . Hypercholesterolemia   . Hypertension   . Diabetes mellitus   . Breast cancer   . Anemia   . TIA (transient ischemic attack)   . History of abnormal Pap smear     class III, required cryosurgery    Current Outpatient Prescriptions on File Prior to Visit  Medication Sig Dispense Refill  . aspirin 81 MG tablet Take 81 mg by mouth daily.    . Calcium-Vitamin D (CALTRATE 600 PLUS-VIT D PO) Take 2 tablets by mouth daily.     Marland Kitchen esomeprazole (NEXIUM) 40 MG capsule Take 1 capsule (40 mg total) by mouth daily before breakfast. 30 capsule 5  . FeFum-FePoly-FA-B Cmp-C-Biot (INTEGRA PLUS) CAPS Take 1 po daily 30 capsule 5  . fish oil-omega-3 fatty acids 1000 MG capsule Take 2 g by mouth daily.     Marland Kitchen glucose blood test strip Use as instructed to check blood sugars three times daily. Has One Touch Ultra glucometer. Dx 250.00 100 each 5  . metFORMIN (GLUCOPHAGE) 500 MG tablet Take 1 tablet (500 mg total) by mouth daily. 30 tablet 5  . rosuvastatin (CRESTOR) 10 MG tablet Take 1 tablet (10 mg total) by mouth daily. 30 tablet 4  . telmisartan (MICARDIS) 20 MG tablet Take 1 tablet (20 mg total) by mouth daily. 30 tablet 5   No current facility-administered medications on file prior to  visit.    Review of Systems Patient denies any headache, lightheadedness or dizziness.  No sinus congestion or allergies.  No chest pain, tightness or palpitations.  No increased shortness of breath, cough or congestion.  Breathing stable.  No nausea or vomiting.  No abdominal pain or cramping.  Bowels stable.  Increased stress.  Feels she is handling things relatively well.  Knees overall doing ok.        Objective:   Physical Exam  Filed Vitals:   12/08/14 1127  BP: 124/70  Pulse: 73  Temp: 98.4 F (36.9 C)   Blood pressure recheck:  4/32  64 year old female in no acute distress.   HEENT:  Nares- clear.  Oropharynx - without lesions. NECK:  Supple.  Nontender.  No audible bruit.  HEART:  Appears to be regular. LUNGS:  No crackles or wheezing audible.  Respirations even and unlabored.  RADIAL PULSE:  Equal bilaterally. ABDOMEN:  Soft, nontender.  Bowel sounds present and normal.  No audible abdominal bruit.   EXTREMITIES:  No increased edema present.  DP pulses palpable and equal bilaterally.      FEET:  Without lesions.          Assessment & Plan:  1. Transient cerebral ischemia, unspecified transient cerebral  ischemia type Maintained on aspirin.  Doing well.    2. Essential hypertension Blood pressure doing well.  Same medication regimen.  Follow pressures.  Check met b.    3. Type 2 diabetes mellitus without complication Sugars as outlined.  Diet and exercise.  Follow met b and a1c.  Keep up to date with eye checks.    4. Breast cancer, unspecified laterality continues to follow up at [facility] - oncology - Dr Riki Sheer.  Mammograms followed at [facility]  5. Hypercholesterolemia Low cholesterol diet and exercise.  On crestor.  Follow lipid panel and liver function tests.    6. Carotid bruit, unspecified laterality - Ambulatory referral to Vascular Surgery for carotid ultrasound.    7. CARDIOVASCULAR.  Stress test 02/10/09 revealed no ischemia.  EF 64%.  EKG as outlined.  No  chest pain or tightness. Breathing stable.     HEALTH MAINTENANCE.  Physical 07/06/14.  Colonoscopy 06/06/10 normal.  Recommended follow up colonoscopy in five years.  Was referred to GI last visit.  Had colonoscopy 07/30/14 - normal.  Recommended f/u colonoscopy in 10 years.   Mammograms followed at [facility] - oncology.  Last mammogram 09/25/14 - ok.   Bone density scheduled through oncology and pt states bone density ok.

## 2014-12-15 ENCOUNTER — Other Ambulatory Visit (INDEPENDENT_AMBULATORY_CARE_PROVIDER_SITE_OTHER): Payer: PRIVATE HEALTH INSURANCE

## 2014-12-15 DIAGNOSIS — I10 Essential (primary) hypertension: Secondary | ICD-10-CM

## 2014-12-15 DIAGNOSIS — E119 Type 2 diabetes mellitus without complications: Secondary | ICD-10-CM

## 2014-12-15 DIAGNOSIS — E7800 Pure hypercholesterolemia, unspecified: Secondary | ICD-10-CM

## 2014-12-15 LAB — BASIC METABOLIC PANEL
BUN: 15 mg/dL (ref 6–23)
CHLORIDE: 107 meq/L (ref 96–112)
CO2: 27 mEq/L (ref 19–32)
CREATININE: 0.71 mg/dL (ref 0.40–1.20)
Calcium: 9.1 mg/dL (ref 8.4–10.5)
GFR: 88.42 mL/min (ref >60.00)
Glucose, Bld: 144 mg/dL — ABNORMAL HIGH (ref 70–99)
Potassium: 3.6 mEq/L (ref 3.5–5.1)
Sodium: 143 mEq/L (ref 135–145)

## 2014-12-15 LAB — HEPATIC FUNCTION PANEL
ALT: 21 U/L (ref 0–35)
AST: 20 U/L (ref 0–37)
Albumin: 4 g/dL (ref 3.5–5.2)
Alkaline Phosphatase: 66 U/L (ref 39–117)
BILIRUBIN DIRECT: 0.1 mg/dL (ref 0.0–0.3)
BILIRUBIN TOTAL: 0.6 mg/dL (ref 0.2–1.2)
Total Protein: 6.3 g/dL (ref 6.0–8.3)

## 2014-12-15 LAB — LIPID PANEL
CHOLESTEROL: 150 mg/dL (ref 0–200)
HDL: 53.1 mg/dL (ref >39.00)
LDL CALC: 76 mg/dL (ref 0–99)
NONHDL: 96.9
Total CHOL/HDL Ratio: 3
Triglycerides: 104 mg/dL (ref 0.0–149.0)
VLDL: 20.8 mg/dL (ref 0.0–40.0)

## 2014-12-15 LAB — HEMOGLOBIN A1C: HEMOGLOBIN A1C: 7.1 % — AB (ref 4.6–6.5)

## 2014-12-16 ENCOUNTER — Other Ambulatory Visit: Payer: Self-pay | Admitting: *Deleted

## 2014-12-16 ENCOUNTER — Encounter: Payer: Self-pay | Admitting: Internal Medicine

## 2014-12-16 ENCOUNTER — Other Ambulatory Visit (HOSPITAL_COMMUNITY): Payer: Self-pay | Admitting: Cardiology

## 2014-12-16 ENCOUNTER — Telehealth: Payer: Self-pay | Admitting: Internal Medicine

## 2014-12-16 DIAGNOSIS — R0989 Other specified symptoms and signs involving the circulatory and respiratory systems: Secondary | ICD-10-CM

## 2014-12-16 NOTE — Telephone Encounter (Signed)
Pt notified of labs.  Needs a f/u appt with me in 3-4 weeks to discuss blood sugars.    Please schedule and contact her with an appt date and time.  Thanks.

## 2014-12-18 ENCOUNTER — Encounter (INDEPENDENT_AMBULATORY_CARE_PROVIDER_SITE_OTHER): Payer: PRIVATE HEALTH INSURANCE

## 2014-12-18 DIAGNOSIS — I6523 Occlusion and stenosis of bilateral carotid arteries: Secondary | ICD-10-CM

## 2014-12-18 DIAGNOSIS — R0989 Other specified symptoms and signs involving the circulatory and respiratory systems: Secondary | ICD-10-CM

## 2014-12-22 ENCOUNTER — Encounter: Payer: Self-pay | Admitting: Internal Medicine

## 2015-01-08 ENCOUNTER — Other Ambulatory Visit: Payer: Self-pay | Admitting: *Deleted

## 2015-01-08 MED ORDER — GLUCOSE BLOOD VI STRP: ORAL_STRIP | Status: DC

## 2015-01-11 ENCOUNTER — Encounter: Payer: Self-pay | Admitting: Internal Medicine

## 2015-01-15 ENCOUNTER — Ambulatory Visit (INDEPENDENT_AMBULATORY_CARE_PROVIDER_SITE_OTHER): Payer: PRIVATE HEALTH INSURANCE | Admitting: Internal Medicine

## 2015-01-15 ENCOUNTER — Encounter: Payer: Self-pay | Admitting: Internal Medicine

## 2015-01-15 VITALS — BP 118/68 | HR 63 | Temp 98.2°F | Ht 65.0 in | Wt 174.1 lb

## 2015-01-15 DIAGNOSIS — E7800 Pure hypercholesterolemia, unspecified: Secondary | ICD-10-CM

## 2015-01-15 DIAGNOSIS — D649 Anemia, unspecified: Secondary | ICD-10-CM

## 2015-01-15 DIAGNOSIS — C50919 Malignant neoplasm of unspecified site of unspecified female breast: Secondary | ICD-10-CM

## 2015-01-15 DIAGNOSIS — I10 Essential (primary) hypertension: Secondary | ICD-10-CM

## 2015-01-15 DIAGNOSIS — E119 Type 2 diabetes mellitus without complications: Secondary | ICD-10-CM

## 2015-01-15 MED ORDER — TRAZODONE HCL 50 MG PO TABS: 25.0000 mg | ORAL_TABLET | Freq: Every evening | ORAL | Status: DC | PRN

## 2015-01-15 NOTE — Progress Notes (Signed)
Pre visit review using our clinic review tool, if applicable. No additional management support is needed unless otherwise documented below in the visit note. 

## 2015-01-17 ENCOUNTER — Encounter: Payer: Self-pay | Admitting: Internal Medicine

## 2015-01-17 ENCOUNTER — Telehealth: Payer: Self-pay | Admitting: Internal Medicine

## 2015-01-17 DIAGNOSIS — C50919 Malignant neoplasm of unspecified site of unspecified female breast: Secondary | ICD-10-CM

## 2015-01-17 NOTE — Assessment & Plan Note (Signed)
Blood pressure doing well.  Same medication regimen.  Follow pressures.  Follow metabolic panel.   

## 2015-01-17 NOTE — Assessment & Plan Note (Signed)
A1c just checked 12/15/14 - 7.1.  She has adjusted her diet.  Checking and recording her sugars.  Blood sugars averaging 120-130s in the am and pm sugars 130-160.  Overall doing better.  Will hold on adjusting her medication.  Follow.

## 2015-01-17 NOTE — Progress Notes (Signed)
Patient ID: Angel Floyd, female   DOB: [birth year], 64 y.o.   MRN: [id number]   Subjective:    Patient ID: Angel Floyd, female    DOB: 1951/04/29, 64 y.o.   MRN: [id number]  HPI  Patient here for a scheduled follow up.  Here to f/u on her sugars.  AM sugars averaging 120-130.  Pm sugars 130-160.  She is following her sugars and recording.  Watching her diet.  Overall she feels she is doing well.  Breathing stable.  No cardiac symptoms with increased activity or exertion.    Past Medical History  Diagnosis Date  . Hypercholesterolemia   . Hypertension   . Diabetes mellitus   . Breast cancer   . Anemia   . TIA (transient ischemic attack)   . History of abnormal Pap smear     class III, required cryosurgery     Current Outpatient Prescriptions on File Prior to Visit  Medication Sig Dispense Refill  . aspirin 81 MG tablet Take 81 mg by mouth daily.    . Calcium-Vitamin D (CALTRATE 600 PLUS-VIT D PO) Take 2 tablets by mouth daily.     Marland Kitchen esomeprazole (NEXIUM) 40 MG capsule Take 1 capsule (40 mg total) by mouth daily before breakfast. 30 capsule 5  . FeFum-FePoly-FA-B Cmp-C-Biot (INTEGRA PLUS) CAPS Take 1 po daily 30 capsule 5  . fish oil-omega-3 fatty acids 1000 MG capsule Take 2 g by mouth daily.     Marland Kitchen glucose blood test strip Use as instructed to check blood sugars three times daily. Has One Touch Ultra glucometer. Dx 250.00 100 each 5  . metFORMIN (GLUCOPHAGE) 500 MG tablet Take 1 tablet (500 mg total) by mouth daily. 30 tablet 5  . rosuvastatin (CRESTOR) 10 MG tablet Take 1 tablet (10 mg total) by mouth daily. 30 tablet 4  . telmisartan (MICARDIS) 20 MG tablet Take 1 tablet (20 mg total) by mouth daily. 30 tablet 5   No current facility-administered medications on file prior to visit.    Review of Systems  Constitutional: Negative for appetite change and unexpected weight change.  Respiratory: Negative for cough, chest tightness and shortness of breath.   Cardiovascular:  Negative for chest pain, palpitations and leg swelling.  Gastrointestinal: Negative for nausea, vomiting, abdominal pain and diarrhea.  Neurological: Negative for dizziness, light-headedness and headaches.       Objective:     Blood pressure recheck:  124/72  Physical Exam  Constitutional: She appears well-developed and well-nourished. No distress.  HENT:  Nose: Nose normal.  Mouth/Throat: Oropharynx is clear and moist.  Neck: Neck supple. No thyromegaly present.  Cardiovascular: Normal rate and regular rhythm.   Pulmonary/Chest: Breath sounds normal. No respiratory distress. She has no wheezes.  Abdominal: Soft. Bowel sounds are normal. There is no tenderness.  Musculoskeletal: She exhibits no edema or tenderness.  Lymphadenopathy:    She has no cervical adenopathy.    BP 118/68 mmHg  Pulse 63  Temp(Src) 98.2 F (36.8 C) (Oral)  Ht 5\' 5"  (1.651 m)  Wt 174 lb 2 oz (78.983 kg)  BMI 28.98 kg/m2  SpO2 100% Wt Readings from Last 3 Encounters:  01/15/15 174 lb 2 oz (78.983 kg)  12/08/14 174 lb 12 oz (79.266 kg)  10/26/14 174 lb (78.926 kg)     Lab Results  Component Value Date   WBC 5.1 07/02/2014   HGB 13.0 07/02/2014   HCT 38.5 07/02/2014   PLT 196.0 07/02/2014   GLUCOSE 144* 12/15/2014  CHOL 150 12/15/2014   TRIG 104.0 12/15/2014   HDL 53.10 12/15/2014   LDLCALC 76 12/15/2014   ALT 21 12/15/2014   AST 20 12/15/2014   NA 143 12/15/2014   K 3.6 12/15/2014   CL 107 12/15/2014   CREATININE 0.71 12/15/2014   BUN 15 12/15/2014   CO2 27 12/15/2014   TSH 1.74 07/02/2014   HGBA1C 7.1* 12/15/2014   MICROALBUR 0.3 07/02/2014       Assessment & Plan:   Problem List Items Addressed This Visit    Anemia    On integra.  Did not tolerate ferrous sulfate.  Tolerating the integra.  Follow hgb.        Breast cancer    Continues f/u at [facility].        Diabetes mellitus    A1c just checked 12/15/14 - 7.1.  She has adjusted her diet.  Checking and recording her  sugars.  Blood sugars averaging 120-130s in the am and pm sugars 130-160.  Overall doing better.  Will hold on adjusting her medication.  Follow.        Hypercholesterolemia    Low cholesterol diet and exercise.  On crestor.  Follow lipid panel and liver function tests.       Hypertension - Primary    Blood pressure doing well.  Same medication regimen.  Follow pressures.  Follow metabolic panel.            Einar Pheasant, MD

## 2015-01-17 NOTE — Assessment & Plan Note (Signed)
Continues f/u at [facility].

## 2015-01-17 NOTE — Assessment & Plan Note (Signed)
Low cholesterol diet and exercise.  On crestor.  Follow lipid panel and liver function tests.  

## 2015-01-17 NOTE — Assessment & Plan Note (Signed)
On integra.  Did not tolerate ferrous sulfate.  Tolerating the integra.  Follow hgb.

## 2015-01-17 NOTE — Telephone Encounter (Signed)
Pt taking integra (iron daily).  Had problems (intolerance) with ferrous sulfate.  Unable to take ferrous sulfate.  States needs prior authorization for [employer].  States to call [phone] for authorization.  Thanks.

## 2015-01-18 ENCOUNTER — Encounter: Payer: Self-pay | Admitting: Internal Medicine

## 2015-01-18 NOTE — Telephone Encounter (Signed)
PA completed on cover my meds. Awaiting a decision at this time.

## 2015-01-18 NOTE — Telephone Encounter (Signed)
I contacted patients insurance company & was notified that Rogue Bussing is not a covered medication & it does not qualify for an exemption because it is considered an non-essential medication. Pt notified via mychart

## 2015-01-18 NOTE — Telephone Encounter (Signed)
Thanks.  Let me know if she needs something else called in.

## 2015-01-20 ENCOUNTER — Encounter: Payer: Self-pay | Admitting: Internal Medicine

## 2015-01-20 ENCOUNTER — Telehealth: Payer: Self-pay | Admitting: Internal Medicine

## 2015-01-20 NOTE — Telephone Encounter (Signed)
Pt notified of lab results via my chart.  Needs a non fasting lab appt within the next week.  Please schedule and contact her with a lab appt date and time.  Thanks.    Dr Nicki Reaper

## 2015-01-20 NOTE — Addendum Note (Signed)
Addended by: Alisa Graff on: 01/20/2015 05:08 AM   Modules accepted: Orders

## 2015-01-20 NOTE — Telephone Encounter (Signed)
Pt sent mychart, requesting 01/27/15 lab appt. Appt scheduled and pt notified via mychart.

## 2015-01-20 NOTE — Telephone Encounter (Signed)
Order placed for labs.

## 2015-01-26 ENCOUNTER — Other Ambulatory Visit: Payer: Self-pay | Admitting: *Deleted

## 2015-01-26 MED ORDER — METFORMIN HCL 500 MG PO TABS: 500.0000 mg | ORAL_TABLET | Freq: Every day | ORAL | Status: DC

## 2015-01-27 ENCOUNTER — Other Ambulatory Visit (INDEPENDENT_AMBULATORY_CARE_PROVIDER_SITE_OTHER): Payer: PRIVATE HEALTH INSURANCE

## 2015-01-27 DIAGNOSIS — C50919 Malignant neoplasm of unspecified site of unspecified female breast: Secondary | ICD-10-CM

## 2015-01-27 LAB — IBC PANEL
IRON: 87 ug/dL (ref 42–145)
Saturation Ratios: 36.8 % (ref 20.0–50.0)
Transferrin: 169 mg/dL — ABNORMAL LOW (ref 212.0–360.0)

## 2015-01-27 LAB — CBC WITH DIFFERENTIAL/PLATELET
Basophils Absolute: 0 10*3/uL (ref 0.0–0.1)
Basophils Relative: 0.5 % (ref 0.0–3.0)
Eosinophils Absolute: 0.2 10*3/uL (ref 0.0–0.7)
Eosinophils Relative: 3.3 % (ref 0.0–5.0)
HCT: 40 % (ref 36.0–46.0)
HEMOGLOBIN: 13.6 g/dL (ref 12.0–15.0)
LYMPHS PCT: 29.1 % (ref 12.0–46.0)
Lymphs Abs: 1.6 10*3/uL (ref 0.7–4.0)
MCHC: 34.1 g/dL (ref 30.0–36.0)
MCV: 87 fl (ref 78.0–100.0)
Monocytes Absolute: 0.4 10*3/uL (ref 0.1–1.0)
Monocytes Relative: 6.8 % (ref 3.0–12.0)
NEUTROS ABS: 3.3 10*3/uL (ref 1.4–7.7)
NEUTROS PCT: 60.3 % (ref 43.0–77.0)
PLATELETS: 193 10*3/uL (ref 150.0–400.0)
RBC: 4.59 Mil/uL (ref 3.87–5.11)
RDW: 13.7 % (ref 11.5–15.5)
WBC: 5.4 10*3/uL (ref 4.0–10.5)

## 2015-01-27 LAB — FERRITIN: Ferritin: 54.3 ng/mL (ref 10.0–291.0)

## 2015-01-29 ENCOUNTER — Encounter: Payer: Self-pay | Admitting: Internal Medicine

## 2015-03-12 LAB — HM DIABETES EYE EXAM

## 2015-03-13 NOTE — Op Note (Signed)
PATIENT NAME:  Angel Floyd, Angel Floyd MR#:  [id number] DATE OF BIRTH:  1951-06-30  DATE OF PROCEDURE:  10/09/2014  PREOPERATIVE DIAGNOSIS: Torn medial meniscus, right knee with probable medial compartment chondral lesions.   POSTOPERATIVE DIAGNOSIS: Torn medial meniscus, right knee with probable medial compartment chondral lesions.   OPERATION: Arthroscopic partial medial meniscectomy plus debridement and coblation of the medial femoral chondral lesion and small lateral femoral chondral lesion.   SURGEON: Kathrene Alu., MD,    ANESTHESIA: General.   HISTORY: The patient had a long history of pain referable to the medial aspect of her left knee. MRI was consistent with a torn medial meniscus and some chondral changes. The patient was brought in for surgery due to her persistent discomfort. She had had a remote medial MAKOplasty on her right knee.   PROCEDURE: The patient was taken to the Operating Room where satisfactory general anesthesia was achieved. A tourniquet and leg holder were applied to the left thigh and the right lower extremity was supported with a well-leg holder. The left knee was prepped and draped in the usual fashion for an arthroscopic procedure. An inflow cannula was introduced superomedially. A small amount of clear synovial fluid was obtained. The joint was distended with lactated Ringer's. The scope was introduced through an inferolateral puncture wound and a probe through an inferomedial puncture wound. Inspection of the medial compartment revealed the patient had a degenerative type tear of the posterior horn of the medial meniscus which involved the root of the posterior horn. The patient also had grade 2 to 3 chondral changes in the mid weight-bearing portion of the medial femoral condyle.   I went ahead and resected the tear using a combination of basket biters and a motorized resector. The remaining rim was contoured with an angled ArthroCare wand. I then debrided the  femoral chondral lesion with a turbo whisker and Coblated the lesion with an Designer, fashion/clothing wand. Inspection of the intercondylar notch revealed the anterior cruciate was intact. Inspection of the lateral compartment along with probing of the lateral meniscus failed to reveal any meniscal pathology. The patient did have a very small grade 3 chondral lesion in the mid weight-bearing portion of the medial femoral condyle, which I Coblated with the ArthroCare paragon wand.   Inspection of the trochlear groove revealed it was smooth. Retropatellar surface appeared to be fairly smooth other than some mild fibrillation. The instruments were removed from the joint at this time.  The puncture wounds were closed with 3-0 nylon in a vertical mattress fashion. Several mL of 0.5% Marcaine without epinephrine were injected about each puncture wound. Betadine was applied to the wounds followed by a sterile dressing and an ice pack.   The patient was then awakened and transferred to a stretcher bed and taken to the recovery room in satisfactory condition.   Tourniquet was not inflated during the course of the procedure.  Blood loss was negligible.    ____________________________ Kathrene Alu., MD hbk:by D: 10/09/2014 08:53:38 ET T: 10/09/2014 14:36:31 ET JOB#: [id number]  cc: Kathrene Alu., MD, <Dictator> Vilinda Flake, Brooke Bonito MD ELECTRONICALLY SIGNED 11/08/2014 14:44

## 2015-03-15 ENCOUNTER — Encounter: Payer: Self-pay | Admitting: *Deleted

## 2015-04-08 ENCOUNTER — Ambulatory Visit (INDEPENDENT_AMBULATORY_CARE_PROVIDER_SITE_OTHER): Payer: PRIVATE HEALTH INSURANCE | Admitting: Internal Medicine

## 2015-04-08 ENCOUNTER — Encounter: Payer: Self-pay | Admitting: Internal Medicine

## 2015-04-08 VITALS — BP 120/70 | HR 62 | Temp 98.2°F | Ht 65.0 in | Wt 171.2 lb

## 2015-04-08 DIAGNOSIS — E7800 Pure hypercholesterolemia, unspecified: Secondary | ICD-10-CM

## 2015-04-08 DIAGNOSIS — E119 Type 2 diabetes mellitus without complications: Secondary | ICD-10-CM | POA: Diagnosis not present

## 2015-04-08 DIAGNOSIS — R0989 Other specified symptoms and signs involving the circulatory and respiratory systems: Secondary | ICD-10-CM

## 2015-04-08 DIAGNOSIS — I10 Essential (primary) hypertension: Secondary | ICD-10-CM | POA: Diagnosis not present

## 2015-04-08 DIAGNOSIS — D649 Anemia, unspecified: Secondary | ICD-10-CM

## 2015-04-08 DIAGNOSIS — C50919 Malignant neoplasm of unspecified site of unspecified female breast: Secondary | ICD-10-CM

## 2015-04-08 DIAGNOSIS — G459 Transient cerebral ischemic attack, unspecified: Secondary | ICD-10-CM

## 2015-04-08 DIAGNOSIS — Z0000 Encounter for general adult medical examination without abnormal findings: Secondary | ICD-10-CM

## 2015-04-08 LAB — LIPID PANEL
Cholesterol: 147 mg/dL (ref 0–200)
HDL: 52.7 mg/dL (ref >39.00)
LDL CALC: 85 mg/dL (ref 0–99)
NonHDL: 94.3
TRIGLYCERIDES: 47 mg/dL (ref 0.0–149.0)
Total CHOL/HDL Ratio: 3
VLDL: 9.4 mg/dL (ref 0.0–40.0)

## 2015-04-08 LAB — BASIC METABOLIC PANEL
BUN: 16 mg/dL (ref 6–23)
CHLORIDE: 106 meq/L (ref 96–112)
CO2: 30 mEq/L (ref 19–32)
CREATININE: 0.72 mg/dL (ref 0.40–1.20)
Calcium: 9.2 mg/dL (ref 8.4–10.5)
GFR: 86.92 mL/min (ref >60.00)
Glucose, Bld: 137 mg/dL — ABNORMAL HIGH (ref 70–99)
Potassium: 4.1 mEq/L (ref 3.5–5.1)
SODIUM: 141 meq/L (ref 135–145)

## 2015-04-08 LAB — CBC WITH DIFFERENTIAL/PLATELET
BASOS PCT: 0.6 % (ref 0.0–3.0)
Basophils Absolute: 0 10*3/uL (ref 0.0–0.1)
EOS PCT: 4 % (ref 0.0–5.0)
Eosinophils Absolute: 0.2 10*3/uL (ref 0.0–0.7)
HEMATOCRIT: 40.1 % (ref 36.0–46.0)
Hemoglobin: 13.5 g/dL (ref 12.0–15.0)
LYMPHS ABS: 1.4 10*3/uL (ref 0.7–4.0)
Lymphocytes Relative: 31 % (ref 12.0–46.0)
MCHC: 33.7 g/dL (ref 30.0–36.0)
MCV: 87.8 fl (ref 78.0–100.0)
Monocytes Absolute: 0.3 10*3/uL (ref 0.1–1.0)
Monocytes Relative: 6.8 % (ref 3.0–12.0)
Neutro Abs: 2.6 10*3/uL (ref 1.4–7.7)
Neutrophils Relative %: 57.6 % (ref 43.0–77.0)
Platelets: 203 10*3/uL (ref 150.0–400.0)
RBC: 4.57 Mil/uL (ref 3.87–5.11)
RDW: 12.8 % (ref 11.5–15.5)
WBC: 4.6 10*3/uL (ref 4.0–10.5)

## 2015-04-08 LAB — HEPATIC FUNCTION PANEL
ALT: 23 U/L (ref 0–35)
AST: 21 U/L (ref 0–37)
Albumin: 4.1 g/dL (ref 3.5–5.2)
Alkaline Phosphatase: 71 U/L (ref 39–117)
BILIRUBIN DIRECT: 0.1 mg/dL (ref 0.0–0.3)
BILIRUBIN TOTAL: 0.4 mg/dL (ref 0.2–1.2)
TOTAL PROTEIN: 6.5 g/dL (ref 6.0–8.3)

## 2015-04-08 LAB — FERRITIN: FERRITIN: 19.2 ng/mL (ref 10.0–291.0)

## 2015-04-08 LAB — HEMOGLOBIN A1C: Hgb A1c MFr Bld: 6.4 % (ref 4.6–6.5)

## 2015-04-08 NOTE — Assessment & Plan Note (Signed)
Off iron.  Recheck cbc and ferritin.

## 2015-04-08 NOTE — Progress Notes (Signed)
Subjective:  Patient ID: Angel Floyd, female    DOB: June 22, 1951  Age: 64 y.o. MRN: [id number]  CC:  Here for scheduled follow up.   HPI Angel Floyd presents for scheduled follow up.  Doing well.  Has adjusted her diet.  Sugars appear to be better.  AM sugars averaging 130s and PM sugars averaging 140-150.  See attached list.  Is exercising.  No cardiac symptoms with increased activity or exertion.  Breathing stable.  No abdominal pain.  Bowels doing well.    Past Medical History  Diagnosis Date  . Hypercholesterolemia   . Hypertension   . Diabetes mellitus   . Breast cancer   . Anemia   . TIA (transient ischemic attack)   . History of abnormal Pap smear     class III, required cryosurgery     Outpatient Prescriptions Prior to Visit  Medication Sig Dispense Refill  . aspirin 81 MG tablet Take 81 mg by mouth daily.    . Calcium-Vitamin D (CALTRATE 600 PLUS-VIT D PO) Take 2 tablets by mouth daily.     Marland Kitchen esomeprazole (NEXIUM) 40 MG capsule Take 1 capsule (40 mg total) by mouth daily before breakfast. 30 capsule 5  . fish oil-omega-3 fatty acids 1000 MG capsule Take 2 g by mouth daily.     Marland Kitchen glucose blood test strip Use as instructed to check blood sugars three times daily. Has One Touch Ultra glucometer. Dx 250.00 100 each 5  . metFORMIN (GLUCOPHAGE) 500 MG tablet Take 1 tablet (500 mg total) by mouth daily. 30 tablet 5  . rosuvastatin (CRESTOR) 10 MG tablet Take 1 tablet (10 mg total) by mouth daily. 30 tablet 4  . telmisartan (MICARDIS) 20 MG tablet Take 1 tablet (20 mg total) by mouth daily. 30 tablet 5  . FeFum-FePoly-FA-B Cmp-C-Biot (INTEGRA PLUS) CAPS Take 1 po daily 30 capsule 5  . traZODone (DESYREL) 50 MG tablet Take 0.5-1 tablets (25-50 mg total) by mouth at bedtime as needed for sleep. (Patient not taking: Reported on 04/08/2015) 30 tablet 1   No facility-administered medications prior to visit.    ROS Review of Systems  Constitutional: Negative for appetite  change and unexpected weight change.  HENT: Positive for congestion. Negative for sinus pressure.   Respiratory: Negative for cough, chest tightness and shortness of breath.   Cardiovascular: Negative for chest pain, palpitations and leg swelling.  Gastrointestinal: Negative for nausea, vomiting, abdominal pain and diarrhea.  Neurological: Negative for dizziness, light-headedness and headaches.  Psychiatric/Behavioral: Negative for dysphoric mood and agitation.    Objective:  BP 120/70 mmHg  Pulse 62  Temp(Src) 98.2 F (36.8 C) (Oral)  Ht 5\' 5"  (1.651 m)  Wt 171 lb 4 oz (77.678 kg)  BMI 28.50 kg/m2  SpO2 96%  BP Readings from Last 3 Encounters:  04/08/15 120/70  01/15/15 118/68  12/08/14 124/70    Wt Readings from Last 3 Encounters:  04/08/15 171 lb 4 oz (77.678 kg)  01/15/15 174 lb 2 oz (78.983 kg)  12/08/14 174 lb 12 oz (79.266 kg)    Blood pressure recheck:  124/72  Physical Exam  Constitutional: She appears well-developed and well-nourished. No distress.  HENT:  Nose: Nose normal.  Mouth/Throat: Oropharynx is clear and moist.  Neck: Neck supple. No thyromegaly present.  Cardiovascular: Normal rate and regular rhythm.   Pulmonary/Chest: Breath sounds normal. No respiratory distress. She has no wheezes.  Abdominal: Soft. Bowel sounds are normal. There is no tenderness.  Musculoskeletal:  She exhibits no edema or tenderness.  Lymphadenopathy:    She has no cervical adenopathy.  Skin: No rash noted. No erythema.  Psychiatric: She has a normal mood and affect.    Lab Results  Component Value Date   HGBA1C 7.1* 12/15/2014   HGBA1C 6.6* 07/02/2014   HGBA1C 6.6* 02/26/2014    Lab Results  Component Value Date   CREATININE 0.71 12/15/2014   CREATININE 0.7 07/02/2014   CREATININE 0.7 02/26/2014    Lab Results  Component Value Date   WBC 5.4 01/27/2015   HGB 13.6 01/27/2015   HCT 40.0 01/27/2015   PLT 193.0 01/27/2015   GLUCOSE 144* 12/15/2014   CHOL 150  12/15/2014   TRIG 104.0 12/15/2014   HDL 53.10 12/15/2014   LDLCALC 76 12/15/2014   ALT 21 12/15/2014   AST 20 12/15/2014   NA 143 12/15/2014   K 3.6 12/15/2014   CL 107 12/15/2014   CREATININE 0.71 12/15/2014   BUN 15 12/15/2014   CO2 27 12/15/2014   TSH 1.74 07/02/2014   HGBA1C 7.1* 12/15/2014   MICROALBUR 0.3 07/02/2014     Assessment & Plan:   Problem List Items Addressed This Visit    Anemia    Off iron.  Recheck cbc and ferritin.        Relevant Orders   CBC with Differential/Platelet   Ferritin   Breast cancer    Continues f/u at [facility].  Mammogram 09/25/14 - ok.       Carotid bruit    Carotid ultrasound 12/18/14 - results as outlined.  Recommended f/u carotid US in two years.       Diabetes mellitus    Sugars as outlined.  Doing better.  Continue diet and exercise.  Check metabolic panel and W6O.        Relevant Orders   Hemoglobin A1c   Health care maintenance    Physical 07/06/14.  Colonoscopy 07/30/14 normal.  Mammogram 09/25/14 - ok.  Followed at [facility].  States had bone density at [facility] - normal.       Hypercholesterolemia    On crestor.  Low cholesterol diet and exercise.  Follow lipid panel and liver function tests.       Relevant Orders   Lipid panel   Hepatic function panel   Hypertension - Primary    Blood pressure doing well.  Continue same medication regimen.  Follow pressures.  Follow metabolic panel.       Relevant Orders   Basic metabolic panel   TIA (transient ischemic attack)    On aspirin.  No reoccurring symptoms.          I have discontinued Ms. Sforza's INTEGRA PLUS. I am also having her maintain her aspirin, fish oil-omega-3 fatty acids, Calcium-Vitamin D (CALTRATE 600 PLUS-VIT D PO), telmisartan, esomeprazole, rosuvastatin, glucose blood, traZODone, and metFORMIN.   Medications Discontinued During This Encounter  Medication Reason  . FeFum-FePoly-FA-B Cmp-C-Biot (INTEGRA PLUS) CAPS Patient has not taken in last 30 days     Follow-up: 4 month - phydivsl   Einar Pheasant, MD

## 2015-04-08 NOTE — Assessment & Plan Note (Signed)
On crestor.  Low cholesterol diet and exercise.  Follow lipid panel and liver function tests.   

## 2015-04-08 NOTE — Assessment & Plan Note (Signed)
Carotid ultrasound 12/18/14 - results as outlined.  Recommended f/u carotid US in two years.

## 2015-04-08 NOTE — Progress Notes (Signed)
Pre visit review using our clinic review tool, if applicable. No additional management support is needed unless otherwise documented below in the visit note. 

## 2015-04-08 NOTE — Assessment & Plan Note (Signed)
Continues f/u at [facility].  Mammogram 09/25/14 - ok.

## 2015-04-08 NOTE — Assessment & Plan Note (Signed)
On aspirin.  No reoccurring symptoms.

## 2015-04-08 NOTE — Assessment & Plan Note (Signed)
Blood pressure doing well.  Continue same medication regimen.  Follow pressures.  Follow metabolic panel.  

## 2015-04-08 NOTE — Assessment & Plan Note (Signed)
Sugars as outlined.  Doing better.  Continue diet and exercise.  Check metabolic panel and Y6E.

## 2015-04-08 NOTE — Assessment & Plan Note (Signed)
Physical 07/06/14.  Colonoscopy 07/30/14 normal.  Mammogram 09/25/14 - ok.  Followed at [facility].  States had bone density at [facility] - normal.

## 2015-06-02 ENCOUNTER — Other Ambulatory Visit: Payer: Self-pay | Admitting: *Deleted

## 2015-06-04 MED ORDER — ROSUVASTATIN CALCIUM 10 MG PO TABS: 10.0000 mg | ORAL_TABLET | Freq: Every day | ORAL | Status: DC

## 2015-06-07 ENCOUNTER — Ambulatory Visit (INDEPENDENT_AMBULATORY_CARE_PROVIDER_SITE_OTHER): Payer: PRIVATE HEALTH INSURANCE | Admitting: Nurse Practitioner

## 2015-06-07 VITALS — BP 120/60 | HR 79 | Temp 97.9°F | Resp 16 | Ht 65.0 in | Wt 172.4 lb

## 2015-06-07 DIAGNOSIS — R21 Rash and other nonspecific skin eruption: Secondary | ICD-10-CM | POA: Diagnosis not present

## 2015-06-07 NOTE — Progress Notes (Signed)
Pre visit review using our clinic review tool, if applicable. No additional management support is needed unless otherwise documented below in the visit note. 

## 2015-06-07 NOTE — Progress Notes (Signed)
   Subjective:    Patient ID: Angel Floyd, female    DOB: 05/22/1951, 64 y.o.   MRN: [id number]  HPI   [name] is a 64 yo female with a CC of rash x 4 days.  1) Right forearm- gardening on Friday, reports wearing garden gloves. Did not see anything that looked like poison ivy or oak.  Vinegar and salt rinse  Ointment- Cortisone 10  Zyrtec- helpful   Review of Systems  Constitutional: Negative for fever, chills, diaphoresis and fatigue.  Gastrointestinal: Negative for nausea, vomiting and diarrhea.  Skin: Positive for rash. Negative for pallor and wound.      Objective:   Physical Exam  Constitutional: She is oriented to person, place, and time. She appears well-developed and well-nourished. No distress.  BP 120/60 mmHg  Pulse 79  Temp(Src) 97.9 F (36.6 C)  Resp 16  Ht 5\' 5"  (1.651 m)  Wt 172 lb 6.4 oz (78.2 kg)  BMI 28.69 kg/m2  SpO2 96%   HENT:  Head: Normocephalic and atraumatic.  Right Ear: External ear normal.  Left Ear: External ear normal.  Cardiovascular: Normal rate, regular rhythm, normal heart sounds and intact distal pulses.  Exam reveals no gallop and no friction rub.   No murmur heard. Pulmonary/Chest: Effort normal and breath sounds normal. No respiratory distress. She has no wheezes. She has no rales. She exhibits no tenderness.  Neurological: She is alert and oriented to person, place, and time. No cranial nerve deficit. She exhibits normal muscle tone. Coordination normal.  Skin: Skin is warm and dry. Rash noted. She is not diaphoretic.     Psychiatric: She has a normal mood and affect. Her behavior is normal. Judgment and thought content normal.      Assessment & Plan:

## 2015-06-07 NOTE — Patient Instructions (Signed)

## 2015-06-15 ENCOUNTER — Encounter: Payer: Self-pay | Admitting: Nurse Practitioner

## 2015-06-15 DIAGNOSIS — R21 Rash and other nonspecific skin eruption: Secondary | ICD-10-CM | POA: Insufficient documentation

## 2015-06-15 NOTE — Assessment & Plan Note (Signed)
Gave pt handout with information about poison ivy. Pt states cortisone is helpful. Will continue this and also gave her info about OTC products and conservative management.

## 2015-06-22 ENCOUNTER — Other Ambulatory Visit: Payer: Self-pay

## 2015-06-22 MED ORDER — ESOMEPRAZOLE MAGNESIUM 40 MG PO CPDR: 40.0000 mg | DELAYED_RELEASE_CAPSULE | Freq: Every day | ORAL | Status: DC

## 2015-08-04 ENCOUNTER — Other Ambulatory Visit: Payer: Self-pay

## 2015-08-04 MED ORDER — TELMISARTAN 20 MG PO TABS: 20.0000 mg | ORAL_TABLET | Freq: Every day | ORAL | Status: DC

## 2015-08-10 ENCOUNTER — Ambulatory Visit (INDEPENDENT_AMBULATORY_CARE_PROVIDER_SITE_OTHER): Payer: PRIVATE HEALTH INSURANCE | Admitting: Internal Medicine

## 2015-08-10 ENCOUNTER — Encounter: Payer: Self-pay | Admitting: Internal Medicine

## 2015-08-10 VITALS — BP 128/70 | HR 74 | Temp 98.3°F | Ht 65.1 in | Wt 168.2 lb

## 2015-08-10 DIAGNOSIS — E7800 Pure hypercholesterolemia, unspecified: Secondary | ICD-10-CM

## 2015-08-10 DIAGNOSIS — E119 Type 2 diabetes mellitus without complications: Secondary | ICD-10-CM

## 2015-08-10 DIAGNOSIS — I10 Essential (primary) hypertension: Secondary | ICD-10-CM

## 2015-08-10 DIAGNOSIS — Z0000 Encounter for general adult medical examination without abnormal findings: Secondary | ICD-10-CM

## 2015-08-10 DIAGNOSIS — H6691 Otitis media, unspecified, right ear: Secondary | ICD-10-CM

## 2015-08-10 DIAGNOSIS — R0989 Other specified symptoms and signs involving the circulatory and respiratory systems: Secondary | ICD-10-CM

## 2015-08-10 DIAGNOSIS — D649 Anemia, unspecified: Secondary | ICD-10-CM | POA: Diagnosis not present

## 2015-08-10 DIAGNOSIS — C50919 Malignant neoplasm of unspecified site of unspecified female breast: Secondary | ICD-10-CM

## 2015-08-10 LAB — HM DIABETES FOOT EXAM

## 2015-08-10 MED ORDER — AMOXICILLIN 875 MG PO TABS: 875.0000 mg | ORAL_TABLET | Freq: Two times a day (BID) | ORAL | Status: DC

## 2015-08-10 NOTE — Progress Notes (Signed)
Patient ID: Angel Floyd, female   DOB: 02/22/1951, 64 y.o.   MRN: [id number]   Subjective:    Patient ID: Angel Floyd, female    DOB: 04-21-1951, 64 y.o.   MRN: [id number]  HPI  Patient with past history of hypertension, diabetes, hypercholesterolemia and breast cancer.  She comes in today to follow up on these issues as well as for a physical exam.  She started on 07/30/15 with sore throat.  Increased nasal congestion and productive colored mucus.  Sore throat is better.  Still with increased congestion.  Right ear fullness and discomfort.  No sob.  Some increased cough - productive.  No chest pain or tightness.  No acid reflux.  No abdominal pain or cramping.  No bowel changes.  Blood sugars averaging 115-120 in the am.  Had been walking.  Mother-n-law recently passed away.  Plans to get back in routine.  Handling stress relatively well.     Past Medical History  Diagnosis Date  . Hypercholesterolemia   . Hypertension   . Diabetes mellitus   . Breast cancer   . Anemia   . TIA (transient ischemic attack)   . History of abnormal Pap smear     class III, required cryosurgery   Past Surgical History  Procedure Laterality Date  . Tubal ligation    . Dilation and curettage of uterus    . Gynecologic cryosurgery      class III pap  . Breast cyst aspiration  1989  . Hand surgery      cyst removed - left hand  . Foot surgery      morton's neuroma removed - right foot  . Hysteroscopy and d&c  7/09  . Foot surgery  10/2009    achilles tendon release with reconstructive surgery  . Knee surgery  09/20/12    torn meniscus, (Dr Leanor Kail)  . Knee arthroscopy Left [id number]    removed tear & resurfaced area   Family History  Problem Relation Age of Onset  . Lung cancer Father   . Alcohol abuse Father   . Cancer Father     lung  . CVA Mother   . Colon cancer Mother   . Alcohol abuse Mother   . Cancer Mother     colon  . Stroke Mother   . Colon cancer Sister   . Diabetes  Maternal Grandfather    Social History   Social History  . Marital Status: Married    Spouse Name: N/A  . Number of Children: 2  . Years of Education: N/A   Social History Main Topics  . Smoking status: Never Smoker   . Smokeless tobacco: Never Used  . Alcohol Use: No  . Drug Use: No  . Sexual Activity: Not Asked   Other Topics Concern  . None   Social History Narrative    Outpatient Encounter Prescriptions as of 08/10/2015  Medication Sig  . aspirin 81 MG tablet Take 81 mg by mouth daily.  . Calcium-Vitamin D (CALTRATE 600 PLUS-VIT D PO) Take 2 tablets by mouth daily.   Marland Kitchen esomeprazole (NEXIUM) 40 MG capsule Take 1 capsule (40 mg total) by mouth daily before breakfast.  . fish oil-omega-3 fatty acids 1000 MG capsule Take 2 g by mouth daily.   Marland Kitchen glucose blood test strip Use as instructed to check blood sugars three times daily. Has One Touch Ultra glucometer. Dx 250.00  . metFORMIN (GLUCOPHAGE) 500 MG tablet Take 1 tablet (500 mg  total) by mouth daily.  . rosuvastatin (CRESTOR) 10 MG tablet Take 1 tablet (10 mg total) by mouth daily.  Marland Kitchen telmisartan (MICARDIS) 20 MG tablet Take 1 tablet (20 mg total) by mouth daily.  . traZODone (DESYREL) 50 MG tablet Take 0.5-1 tablets (25-50 mg total) by mouth at bedtime as needed for sleep.  Marland Kitchen amoxicillin (AMOXIL) 875 MG tablet Take 1 tablet (875 mg total) by mouth 2 (two) times daily.   No facility-administered encounter medications on file as of 08/10/2015.    Review of Systems  Constitutional: Negative for appetite change and unexpected weight change.  HENT: Negative for congestion and sinus pressure.   Eyes: Negative for pain and visual disturbance.  Respiratory: Negative for cough, chest tightness and shortness of breath.   Cardiovascular: Negative for chest pain, palpitations and leg swelling.  Gastrointestinal: Negative for nausea, vomiting, abdominal pain and diarrhea.  Genitourinary: Negative for dysuria and difficulty urinating.   Musculoskeletal: Negative for back pain and joint swelling.  Skin: Negative for color change and rash.  Neurological: Negative for dizziness, light-headedness and headaches.  Hematological: Negative for adenopathy. Does not bruise/bleed easily.  Psychiatric/Behavioral: Negative for dysphoric mood and agitation.       Objective:     Blood pressure rechecked by me:  130/78  Physical Exam  Constitutional: She is oriented to person, place, and time. She appears well-developed and well-nourished. No distress.  HENT:  Mouth/Throat: Oropharynx is clear and moist.  Slightly erythematous turbinates.  Right TM - slight erythema and fluid posterior TM.   Eyes: Right eye exhibits no discharge. Left eye exhibits no discharge. No scleral icterus.  Neck: Neck supple. No thyromegaly present.  Cardiovascular: Normal rate and regular rhythm.   Pulmonary/Chest: Breath sounds normal. No accessory muscle usage. No tachypnea. No respiratory distress. She has no decreased breath sounds. She has no wheezes. She has no rhonchi. Right breast exhibits no inverted nipple, no mass, no nipple discharge and no tenderness (no axillary adenopathy). Left breast exhibits no inverted nipple, no mass, no nipple discharge and no tenderness (no axilarry adenopathy).  Abdominal: Soft. Bowel sounds are normal. There is no tenderness.  Musculoskeletal: She exhibits no edema or tenderness.  Lymphadenopathy:    She has no cervical adenopathy.  Neurological: She is alert and oriented to person, place, and time.  Skin: Skin is warm. No rash noted. No erythema.  Psychiatric: She has a normal mood and affect. Her behavior is normal.    BP 128/70 mmHg  Pulse 74  Temp(Src) 98.3 F (36.8 C) (Oral)  Ht 5' 5.1" (1.654 m)  Wt 168 lb 4 oz (76.318 kg)  BMI 27.90 kg/m2  SpO2 97% Wt Readings from Last 3 Encounters:  08/10/15 168 lb 4 oz (76.318 kg)  06/07/15 172 lb 6.4 oz (78.2 kg)  04/08/15 171 lb 4 oz (77.678 kg)     Lab  Results  Component Value Date   WBC 4.6 04/08/2015   HGB 13.5 04/08/2015   HCT 40.1 04/08/2015   PLT 203.0 04/08/2015   GLUCOSE 137* 04/08/2015   CHOL 147 04/08/2015   TRIG 47.0 04/08/2015   HDL 52.70 04/08/2015   LDLCALC 85 04/08/2015   ALT 23 04/08/2015   AST 21 04/08/2015   NA 141 04/08/2015   K 4.1 04/08/2015   CL 106 04/08/2015   CREATININE 0.72 04/08/2015   BUN 16 04/08/2015   CO2 30 04/08/2015   TSH 1.74 07/02/2014   HGBA1C 6.4 04/08/2015   MICROALBUR 0.3 07/02/2014  Assessment & Plan:   Problem List Items Addressed This Visit    Anemia    Follow cbc.       Breast cancer    Mammogram 09/25/14 - ok.  Continues to f/u at [facility].  Has f/u planned.        Carotid bruit    Carotid ultrasound 12/18/14 - 1-39% bilateral.  Recommended f/u carotid ultrasound in two years.        Diabetes mellitus    Sugars have been doing well.  Low carb diet and exercise.  Follow met b and a1c.   Lab Results  Component Value Date   HGBA1C 6.4 04/08/2015        Relevant Orders   Hemoglobin H4N   Basic metabolic panel   Health care maintenance    Physical today 08/10/15.  PAP 07/06/14 negative with negative HPV.  Mammogram 09/25/14 - birads II.  Scheduled for f/u mammogram 09/2015.  Colonoscopy 07/30/14.  Recommended f/u colonoscopy in five years.        Hypercholesterolemia    On crestor.  Low cholesterol diet and exercise.  Follow lipid panel and liver function tests.   Lab Results  Component Value Date   CHOL 147 04/08/2015   HDL 52.70 04/08/2015   LDLCALC 85 04/08/2015   TRIG 47.0 04/08/2015   CHOLHDL 3 04/08/2015        Hypertension - Primary    Blood pressure under good control.  Continue same medication regimen.  Follow pressures.  Follow metabolic panel.        Relevant Orders   Lipid panel   Hepatic function panel   TSH   Right otitis media    Symptoms and exam as outlined.  Amoxicillin as directed.  mucinex and robitussin as directed.  Afrin nasal spray  for three days.  Follow.        Relevant Medications   amoxicillin (AMOXIL) 875 MG tablet       Einar Pheasant, MD

## 2015-08-10 NOTE — Progress Notes (Signed)
Pre-visit discussion using our clinic review tool. No additional management support is needed unless otherwise documented below in the visit note.  

## 2015-08-10 NOTE — Patient Instructions (Signed)
Continue flonase.    Afrin nasal spray - 2 sprays each nostril 2x/day for three days only then stop.    Take a probiotic (align) one per day while on antibiotics and for two weeks after complete antibiotics.

## 2015-08-15 ENCOUNTER — Encounter: Payer: Self-pay | Admitting: Internal Medicine

## 2015-08-15 NOTE — Assessment & Plan Note (Signed)
Symptoms and exam as outlined.  Amoxicillin as directed.  mucinex and robitussin as directed.  Afrin nasal spray for three days.  Follow.

## 2015-08-15 NOTE — Assessment & Plan Note (Signed)
Blood pressure under good control.  Continue same medication regimen.  Follow pressures.  Follow metabolic panel.   

## 2015-08-15 NOTE — Assessment & Plan Note (Signed)
Carotid ultrasound 12/18/14 - 1-39% bilateral.  Recommended f/u carotid ultrasound in two years.

## 2015-08-15 NOTE — Assessment & Plan Note (Signed)
Mammogram 09/25/14 - ok.  Continues to f/u at [facility].  Has f/u planned.

## 2015-08-15 NOTE — Assessment & Plan Note (Signed)
On crestor.  Low cholesterol diet and exercise.  Follow lipid panel and liver function tests.   Lab Results  Component Value Date   CHOL 147 04/08/2015   HDL 52.70 04/08/2015   LDLCALC 85 04/08/2015   TRIG 47.0 04/08/2015   CHOLHDL 3 04/08/2015

## 2015-08-15 NOTE — Assessment & Plan Note (Signed)
Physical today 08/10/15.  PAP 07/06/14 negative with negative HPV.  Mammogram 09/25/14 - birads II.  Scheduled for f/u mammogram 09/2015.  Colonoscopy 07/30/14.  Recommended f/u colonoscopy in five years.

## 2015-08-15 NOTE — Assessment & Plan Note (Signed)
Follow cbc.  

## 2015-08-15 NOTE — Assessment & Plan Note (Signed)
Sugars have been doing well.  Low carb diet and exercise.  Follow met b and a1c.   Lab Results  Component Value Date   HGBA1C 6.4 04/08/2015

## 2015-08-20 ENCOUNTER — Encounter: Payer: Self-pay | Admitting: Internal Medicine

## 2015-08-27 ENCOUNTER — Encounter: Payer: Self-pay | Admitting: Internal Medicine

## 2015-08-27 DIAGNOSIS — J029 Acute pharyngitis, unspecified: Secondary | ICD-10-CM

## 2015-08-27 DIAGNOSIS — H938X9 Other specified disorders of ear, unspecified ear: Secondary | ICD-10-CM

## 2015-08-27 NOTE — Telephone Encounter (Signed)
Order placed for ENT referral.  Pt notified via my chart.   

## 2015-09-03 ENCOUNTER — Encounter: Payer: Self-pay | Admitting: Internal Medicine

## 2015-09-03 ENCOUNTER — Other Ambulatory Visit (INDEPENDENT_AMBULATORY_CARE_PROVIDER_SITE_OTHER): Payer: PRIVATE HEALTH INSURANCE

## 2015-09-03 DIAGNOSIS — I10 Essential (primary) hypertension: Secondary | ICD-10-CM

## 2015-09-03 DIAGNOSIS — E119 Type 2 diabetes mellitus without complications: Secondary | ICD-10-CM

## 2015-09-03 LAB — BASIC METABOLIC PANEL
BUN: 19 mg/dL (ref 6–23)
CHLORIDE: 105 meq/L (ref 96–112)
CO2: 29 mEq/L (ref 19–32)
CREATININE: 0.76 mg/dL (ref 0.40–1.20)
Calcium: 9.3 mg/dL (ref 8.4–10.5)
GFR: 81.56 mL/min (ref >60.00)
Glucose, Bld: 111 mg/dL — ABNORMAL HIGH (ref 70–99)
Potassium: 4.1 mEq/L (ref 3.5–5.1)
Sodium: 143 mEq/L (ref 135–145)

## 2015-09-03 LAB — LIPID PANEL
CHOL/HDL RATIO: 3
Cholesterol: 158 mg/dL (ref 0–200)
HDL: 56.5 mg/dL (ref >39.00)
LDL Cholesterol: 92 mg/dL (ref 0–99)
NONHDL: 101.56
Triglycerides: 49 mg/dL (ref 0.0–149.0)
VLDL: 9.8 mg/dL (ref 0.0–40.0)

## 2015-09-03 LAB — HEPATIC FUNCTION PANEL
ALBUMIN: 4.1 g/dL (ref 3.5–5.2)
ALK PHOS: 70 U/L (ref 39–117)
ALT: 25 U/L (ref 0–35)
AST: 20 U/L (ref 0–37)
BILIRUBIN DIRECT: 0.1 mg/dL (ref 0.0–0.3)
BILIRUBIN TOTAL: 0.7 mg/dL (ref 0.2–1.2)
Total Protein: 6.4 g/dL (ref 6.0–8.3)

## 2015-09-03 LAB — TSH: TSH: 1.06 u[IU]/mL (ref 0.35–4.50)

## 2015-09-03 LAB — HEMOGLOBIN A1C: HEMOGLOBIN A1C: 6.8 % — AB (ref 4.6–6.5)

## 2015-10-08 LAB — HM MAMMOGRAPHY

## 2015-10-11 ENCOUNTER — Encounter: Payer: Self-pay | Admitting: Internal Medicine

## 2015-10-12 ENCOUNTER — Encounter: Payer: Self-pay | Admitting: Internal Medicine

## 2015-10-12 ENCOUNTER — Ambulatory Visit (INDEPENDENT_AMBULATORY_CARE_PROVIDER_SITE_OTHER): Payer: PRIVATE HEALTH INSURANCE | Admitting: Internal Medicine

## 2015-10-12 VITALS — BP 135/69 | HR 62 | Temp 97.9°F | Resp 18 | Ht 65.1 in | Wt 167.2 lb

## 2015-10-12 DIAGNOSIS — I10 Essential (primary) hypertension: Secondary | ICD-10-CM | POA: Diagnosis not present

## 2015-10-12 DIAGNOSIS — E119 Type 2 diabetes mellitus without complications: Secondary | ICD-10-CM

## 2015-10-12 DIAGNOSIS — E7800 Pure hypercholesterolemia, unspecified: Secondary | ICD-10-CM

## 2015-10-12 DIAGNOSIS — R079 Chest pain, unspecified: Secondary | ICD-10-CM | POA: Diagnosis not present

## 2015-10-12 NOTE — Progress Notes (Signed)
Pre-visit discussion using our clinic review tool. No additional management support is needed unless otherwise documented below in the visit note.  

## 2015-10-12 NOTE — Telephone Encounter (Signed)
Please place her on the schedule at 12:00 - chest pain (needs EKG)  Thanks

## 2015-10-12 NOTE — Progress Notes (Signed)
Patient ID: Angel Floyd, female   DOB: [birth year], 64 y.o.   MRN: [id number]   Subjective:    Patient ID: Angel Floyd, female    DOB: Jan 18, 1951, 64 y.o.   MRN: [id number]  HPI  Patient with past history of hypercholesterolemia, diabetes, hypertension, TIA and breast cancer.  She comes in today as a work in with concerns regarding chest pain.  She is accompanied by her husband.  History obtained from both of them.  She reports that on 10/03/15 she developed chest pain.  Pain then progressed to her right arm. The episode lasted approximately 4-5 hours.  Blood pressure and heart rate were ok.  Her husband reported that the pulse would have an occasional irregular beat.  Since this episode, she has not had any further episodes like that. Has not felt as well.  Not exercising now.  Does not feel back to her baseline.  More fatigued.  No increased cough or congestion.  No abdominal pain or cramping.  No acid reflux.     Past Medical History  Diagnosis Date  . Hypercholesterolemia   . Hypertension   . Diabetes mellitus ([place])   . Breast cancer ([place])   . Anemia   . TIA (transient ischemic attack)   . History of abnormal Pap smear     class III, required cryosurgery   Past Surgical History  Procedure Laterality Date  . Tubal ligation    . Dilation and curettage of uterus    . Gynecologic cryosurgery      class III pap  . Breast cyst aspiration  1989  . Hand surgery      cyst removed - left hand  . Foot surgery      morton's neuroma removed - right foot  . Hysteroscopy and d&c  7/09  . Foot surgery  10/2009    achilles tendon release with reconstructive surgery  . Knee surgery  09/20/12    torn meniscus, (Dr Leanor Kail)  . Knee arthroscopy Left [id number]    removed tear & resurfaced area   Family History  Problem Relation Age of Onset  . Lung cancer Father   . Alcohol abuse Father   . Cancer Father     lung  . CVA Mother   . Colon cancer Mother   . Alcohol abuse Mother     . Cancer Mother     colon  . Stroke Mother   . Colon cancer Sister   . Diabetes Maternal Grandfather    Social History   Social History  . Marital Status: Married    Spouse Name: N/A  . Number of Children: 2  . Years of Education: N/A   Social History Main Topics  . Smoking status: Never Smoker   . Smokeless tobacco: Never Used  . Alcohol Use: No  . Drug Use: No  . Sexual Activity: Not Asked   Other Topics Concern  . None   Social History Narrative    Outpatient Encounter Prescriptions as of 10/12/2015  Medication Sig  . aspirin 81 MG tablet Take 81 mg by mouth daily.  Marland Kitchen esomeprazole (NEXIUM) 40 MG capsule Take 1 capsule (40 mg total) by mouth daily before breakfast.  . fish oil-omega-3 fatty acids 1000 MG capsule Take 2 g by mouth daily.   Marland Kitchen glucose blood test strip Use as instructed to check blood sugars three times daily. Has One Touch Ultra glucometer. Dx 250.00  . metFORMIN (GLUCOPHAGE) 500 MG tablet Take 1 tablet (  500 mg total) by mouth daily.  . rosuvastatin (CRESTOR) 10 MG tablet Take 1 tablet (10 mg total) by mouth daily.  Marland Kitchen telmisartan (MICARDIS) 20 MG tablet Take 1 tablet (20 mg total) by mouth daily.  . traZODone (DESYREL) 50 MG tablet Take 0.5-1 tablets (25-50 mg total) by mouth at bedtime as needed for sleep.  . [DISCONTINUED] Calcium-Vitamin D (CALTRATE 600 PLUS-VIT D PO) Take 2 tablets by mouth daily.   . [DISCONTINUED] amoxicillin (AMOXIL) 875 MG tablet Take 1 tablet (875 mg total) by mouth 2 (two) times daily.   No facility-administered encounter medications on file as of 10/12/2015.    Review of Systems  Constitutional: Positive for fatigue. Negative for fever.       Not exercising.   HENT: Negative for congestion and sinus pressure.   Respiratory: Negative for cough and shortness of breath.   Cardiovascular: Positive for chest pain. Negative for palpitations and leg swelling.  Gastrointestinal: Negative for nausea, vomiting, abdominal pain and  diarrhea.  Genitourinary: Negative for dysuria and difficulty urinating.  Musculoskeletal: Negative for back pain and joint swelling.  Skin: Negative for color change and rash.  Neurological: Negative for dizziness, light-headedness and headaches.  Psychiatric/Behavioral: Negative for dysphoric mood and agitation.       Objective:    Physical Exam  Constitutional: She appears well-developed and well-nourished. No distress.  HENT:  Nose: Nose normal.  Mouth/Throat: Oropharynx is clear and moist.  Eyes: Conjunctivae are normal. Right eye exhibits no discharge. Left eye exhibits no discharge.  Neck: Neck supple. No thyromegaly present.  Cardiovascular: Normal rate and regular rhythm.   Pulmonary/Chest: Breath sounds normal. No respiratory distress. She has no wheezes.  Abdominal: Soft. Bowel sounds are normal. There is no tenderness.  Musculoskeletal: She exhibits no edema or tenderness.  Lymphadenopathy:    She has no cervical adenopathy.  Skin: No rash noted. No erythema.  Psychiatric: She has a normal mood and affect. Her behavior is normal.    BP 135/69 mmHg  Pulse 62  Temp(Src) 97.9 F (36.6 C) (Oral)  Resp 18  Ht 5' 5.1" (1.654 m)  Wt 167 lb 4 oz (75.864 kg)  BMI 27.73 kg/m2  SpO2 98% Wt Readings from Last 3 Encounters:  10/12/15 167 lb 4 oz (75.864 kg)  08/10/15 168 lb 4 oz (76.318 kg)  06/07/15 172 lb 6.4 oz (78.2 kg)     Lab Results  Component Value Date   WBC 4.6 04/08/2015   HGB 13.5 04/08/2015   HCT 40.1 04/08/2015   PLT 203.0 04/08/2015   GLUCOSE 111* 09/03/2015   CHOL 158 09/03/2015   TRIG 49.0 09/03/2015   HDL 56.50 09/03/2015   LDLCALC 92 09/03/2015   ALT 25 09/03/2015   AST 20 09/03/2015   NA 143 09/03/2015   K 4.1 09/03/2015   CL 105 09/03/2015   CREATININE 0.76 09/03/2015   BUN 19 09/03/2015   CO2 29 09/03/2015   TSH 1.06 09/03/2015   HGBA1C 6.8* 09/03/2015   MICROALBUR 0.3 07/02/2014       Assessment & Plan:   Problem List Items  Addressed This Visit    Chest pain - Primary    Chest pain as outlined.  EKG revealed SR with no acute ischemic changes.  TWI in III.  No signficant change when compared to previous EKG.  Given the pain and risk factors and continue fatigue, etc, discussed with her regarding further cardiac w/up.  She is in agreement.  Referral made to cardiology.  Relevant Orders   EKG 12-Lead (Completed)   Ambulatory referral to Cardiology   Diabetes mellitus ([place])    Sugars have previously been doing well.  Low carb diet and exercise.  Follow met b and a1c.   Lab Results  Component Value Date   HGBA1C 6.8* 09/03/2015        Hypercholesterolemia    Low cholesterol diet and exercise.  On crestor.  Cholesterol has been doing well.   Lab Results  Component Value Date   CHOL 158 09/03/2015   HDL 56.50 09/03/2015   LDLCALC 92 09/03/2015   TRIG 49.0 09/03/2015   CHOLHDL 3 09/03/2015        Hypertension    Blood pressure has been well controlled.  Continue same medication regimen.  Follow pressures.  Follow metabolic panel.           Einar Pheasant, MD

## 2015-10-13 ENCOUNTER — Encounter: Payer: Self-pay | Admitting: Internal Medicine

## 2015-10-13 DIAGNOSIS — R079 Chest pain, unspecified: Secondary | ICD-10-CM | POA: Insufficient documentation

## 2015-10-13 NOTE — Assessment & Plan Note (Signed)
Chest pain as outlined.  EKG revealed SR with no acute ischemic changes.  TWI in III.  No signficant change when compared to previous EKG.  Given the pain and risk factors and continue fatigue, etc, discussed with her regarding further cardiac w/up.  She is in agreement.  Referral made to cardiology.

## 2015-10-13 NOTE — Assessment & Plan Note (Signed)
Blood pressure has been well controlled.  Continue same medication regimen.  Follow pressures.  Follow metabolic panel.

## 2015-10-13 NOTE — Assessment & Plan Note (Signed)
Sugars have previously been doing well.  Low carb diet and exercise.  Follow met b and a1c.   Lab Results  Component Value Date   HGBA1C 6.8* 09/03/2015

## 2015-10-13 NOTE — Assessment & Plan Note (Signed)
Low cholesterol diet and exercise.  On crestor.  Cholesterol has been doing well.   Lab Results  Component Value Date   CHOL 158 09/03/2015   HDL 56.50 09/03/2015   LDLCALC 92 09/03/2015   TRIG 49.0 09/03/2015   CHOLHDL 3 09/03/2015

## 2015-10-25 ENCOUNTER — Other Ambulatory Visit: Payer: Self-pay

## 2015-10-25 ENCOUNTER — Telehealth: Payer: Self-pay | Admitting: Internal Medicine

## 2015-10-25 MED ORDER — METFORMIN HCL 500 MG PO TABS: 500.0000 mg | ORAL_TABLET | Freq: Every day | ORAL | Status: DC

## 2015-10-25 NOTE — Telephone Encounter (Signed)
Pt is going out of town tomorrow. She had [employer]. Fax over paper work to fill. Did we receive the paper work. Pt needs this filled.  Thanks, kp

## 2015-10-26 ENCOUNTER — Encounter: Payer: Self-pay | Admitting: *Deleted

## 2015-10-26 NOTE — Telephone Encounter (Signed)
Sent pt a Therapist, music. Don't really understand this message at all.

## 2015-10-27 NOTE — Telephone Encounter (Signed)
Pt responded via mychart & states that she has no clue what I was talking about wither. She had not called the office about any form.

## 2015-12-10 ENCOUNTER — Ambulatory Visit (INDEPENDENT_AMBULATORY_CARE_PROVIDER_SITE_OTHER): Payer: Managed Care, Other (non HMO) | Admitting: Internal Medicine

## 2015-12-10 ENCOUNTER — Encounter: Payer: Self-pay | Admitting: Internal Medicine

## 2015-12-10 VITALS — BP 120/62 | HR 62 | Temp 97.7°F | Resp 18 | Ht 65.1 in | Wt 165.2 lb

## 2015-12-10 DIAGNOSIS — I10 Essential (primary) hypertension: Secondary | ICD-10-CM

## 2015-12-10 DIAGNOSIS — C50919 Malignant neoplasm of unspecified site of unspecified female breast: Secondary | ICD-10-CM

## 2015-12-10 DIAGNOSIS — R0989 Other specified symptoms and signs involving the circulatory and respiratory systems: Secondary | ICD-10-CM

## 2015-12-10 DIAGNOSIS — E119 Type 2 diabetes mellitus without complications: Secondary | ICD-10-CM

## 2015-12-10 DIAGNOSIS — Z0000 Encounter for general adult medical examination without abnormal findings: Secondary | ICD-10-CM

## 2015-12-10 DIAGNOSIS — E7800 Pure hypercholesterolemia, unspecified: Secondary | ICD-10-CM

## 2015-12-10 NOTE — Progress Notes (Signed)
Patient ID: Angel Floyd, female   DOB: Mar 25, 1951, 65 y.o.   MRN: [id number]   Subjective:    Patient ID: Angel Floyd, female    DOB: July 30, 1951, 65 y.o.   MRN: [id number]  HPI  Patient with past history of hypercholesterolemia, diabetes, hypertension and breast cancer.  She comes in today to follow up on these issues.  She was recently evaluated for chest pain.  Saw cardiology.  Refer to cardiology's note for details.  Holter revealed rare SVTs.  Stress test revealed no ischemia.  No further pain.  Not been as active due to some health issues with her husband.  He is doing better.  Plans to start exercising more.  No chest pain or tightness with increased activity.  No sob.  No acid reflux.  No abdominal pain or cramping.  Bowels stable.     Past Medical History  Diagnosis Date  . Hypercholesterolemia   . Hypertension   . Diabetes mellitus ([place])   . Breast cancer ([place])   . Anemia   . TIA (transient ischemic attack)   . History of abnormal Pap smear     class III, required cryosurgery   Past Surgical History  Procedure Laterality Date  . Tubal ligation    . Dilation and curettage of uterus    . Gynecologic cryosurgery      class III pap  . Breast cyst aspiration  1989  . Hand surgery      cyst removed - left hand  . Foot surgery      morton's neuroma removed - right foot  . Hysteroscopy and d&c  7/09  . Foot surgery  10/2009    achilles tendon release with reconstructive surgery  . Knee surgery  09/20/12    torn meniscus, (Dr Leanor Kail)  . Knee arthroscopy Left [id number]    removed tear & resurfaced area   Family History  Problem Relation Age of Onset  . Lung cancer Father   . Alcohol abuse Father   . Cancer Father     lung  . CVA Mother   . Colon cancer Mother   . Alcohol abuse Mother   . Cancer Mother     colon  . Stroke Mother   . Colon cancer Sister   . Diabetes Maternal Grandfather    Social History   Social History  . Marital Status: Married   Spouse Name: N/A  . Number of Children: 2  . Years of Education: N/A   Social History Main Topics  . Smoking status: Never Smoker   . Smokeless tobacco: Never Used  . Alcohol Use: No  . Drug Use: No  . Sexual Activity: Not Asked   Other Topics Concern  . None   Social History Narrative    Outpatient Encounter Prescriptions as of 12/10/2015  Medication Sig  . aspirin 81 MG tablet Take 81 mg by mouth daily.  Marland Kitchen esomeprazole (NEXIUM) 40 MG capsule Take 1 capsule (40 mg total) by mouth daily before breakfast.  . fish oil-omega-3 fatty acids 1000 MG capsule Take 2 g by mouth daily.   Marland Kitchen glucose blood test strip Use as instructed to check blood sugars three times daily. Has One Touch Ultra glucometer. Dx 250.00  . metFORMIN (GLUCOPHAGE) 500 MG tablet Take 1 tablet (500 mg total) by mouth daily.  . rosuvastatin (CRESTOR) 10 MG tablet Take 1 tablet (10 mg total) by mouth daily.  Marland Kitchen telmisartan (MICARDIS) 20 MG tablet Take 1 tablet (20  mg total) by mouth daily.  . traZODone (DESYREL) 50 MG tablet Take 0.5-1 tablets (25-50 mg total) by mouth at bedtime as needed for sleep.   No facility-administered encounter medications on file as of 12/10/2015.    Review of Systems  Constitutional: Negative for appetite change and unexpected weight change.  HENT: Negative for congestion and sinus pressure.   Respiratory: Negative for cough, chest tightness and shortness of breath.   Cardiovascular: Negative for chest pain, palpitations and leg swelling.  Gastrointestinal: Negative for nausea, vomiting, abdominal pain and diarrhea.  Genitourinary: Negative for dysuria and difficulty urinating.  Musculoskeletal: Negative for back pain and joint swelling.  Skin: Negative for color change and rash.  Neurological: Negative for dizziness, light-headedness and headaches.  Psychiatric/Behavioral: Negative for dysphoric mood and agitation.       Increased stress as outlined.  Feels she is handling things well.          Objective:    Physical Exam  Constitutional: She appears well-developed and well-nourished. No distress.  HENT:  Nose: Nose normal.  Mouth/Throat: Oropharynx is clear and moist.  Eyes: Conjunctivae are normal. Right eye exhibits no discharge. Left eye exhibits no discharge.  Neck: Neck supple. No thyromegaly present.  Cardiovascular: Normal rate and regular rhythm.   Pulmonary/Chest: Breath sounds normal. No respiratory distress. She has no wheezes.  Abdominal: Soft. Bowel sounds are normal. There is no tenderness.  Musculoskeletal: She exhibits no edema or tenderness.  Lymphadenopathy:    She has no cervical adenopathy.  Skin: No rash noted. No erythema.  Psychiatric: She has a normal mood and affect. Her behavior is normal.    BP 120/62 mmHg  Pulse 62  Temp(Src) 97.7 F (36.5 C) (Oral)  Resp 18  Ht 5' 5.1" (1.654 m)  Wt 165 lb 4 oz (74.957 kg)  BMI 27.40 kg/m2  SpO2 97% Wt Readings from Last 3 Encounters:  12/10/15 165 lb 4 oz (74.957 kg)  10/12/15 167 lb 4 oz (75.864 kg)  08/10/15 168 lb 4 oz (76.318 kg)     Lab Results  Component Value Date   WBC 4.6 04/08/2015   HGB 13.5 04/08/2015   HCT 40.1 04/08/2015   PLT 203.0 04/08/2015   GLUCOSE 111* 09/03/2015   CHOL 158 09/03/2015   TRIG 49.0 09/03/2015   HDL 56.50 09/03/2015   LDLCALC 92 09/03/2015   ALT 25 09/03/2015   AST 20 09/03/2015   NA 143 09/03/2015   K 4.1 09/03/2015   CL 105 09/03/2015   CREATININE 0.76 09/03/2015   BUN 19 09/03/2015   CO2 29 09/03/2015   TSH 1.06 09/03/2015   HGBA1C 6.8* 09/03/2015   MICROALBUR 0.3 07/02/2014       Assessment & Plan:   Problem List Items Addressed This Visit    Breast cancer ([place])    Continues to f/u at [facility].  Mammogram 10/08/15 - birads II.        Carotid bruit    Carotid ultrasound 12/18/14 - 1-39% bilateral.  Recommended f/u carotid ultrasound in two years.  Continue daily aspirin.       Diabetes mellitus ([place])    Discussed diet and  exercise.  Monitor carb intake.  Check met b and a1c.   Lab Results  Component Value Date   HGBA1C 6.8* 09/03/2015        Relevant Orders   Hemoglobin A1c   Microalbumin / creatinine urine ratio   Health care maintenance    Mammogram 10/08/15 - Birads II.  PAP 07/06/14 - negative with negative HPV.  Colonoscopy 07/30/14 - recommended f/u in 5 years.       Hypercholesterolemia    On crestor.  Low cholesterol diet and exercise.  Follow lipid panel and liver function tests.   Lab Results  Component Value Date   CHOL 158 09/03/2015   HDL 56.50 09/03/2015   LDLCALC 92 09/03/2015   TRIG 49.0 09/03/2015   CHOLHDL 3 09/03/2015        Relevant Orders   Lipid panel   Hepatic function panel   Hypertension - Primary    Blood pressure under good control.  Continue same medication regimen.  Follow pressures.  Follow metabolic panel.        Relevant Orders   Basic metabolic panel       Einar Pheasant, MD

## 2015-12-10 NOTE — Progress Notes (Signed)
Pre-visit discussion using our clinic review tool. No additional management support is needed unless otherwise documented below in the visit note.  

## 2015-12-12 ENCOUNTER — Encounter: Payer: Self-pay | Admitting: Internal Medicine

## 2015-12-12 NOTE — Assessment & Plan Note (Signed)
Mammogram 10/08/15 - Birads II.  PAP 07/06/14 - negative with negative HPV.  Colonoscopy 07/30/14 - recommended f/u in 5 years.

## 2015-12-12 NOTE — Assessment & Plan Note (Signed)
Discussed diet and exercise.  Monitor carb intake.  Check met b and a1c.   Lab Results  Component Value Date   HGBA1C 6.8* 09/03/2015

## 2015-12-12 NOTE — Assessment & Plan Note (Signed)
Continues to f/u at [facility].  Mammogram 10/08/15 - birads II.

## 2015-12-12 NOTE — Assessment & Plan Note (Signed)
Blood pressure under good control.  Continue same medication regimen.  Follow pressures.  Follow metabolic panel.   

## 2015-12-12 NOTE — Assessment & Plan Note (Signed)
On crestor.  Low cholesterol diet and exercise.  Follow lipid panel and liver function tests.   Lab Results  Component Value Date   CHOL 158 09/03/2015   HDL 56.50 09/03/2015   LDLCALC 92 09/03/2015   TRIG 49.0 09/03/2015   CHOLHDL 3 09/03/2015

## 2015-12-12 NOTE — Assessment & Plan Note (Addendum)
Carotid ultrasound 12/18/14 - 1-39% bilateral.  Recommended f/u carotid ultrasound in two years.  Continue daily aspirin.

## 2015-12-23 ENCOUNTER — Encounter: Payer: Self-pay | Admitting: Internal Medicine

## 2015-12-24 ENCOUNTER — Other Ambulatory Visit: Payer: Managed Care, Other (non HMO)

## 2015-12-27 ENCOUNTER — Other Ambulatory Visit: Payer: Self-pay | Admitting: *Deleted

## 2015-12-27 ENCOUNTER — Encounter: Payer: Self-pay | Admitting: Internal Medicine

## 2015-12-27 MED ORDER — TELMISARTAN 20 MG PO TABS: 20.0000 mg | ORAL_TABLET | Freq: Every day | ORAL | Status: DC

## 2015-12-27 MED ORDER — ESOMEPRAZOLE MAGNESIUM 40 MG PO CPDR: 40.0000 mg | DELAYED_RELEASE_CAPSULE | Freq: Every day | ORAL | Status: DC

## 2015-12-27 MED ORDER — METFORMIN HCL 500 MG PO TABS
500.0000 mg | ORAL_TABLET | Freq: Every day | ORAL | Status: DC
Start: 2015-12-27 — End: 2016-12-05

## 2015-12-27 MED ORDER — ROSUVASTATIN CALCIUM 10 MG PO TABS: 10.0000 mg | ORAL_TABLET | Freq: Every day | ORAL | Status: DC

## 2016-01-19 ENCOUNTER — Other Ambulatory Visit (INDEPENDENT_AMBULATORY_CARE_PROVIDER_SITE_OTHER): Payer: Managed Care, Other (non HMO)

## 2016-01-19 DIAGNOSIS — E119 Type 2 diabetes mellitus without complications: Secondary | ICD-10-CM

## 2016-01-19 DIAGNOSIS — I10 Essential (primary) hypertension: Secondary | ICD-10-CM

## 2016-01-19 DIAGNOSIS — E7800 Pure hypercholesterolemia, unspecified: Secondary | ICD-10-CM | POA: Diagnosis not present

## 2016-01-19 LAB — LIPID PANEL
CHOL/HDL RATIO: 3
CHOLESTEROL: 181 mg/dL (ref 0–200)
HDL: 62 mg/dL (ref >39.00)
LDL CALC: 107 mg/dL — AB (ref 0–99)
NonHDL: 119.18
TRIGLYCERIDES: 62 mg/dL (ref 0.0–149.0)
VLDL: 12.4 mg/dL (ref 0.0–40.0)

## 2016-01-19 LAB — BASIC METABOLIC PANEL
BUN: 16 mg/dL (ref 6–23)
CALCIUM: 9.3 mg/dL (ref 8.4–10.5)
CO2: 30 mEq/L (ref 19–32)
Chloride: 104 mEq/L (ref 96–112)
Creatinine, Ser: 0.74 mg/dL (ref 0.40–1.20)
GFR: 84.01 mL/min (ref >60.00)
Glucose, Bld: 126 mg/dL — ABNORMAL HIGH (ref 70–99)
POTASSIUM: 4 meq/L (ref 3.5–5.1)
SODIUM: 141 meq/L (ref 135–145)

## 2016-01-19 LAB — HEPATIC FUNCTION PANEL
ALT: 18 U/L (ref 0–35)
AST: 17 U/L (ref 0–37)
Albumin: 4.3 g/dL (ref 3.5–5.2)
Alkaline Phosphatase: 76 U/L (ref 39–117)
BILIRUBIN DIRECT: 0.1 mg/dL (ref 0.0–0.3)
TOTAL PROTEIN: 6.4 g/dL (ref 6.0–8.3)
Total Bilirubin: 0.4 mg/dL (ref 0.2–1.2)

## 2016-01-19 LAB — MICROALBUMIN / CREATININE URINE RATIO
CREATININE, U: 96.1 mg/dL
MICROALB/CREAT RATIO: 0.7 mg/g (ref 0.0–30.0)

## 2016-01-19 LAB — HEMOGLOBIN A1C: HEMOGLOBIN A1C: 7 % — AB (ref 4.6–6.5)

## 2016-01-20 ENCOUNTER — Encounter: Payer: Self-pay | Admitting: Internal Medicine

## 2016-02-01 ENCOUNTER — Telehealth: Payer: Self-pay | Admitting: Internal Medicine

## 2016-02-01 DIAGNOSIS — E7800 Pure hypercholesterolemia, unspecified: Secondary | ICD-10-CM

## 2016-02-01 DIAGNOSIS — E119 Type 2 diabetes mellitus without complications: Secondary | ICD-10-CM

## 2016-02-01 NOTE — Telephone Encounter (Signed)
Please advise, thanks.

## 2016-02-01 NOTE — Telephone Encounter (Signed)
Pt called to schedule for fasting lab. Need orders please and thank you! Call pt @ [phone]. Thank you!

## 2016-02-02 NOTE — Telephone Encounter (Signed)
Order placed for labs.  She needs a fasting lab appt in 3 months.  Please schedule and notify pt of appt date and time.  Thanks.    Dr Nicki Reaper

## 2016-02-02 NOTE — Telephone Encounter (Signed)
Lm on pt vm to schedule fasting lab appt in June.. Please schedule when pt calls back.. Thanks

## 2016-02-14 ENCOUNTER — Encounter: Payer: Self-pay | Admitting: Internal Medicine

## 2016-02-14 NOTE — Telephone Encounter (Signed)
Chart updated

## 2016-04-20 ENCOUNTER — Other Ambulatory Visit (INDEPENDENT_AMBULATORY_CARE_PROVIDER_SITE_OTHER): Payer: Managed Care, Other (non HMO)

## 2016-04-20 DIAGNOSIS — E119 Type 2 diabetes mellitus without complications: Secondary | ICD-10-CM | POA: Diagnosis not present

## 2016-04-20 DIAGNOSIS — E7800 Pure hypercholesterolemia, unspecified: Secondary | ICD-10-CM | POA: Diagnosis not present

## 2016-04-20 LAB — HEPATIC FUNCTION PANEL
ALK PHOS: 74 U/L (ref 39–117)
ALT: 16 U/L (ref 0–35)
AST: 16 U/L (ref 0–37)
Albumin: 4.3 g/dL (ref 3.5–5.2)
BILIRUBIN TOTAL: 0.6 mg/dL (ref 0.2–1.2)
Bilirubin, Direct: 0.1 mg/dL (ref 0.0–0.3)
Total Protein: 6.5 g/dL (ref 6.0–8.3)

## 2016-04-20 LAB — LIPID PANEL
Cholesterol: 156 mg/dL (ref 0–200)
HDL: 59.6 mg/dL (ref >39.00)
LDL Cholesterol: 87 mg/dL (ref 0–99)
NONHDL: 96.88
Total CHOL/HDL Ratio: 3
Triglycerides: 51 mg/dL (ref 0.0–149.0)
VLDL: 10.2 mg/dL (ref 0.0–40.0)

## 2016-04-20 LAB — BASIC METABOLIC PANEL
BUN: 21 mg/dL (ref 6–23)
CHLORIDE: 106 meq/L (ref 96–112)
CO2: 29 meq/L (ref 19–32)
CREATININE: 0.77 mg/dL (ref 0.40–1.20)
Calcium: 9.1 mg/dL (ref 8.4–10.5)
GFR: 80.18 mL/min (ref >60.00)
GLUCOSE: 128 mg/dL — AB (ref 70–99)
POTASSIUM: 3.7 meq/L (ref 3.5–5.1)
Sodium: 141 mEq/L (ref 135–145)

## 2016-04-20 LAB — HEMOGLOBIN A1C: Hgb A1c MFr Bld: 6.7 % — ABNORMAL HIGH (ref 4.6–6.5)

## 2016-04-21 ENCOUNTER — Encounter: Payer: Self-pay | Admitting: Internal Medicine

## 2016-05-29 ENCOUNTER — Encounter: Payer: Self-pay | Admitting: Internal Medicine

## 2016-07-07 LAB — HM DIABETES EYE EXAM

## 2016-07-12 ENCOUNTER — Encounter: Payer: Self-pay | Admitting: Internal Medicine

## 2016-08-11 ENCOUNTER — Encounter: Payer: Self-pay | Admitting: Internal Medicine

## 2016-08-11 ENCOUNTER — Ambulatory Visit (INDEPENDENT_AMBULATORY_CARE_PROVIDER_SITE_OTHER): Payer: Managed Care, Other (non HMO) | Admitting: Internal Medicine

## 2016-08-11 VITALS — BP 134/62 | HR 72 | Temp 97.5°F | Ht 65.0 in | Wt 167.8 lb

## 2016-08-11 DIAGNOSIS — E119 Type 2 diabetes mellitus without complications: Secondary | ICD-10-CM

## 2016-08-11 DIAGNOSIS — C50919 Malignant neoplasm of unspecified site of unspecified female breast: Secondary | ICD-10-CM

## 2016-08-11 DIAGNOSIS — Z23 Encounter for immunization: Secondary | ICD-10-CM | POA: Diagnosis not present

## 2016-08-11 DIAGNOSIS — I10 Essential (primary) hypertension: Secondary | ICD-10-CM | POA: Diagnosis not present

## 2016-08-11 DIAGNOSIS — Z0000 Encounter for general adult medical examination without abnormal findings: Secondary | ICD-10-CM

## 2016-08-11 DIAGNOSIS — E7800 Pure hypercholesterolemia, unspecified: Secondary | ICD-10-CM | POA: Diagnosis not present

## 2016-08-11 DIAGNOSIS — D649 Anemia, unspecified: Secondary | ICD-10-CM | POA: Diagnosis not present

## 2016-08-11 DIAGNOSIS — G459 Transient cerebral ischemic attack, unspecified: Secondary | ICD-10-CM

## 2016-08-11 DIAGNOSIS — R0989 Other specified symptoms and signs involving the circulatory and respiratory systems: Secondary | ICD-10-CM

## 2016-08-11 NOTE — Assessment & Plan Note (Signed)
Blood pressure under good control.  Continue same medication regimen.  Follow pressures.  Follow metabolic panel.   

## 2016-08-11 NOTE — Assessment & Plan Note (Signed)
Physical today 08/11/16.  PAP 07/06/14 negative with negative HPV.  Colonoscopy 07/30/14.  Recommended f/u in five years.

## 2016-08-11 NOTE — Progress Notes (Signed)
Patient ID: Angel Floyd, female   DOB: 12/23/1950, 65 y.o.   MRN: [id number]   Subjective:    Patient ID: Angel Floyd, female    DOB: 08/05/1951, 65 y.o.   MRN: [id number]  HPI  Patient here for her physical exam.  She is doing well.  Feels good.  No chest pain.  No sob.  No acid reflux.  Eating and drinking well.  No abdominal pain or cramping.  Bowels stable.  Was out of her routine with watching her diet.  Back in routine now.  Has started back walking.  Sugars averaging 130s.  Gives blood four times per year.  Went to give recently and was told hgb was slightly decreased.  Overall doing well.  Handling stress well.     Past Medical History:  Diagnosis Date  . Anemia   . Breast cancer (HCC)   . Diabetes mellitus (HCC)   . History of abnormal Pap smear    class III, required cryosurgery  . Hypercholesterolemia   . Hypertension   . TIA (transient ischemic attack)    Past Surgical History:  Procedure Laterality Date  . BREAST CYST ASPIRATION  1989  . DILATION AND CURETTAGE OF UTERUS    . FOOT SURGERY     morton's neuroma removed - right foot  . FOOT SURGERY  10/2009   achilles tendon release with reconstructive surgery  . GYNECOLOGIC CRYOSURGERY     class III pap  . HAND SURGERY     cyst removed - left hand  . hysteroscopy and D&C  7/09  . KNEE ARTHROSCOPY Left [id number]   removed tear & resurfaced area  . KNEE SURGERY  09/20/12   torn meniscus, (Dr Harold [facility]le)  . TUBAL LIGATION     Family History  Problem Relation Age of Onset  . Lung cancer Father   . Alcohol abuse Father   . Cancer Father     lung  . CVA Mother   . Colon cancer Mother   . Alcohol abuse Mother   . Cancer Mother     colon  . Stroke Mother   . Colon cancer Sister   . Diabetes Maternal Grandfather    Social History   Social History  . Marital status: Married    Spouse name: N/A  . Number of children: 2  . Years of education: N/A   Social History Main Topics  . Smoking status:  Never Smoker  . Smokeless tobacco: Never Used  . Alcohol use No  . Drug use: No  . Sexual activity: Not Asked   Other Topics Concern  . None   Social History Narrative  . None    Outpatient Encounter Prescriptions as of 08/11/2016  Medication Sig  . aspirin 81 MG tablet Take 81 mg by mouth daily.  . esomeprazole (NEXIUM) 40 MG capsule Take 1 capsule (40 mg total) by mouth daily before breakfast.  . fish oil-omega-3 fatty acids 1000 MG capsule Take 2 g by mouth daily.   . glucose blood test strip Use as instructed to check blood sugars three times daily. Has One Touch Ultra glucometer. Dx 250.00  . metFORMIN (GLUCOPHAGE) 500 MG tablet Take 1 tablet (500 mg total) by mouth daily.  . rosuvastatin (CRESTOR) 10 MG tablet Take 1 tablet (10 mg total) by mouth daily.  . telmisartan (MICARDIS) 20 MG tablet Take 1 tablet (20 mg total) by mouth daily.  . traZODone (DESYREL) 50 MG tablet Take 0.5-1 tablets (25-50 mg   total) by mouth at bedtime as needed for sleep.   No facility-administered encounter medications on file as of 08/11/2016.     Review of Systems  Constitutional: Negative for appetite change and unexpected weight change.  HENT: Negative for congestion and sinus pressure.   Eyes: Negative for pain and visual disturbance.  Respiratory: Negative for cough, chest tightness and shortness of breath.   Cardiovascular: Negative for chest pain, palpitations and leg swelling.  Gastrointestinal: Negative for abdominal pain, diarrhea, nausea and vomiting.  Genitourinary: Negative for difficulty urinating and dysuria.  Musculoskeletal: Negative for back pain and joint swelling.  Skin: Negative for color change and rash.  Neurological: Negative for dizziness, light-headedness and headaches.  Hematological: Negative for adenopathy. Does not bruise/bleed easily.  Psychiatric/Behavioral: Negative for agitation and dysphoric mood.       Objective:    Physical Exam  Constitutional: She is  oriented to person, place, and time. She appears well-developed and well-nourished. No distress.  HENT:  Nose: Nose normal.  Mouth/Throat: Oropharynx is clear and moist.  Eyes: Right eye exhibits no discharge. Left eye exhibits no discharge. No scleral icterus.  Neck: Neck supple. No thyromegaly present.  Cardiovascular: Normal rate and regular rhythm.   Pulmonary/Chest: Breath sounds normal. No accessory muscle usage. No tachypnea. No respiratory distress. She has no decreased breath sounds. She has no wheezes. She has no rhonchi. Right breast exhibits no inverted nipple, no mass, no nipple discharge and no tenderness (no axillary adenopathy). Left breast exhibits no inverted nipple, no mass, no nipple discharge and no tenderness (no axilarry adenopathy).  Abdominal: Soft. Bowel sounds are normal. There is no tenderness.  Musculoskeletal: She exhibits no edema or tenderness.  Lymphadenopathy:    She has no cervical adenopathy.  Neurological: She is alert and oriented to person, place, and time.  Skin: Skin is warm. No rash noted. No erythema.  Psychiatric: She has a normal mood and affect. Her behavior is normal.    BP 134/62   Pulse 72   Temp 97.5 F (36.4 C) (Oral)   Ht 5' 5" (1.651 m)   Wt 167 lb 12.8 oz (76.1 kg)   SpO2 98%   BMI 27.92 kg/m  Wt Readings from Last 3 Encounters:  08/11/16 167 lb 12.8 oz (76.1 kg)  12/10/15 165 lb 4 oz (75 kg)  10/12/15 167 lb 4 oz (75.9 kg)     Lab Results  Component Value Date   WBC 4.6 04/08/2015   HGB 13.5 04/08/2015   HCT 40.1 04/08/2015   PLT 203.0 04/08/2015   GLUCOSE 128 (H) 04/20/2016   CHOL 156 04/20/2016   TRIG 51.0 04/20/2016   HDL 59.60 04/20/2016   LDLCALC 87 04/20/2016   ALT 16 04/20/2016   AST 16 04/20/2016   NA 141 04/20/2016   K 3.7 04/20/2016   CL 106 04/20/2016   CREATININE 0.77 04/20/2016   BUN 21 04/20/2016   CO2 29 04/20/2016   TSH 1.06 09/03/2015   HGBA1C 6.7 (H) 04/20/2016   MICROALBUR <0.7 01/19/2016         Assessment & Plan:   Problem List Items Addressed This Visit    Anemia    Recently found when giving blood.  Will schedule cbc and ferritin.        Relevant Orders   CBC with Differential/Platelet   Ferritin   IBC panel   Breast cancer ([place])    Continues to f/u at [facility].  Last mammogram 10/08/15 - birads II.  Scheduled  for f/u.       Carotid bruit    Carotid ultrasound 12/18/14 - 1-39% bilateral.  Recommended f/u carotid ultrasound in two years.        Diabetes mellitus (HCC)    Low carb diet and exercise.  Follow met b and a1c.   Lab Results  Component Value Date   HGBA1C 6.7 (H) 04/20/2016        Relevant Orders   Hemoglobin A1c   Basic metabolic panel   Health care maintenance    Physical today 08/11/16.  PAP 07/06/14 negative with negative HPV.  Colonoscopy 07/30/14.  Recommended f/u in five years.        Hypercholesterolemia    On crestor.  Low cholesterol diet and exercise.  Follow lipid panel and liver function tests.        Relevant Orders   Lipid panel   Hepatic function panel   Hypertension    Blood pressure under good control.  Continue same medication regimen.  Follow pressures.  Follow metabolic panel.        Relevant Orders   TSH   TIA (transient ischemic attack)    On aspirin.  No reoccurring symptoms.         Other Visit Diagnoses    Routine general medical examination at a health care facility    -  Primary   Encounter for immunization       Relevant Orders   Flu Vaccine QUAD 36+ mos IM (Completed)       SCOTT, CHARLENE, MD  

## 2016-08-11 NOTE — Progress Notes (Signed)
Pre visit review using our clinic review tool, if applicable. No additional management support is needed unless otherwise documented below in the visit note. 

## 2016-08-13 ENCOUNTER — Encounter: Payer: Self-pay | Admitting: Internal Medicine

## 2016-08-13 NOTE — Assessment & Plan Note (Signed)
Recently found when giving blood.  Will schedule cbc and ferritin.

## 2016-08-13 NOTE — Assessment & Plan Note (Signed)
On crestor.  Low cholesterol diet and exercise.  Follow lipid panel and liver function tests.   

## 2016-08-13 NOTE — Assessment & Plan Note (Signed)
On aspirin.  No reoccurring symptoms.  

## 2016-08-13 NOTE — Assessment & Plan Note (Signed)
Low carb diet and exercise.  Follow met b and a1c.   Lab Results  Component Value Date   HGBA1C 6.7 (H) 04/20/2016

## 2016-08-13 NOTE — Assessment & Plan Note (Signed)
Carotid ultrasound 12/18/14 - 1-39% bilateral.  Recommended f/u carotid ultrasound in two years.

## 2016-08-13 NOTE — Assessment & Plan Note (Signed)
Continues to f/u at [facility].  Last mammogram 10/08/15 - birads II.  Scheduled for f/u.

## 2016-10-11 ENCOUNTER — Other Ambulatory Visit (INDEPENDENT_AMBULATORY_CARE_PROVIDER_SITE_OTHER): Payer: Managed Care, Other (non HMO)

## 2016-10-11 DIAGNOSIS — E7800 Pure hypercholesterolemia, unspecified: Secondary | ICD-10-CM | POA: Diagnosis not present

## 2016-10-11 DIAGNOSIS — E119 Type 2 diabetes mellitus without complications: Secondary | ICD-10-CM | POA: Diagnosis not present

## 2016-10-11 DIAGNOSIS — D649 Anemia, unspecified: Secondary | ICD-10-CM | POA: Diagnosis not present

## 2016-10-11 DIAGNOSIS — I10 Essential (primary) hypertension: Secondary | ICD-10-CM | POA: Diagnosis not present

## 2016-10-11 LAB — HEPATIC FUNCTION PANEL
ALK PHOS: 67 U/L (ref 39–117)
ALT: 14 U/L (ref 0–35)
AST: 16 U/L (ref 0–37)
Albumin: 4 g/dL (ref 3.5–5.2)
BILIRUBIN DIRECT: 0.1 mg/dL (ref 0.0–0.3)
BILIRUBIN TOTAL: 0.5 mg/dL (ref 0.2–1.2)
TOTAL PROTEIN: 6.3 g/dL (ref 6.0–8.3)

## 2016-10-11 LAB — CBC WITH DIFFERENTIAL/PLATELET
BASOS ABS: 0 10*3/uL (ref 0.0–0.1)
BASOS PCT: 0.4 % (ref 0.0–3.0)
EOS ABS: 0.1 10*3/uL (ref 0.0–0.7)
Eosinophils Relative: 2.9 % (ref 0.0–5.0)
HEMATOCRIT: 34.4 % — AB (ref 36.0–46.0)
Hemoglobin: 11.3 g/dL — ABNORMAL LOW (ref 12.0–15.0)
LYMPHS ABS: 1.3 10*3/uL (ref 0.7–4.0)
LYMPHS PCT: 31.9 % (ref 12.0–46.0)
MCHC: 32.8 g/dL (ref 30.0–36.0)
MCV: 82.2 fl (ref 78.0–100.0)
MONO ABS: 0.3 10*3/uL (ref 0.1–1.0)
Monocytes Relative: 7.6 % (ref 3.0–12.0)
NEUTROS ABS: 2.3 10*3/uL (ref 1.4–7.7)
NEUTROS PCT: 57.2 % (ref 43.0–77.0)
PLATELETS: 181 10*3/uL (ref 150.0–400.0)
RBC: 4.18 Mil/uL (ref 3.87–5.11)
RDW: 15.1 % (ref 11.5–15.5)
WBC: 4 10*3/uL (ref 4.0–10.5)

## 2016-10-11 LAB — BASIC METABOLIC PANEL
BUN: 17 mg/dL (ref 6–23)
CALCIUM: 9 mg/dL (ref 8.4–10.5)
CO2: 28 meq/L (ref 19–32)
Chloride: 108 mEq/L (ref 96–112)
Creatinine, Ser: 0.73 mg/dL (ref 0.40–1.20)
GFR: 85.14 mL/min (ref >60.00)
GLUCOSE: 122 mg/dL — AB (ref 70–99)
Potassium: 3.6 mEq/L (ref 3.5–5.1)
SODIUM: 144 meq/L (ref 135–145)

## 2016-10-11 LAB — FERRITIN: Ferritin: 8 ng/mL — ABNORMAL LOW (ref 10.0–291.0)

## 2016-10-11 LAB — LIPID PANEL
CHOLESTEROL: 134 mg/dL (ref 0–200)
HDL: 56.3 mg/dL (ref >39.00)
LDL CALC: 69 mg/dL (ref 0–99)
NonHDL: 77.66
TRIGLYCERIDES: 44 mg/dL (ref 0.0–149.0)
Total CHOL/HDL Ratio: 2
VLDL: 8.8 mg/dL (ref 0.0–40.0)

## 2016-10-11 LAB — IBC PANEL
Iron: 47 ug/dL (ref 42–145)
SATURATION RATIOS: 16.5 % — AB (ref 20.0–50.0)
TRANSFERRIN: 204 mg/dL — AB (ref 212.0–360.0)

## 2016-10-11 LAB — TSH: TSH: 1.06 u[IU]/mL (ref 0.35–4.50)

## 2016-10-11 LAB — HEMOGLOBIN A1C: Hgb A1c MFr Bld: 6.9 % — ABNORMAL HIGH (ref 4.6–6.5)

## 2016-10-16 ENCOUNTER — Other Ambulatory Visit: Payer: Self-pay | Admitting: Internal Medicine

## 2016-10-16 DIAGNOSIS — D509 Iron deficiency anemia, unspecified: Secondary | ICD-10-CM

## 2016-10-16 NOTE — Progress Notes (Signed)
Order placed for f/u cbc and ferritin.   

## 2016-10-17 ENCOUNTER — Telehealth: Payer: Self-pay | Admitting: *Deleted

## 2016-10-17 ENCOUNTER — Telehealth: Payer: Self-pay

## 2016-10-17 NOTE — Telephone Encounter (Signed)
-----   Message from Einar Pheasant, MD sent at 10/16/2016  5:36 AM EST ----- Please call and notify pt that her hgb and iron stores are slightly decreased.  Has she been giving blood recently?  If so, need to hold on giving.  Need to start iron supplements.  Start ferrous sulfate 325mg  q day.  Recheck cbc and ferritin in 4 weeks.  Cholesterol looks good.  Overall sugar control ok - a1c 6.9.  Thyroid test, kidney function tests and liver function tests are wnl.

## 2016-10-17 NOTE — Telephone Encounter (Signed)
Pt requested a call to call to discuss lab results, and she would like to know which pharmacy her Rx was sent to. Pt contact [phone]

## 2016-10-17 NOTE — Telephone Encounter (Signed)
Patient notified, patient states she is going back to Dr Ubaldo Glassing for echo and stress test 11/03/16

## 2016-10-18 NOTE — Telephone Encounter (Signed)
Just fyi.

## 2016-10-18 NOTE — Telephone Encounter (Signed)
Noted.  Was this just fyi or did she need [place] to do anything?

## 2016-10-25 ENCOUNTER — Encounter: Payer: Self-pay | Admitting: Internal Medicine

## 2016-11-14 ENCOUNTER — Other Ambulatory Visit: Payer: Managed Care, Other (non HMO)

## 2016-11-15 ENCOUNTER — Encounter: Payer: Self-pay | Admitting: Internal Medicine

## 2016-11-15 ENCOUNTER — Other Ambulatory Visit (INDEPENDENT_AMBULATORY_CARE_PROVIDER_SITE_OTHER): Payer: Managed Care, Other (non HMO)

## 2016-11-15 DIAGNOSIS — D509 Iron deficiency anemia, unspecified: Secondary | ICD-10-CM | POA: Diagnosis not present

## 2016-11-15 LAB — FERRITIN: FERRITIN: 21.5 ng/mL (ref 10.0–291.0)

## 2016-11-15 LAB — CBC WITH DIFFERENTIAL/PLATELET
BASOS ABS: 0 10*3/uL (ref 0.0–0.1)
Basophils Relative: 0.5 % (ref 0.0–3.0)
EOS ABS: 0.2 10*3/uL (ref 0.0–0.7)
Eosinophils Relative: 3 % (ref 0.0–5.0)
HEMATOCRIT: 38.5 % (ref 36.0–46.0)
HEMOGLOBIN: 12.9 g/dL (ref 12.0–15.0)
LYMPHS PCT: 27.1 % (ref 12.0–46.0)
Lymphs Abs: 1.6 10*3/uL (ref 0.7–4.0)
MCHC: 33.7 g/dL (ref 30.0–36.0)
MCV: 83.6 fl (ref 78.0–100.0)
MONOS PCT: 5.8 % (ref 3.0–12.0)
Monocytes Absolute: 0.3 10*3/uL (ref 0.1–1.0)
NEUTROS ABS: 3.8 10*3/uL (ref 1.4–7.7)
Neutrophils Relative %: 63.6 % (ref 43.0–77.0)
Platelets: 183 10*3/uL (ref 150.0–400.0)
RBC: 4.6 Mil/uL (ref 3.87–5.11)
RDW: 17.1 % — ABNORMAL HIGH (ref 11.5–15.5)
WBC: 5.9 10*3/uL (ref 4.0–10.5)

## 2016-11-16 ENCOUNTER — Other Ambulatory Visit: Payer: Managed Care, Other (non HMO)

## 2016-12-05 ENCOUNTER — Other Ambulatory Visit: Payer: Self-pay | Admitting: Internal Medicine

## 2017-01-09 ENCOUNTER — Telehealth: Payer: Self-pay | Admitting: *Deleted

## 2017-01-09 NOTE — Telephone Encounter (Signed)
Spoke with patient and stated that she is due for a Carotid Ultrasound (1 year f/u). Patient said that she is out of town and will call back to schedule.

## 2017-01-26 ENCOUNTER — Other Ambulatory Visit: Payer: Self-pay | Admitting: Internal Medicine

## 2017-01-26 DIAGNOSIS — I6523 Occlusion and stenosis of bilateral carotid arteries: Secondary | ICD-10-CM

## 2017-02-08 ENCOUNTER — Ambulatory Visit (INDEPENDENT_AMBULATORY_CARE_PROVIDER_SITE_OTHER): Payer: Managed Care, Other (non HMO) | Admitting: Internal Medicine

## 2017-02-08 ENCOUNTER — Encounter: Payer: Self-pay | Admitting: Internal Medicine

## 2017-02-08 VITALS — BP 126/72 | HR 65 | Temp 98.6°F | Resp 16 | Ht 65.0 in | Wt 157.6 lb

## 2017-02-08 DIAGNOSIS — R0989 Other specified symptoms and signs involving the circulatory and respiratory systems: Secondary | ICD-10-CM

## 2017-02-08 DIAGNOSIS — C50919 Malignant neoplasm of unspecified site of unspecified female breast: Secondary | ICD-10-CM | POA: Diagnosis not present

## 2017-02-08 DIAGNOSIS — R194 Change in bowel habit: Secondary | ICD-10-CM | POA: Diagnosis not present

## 2017-02-08 DIAGNOSIS — E7800 Pure hypercholesterolemia, unspecified: Secondary | ICD-10-CM

## 2017-02-08 DIAGNOSIS — E119 Type 2 diabetes mellitus without complications: Secondary | ICD-10-CM | POA: Diagnosis not present

## 2017-02-08 DIAGNOSIS — D649 Anemia, unspecified: Secondary | ICD-10-CM

## 2017-02-08 DIAGNOSIS — R198 Other specified symptoms and signs involving the digestive system and abdomen: Secondary | ICD-10-CM

## 2017-02-08 DIAGNOSIS — I10 Essential (primary) hypertension: Secondary | ICD-10-CM | POA: Diagnosis not present

## 2017-02-08 LAB — FERRITIN: Ferritin: 25.2 ng/mL (ref 10.0–291.0)

## 2017-02-08 LAB — CBC WITH DIFFERENTIAL/PLATELET
BASOS ABS: 0 10*3/uL (ref 0.0–0.1)
Basophils Relative: 0.8 % (ref 0.0–3.0)
EOS ABS: 0.1 10*3/uL (ref 0.0–0.7)
Eosinophils Relative: 3.1 % (ref 0.0–5.0)
HEMATOCRIT: 38.5 % (ref 36.0–46.0)
Hemoglobin: 13 g/dL (ref 12.0–15.0)
LYMPHS ABS: 1.3 10*3/uL (ref 0.7–4.0)
Lymphocytes Relative: 27.8 % (ref 12.0–46.0)
MCHC: 33.7 g/dL (ref 30.0–36.0)
MCV: 88.4 fl (ref 78.0–100.0)
MONO ABS: 0.3 10*3/uL (ref 0.1–1.0)
Monocytes Relative: 6.1 % (ref 3.0–12.0)
Neutro Abs: 3 10*3/uL (ref 1.4–7.7)
Neutrophils Relative %: 62.2 % (ref 43.0–77.0)
PLATELETS: 205 10*3/uL (ref 150.0–400.0)
RBC: 4.35 Mil/uL (ref 3.87–5.11)
RDW: 13.3 % (ref 11.5–15.5)
WBC: 4.8 10*3/uL (ref 4.0–10.5)

## 2017-02-08 LAB — BASIC METABOLIC PANEL
BUN: 16 mg/dL (ref 6–23)
CO2: 31 mEq/L (ref 19–32)
CREATININE: 0.8 mg/dL (ref 0.40–1.20)
Calcium: 9.6 mg/dL (ref 8.4–10.5)
Chloride: 107 mEq/L (ref 96–112)
GFR: 76.52 mL/min (ref >60.00)
Glucose, Bld: 131 mg/dL — ABNORMAL HIGH (ref 70–99)
POTASSIUM: 3.9 meq/L (ref 3.5–5.1)
Sodium: 143 mEq/L (ref 135–145)

## 2017-02-08 LAB — LIPID PANEL
CHOL/HDL RATIO: 3
Cholesterol: 159 mg/dL (ref 0–200)
HDL: 60.3 mg/dL (ref >39.00)
LDL CALC: 88 mg/dL (ref 0–99)
NONHDL: 98.51
TRIGLYCERIDES: 51 mg/dL (ref 0.0–149.0)
VLDL: 10.2 mg/dL (ref 0.0–40.0)

## 2017-02-08 LAB — MICROALBUMIN / CREATININE URINE RATIO
Creatinine,U: 96.2 mg/dL
Microalb Creat Ratio: 0.7 mg/g (ref 0.0–30.0)

## 2017-02-08 LAB — HEPATIC FUNCTION PANEL
ALK PHOS: 67 U/L (ref 39–117)
ALT: 14 U/L (ref 0–35)
AST: 14 U/L (ref 0–37)
Albumin: 4.2 g/dL (ref 3.5–5.2)
BILIRUBIN DIRECT: 0.1 mg/dL (ref 0.0–0.3)
TOTAL PROTEIN: 6.1 g/dL (ref 6.0–8.3)
Total Bilirubin: 0.5 mg/dL (ref 0.2–1.2)

## 2017-02-08 LAB — HEMOGLOBIN A1C: Hgb A1c MFr Bld: 6.5 % (ref 4.6–6.5)

## 2017-02-08 MED ORDER — INTEGRA 62.5-62.5-40-3 MG PO CAPS: 1.0000 | ORAL_CAPSULE | Freq: Every day | ORAL | 0 refills | Status: DC

## 2017-02-08 NOTE — Progress Notes (Signed)
Pre-visit discussion using our clinic review tool. No additional management support is needed unless otherwise documented below in the visit note.  

## 2017-02-08 NOTE — Progress Notes (Signed)
Patient ID: Angel Floyd, female   DOB: May 03, 1951, 66 y.o.   MRN: [id number]   Subjective:    Patient ID: Angel Floyd, female    DOB: [birth year], 66 y.o.   MRN: [id number]  HPI  Patient here for a scheduled follow up.  She was having some increased sob.  Saw cardiology.  Had Holter - SR with rare SVT.  Had negative functional study.  ECHO with mild MR, TR and AI.  No chest pain.  No sob now.  Has adjusted her diet.  Lost weight.  Feels better.  Is exercising.  She is having increased gas.  Some increased acid reflux.  Some intermittent diarrhea.  No increased abdominal pain.     Past Medical History:  Diagnosis Date  . Anemia   . Breast cancer ([place])   . Diabetes mellitus ([place])   . History of abnormal Pap smear    class III, required cryosurgery  . Hypercholesterolemia   . Hypertension   . TIA (transient ischemic attack)    Past Surgical History:  Procedure Laterality Date  . BREAST CYST ASPIRATION  1989  . DILATION AND CURETTAGE OF UTERUS    . FOOT SURGERY     morton's neuroma removed - right foot  . FOOT SURGERY  10/2009   achilles tendon release with reconstructive surgery  . GYNECOLOGIC CRYOSURGERY     class III pap  . HAND SURGERY     cyst removed - left hand  . hysteroscopy and D&C  7/09  . KNEE ARTHROSCOPY Left [id number]   removed tear & resurfaced area  . KNEE SURGERY  09/20/12   torn meniscus, (Dr Leanor Kail)  . TUBAL LIGATION     Family History  Problem Relation Age of Onset  . Lung cancer Father   . Alcohol abuse Father   . Cancer Father     lung  . CVA Mother   . Colon cancer Mother   . Alcohol abuse Mother   . Cancer Mother     colon  . Stroke Mother   . Colon cancer Sister   . Diabetes Maternal Grandfather    Social History   Social History  . Marital status: Married    Spouse name: N/A  . Number of children: 2  . Years of education: N/A   Social History Main Topics  . Smoking status: Never Smoker  . Smokeless tobacco: Never Used    . Alcohol use No  . Drug use: No  . Sexual activity: Not Asked   Other Topics Concern  . None   Social History Narrative  . None    Outpatient Encounter Prescriptions as of 02/08/2017  Medication Sig  . aspirin 81 MG tablet Take 81 mg by mouth daily.  Marland Kitchen esomeprazole (NEXIUM) 40 MG capsule TAKE 1 CAPSULE BY MOUTH  DAILY BEFORE BREAKFAST  . fish oil-omega-3 fatty acids 1000 MG capsule Take 2 g by mouth daily.   Marland Kitchen glucose blood test strip Use as instructed to check blood sugars three times daily. Has One Touch Ultra glucometer. Dx 250.00  . metFORMIN (GLUCOPHAGE) 500 MG tablet TAKE 1 TABLET BY MOUTH  DAILY  . rosuvastatin (CRESTOR) 10 MG tablet TAKE 1 TABLET BY MOUTH  DAILY  . telmisartan (MICARDIS) 20 MG tablet TAKE 1 TABLET BY MOUTH  DAILY  . traZODone (DESYREL) 50 MG tablet Take 0.5-1 tablets (25-50 mg total) by mouth at bedtime as needed for sleep.  . [DISCONTINUED] ferrous sulfate 325 (65 FE)  MG tablet Take 1 tablet by mouth daily.  . Fe Fum-FePoly-Vit C-Vit B3 (INTEGRA) 62.5-62.5-40-3 MG CAPS Take 1 capsule by mouth daily.   No facility-administered encounter medications on file as of 02/08/2017.     Review of Systems  Constitutional: Negative for appetite change and unexpected weight change.  HENT: Negative for congestion and sinus pressure.   Respiratory: Negative for cough, chest tightness and shortness of breath.   Cardiovascular: Negative for chest pain, palpitations and leg swelling.  Gastrointestinal: Positive for diarrhea. Negative for abdominal pain, nausea and vomiting.  Genitourinary: Negative for difficulty urinating and dysuria.  Musculoskeletal: Negative for back pain and joint swelling.  Skin: Negative for color change and rash.  Neurological: Negative for dizziness, light-headedness and headaches.  Psychiatric/Behavioral: Negative for agitation and dysphoric mood.       Objective:    Physical Exam  Constitutional: She appears well-developed and  well-nourished. No distress.  HENT:  Nose: Nose normal.  Mouth/Throat: Oropharynx is clear and moist.  Neck: Neck supple. No thyromegaly present.  Cardiovascular: Normal rate and regular rhythm.   Pulmonary/Chest: Breath sounds normal. No respiratory distress. She has no wheezes.  Abdominal: Soft. Bowel sounds are normal. There is no tenderness.  Musculoskeletal: She exhibits no edema or tenderness.  Lymphadenopathy:    She has no cervical adenopathy.  Skin: No rash noted. No erythema.  Psychiatric: She has a normal mood and affect. Her behavior is normal.    BP 126/72 (BP Location: Left Arm, Patient Position: Sitting, Cuff Size: Normal)   Pulse 65   Temp 98.6 F (37 C) (Oral)   Resp 16   Ht 5' 5" (1.651 m)   Wt 157 lb 9.6 oz (71.5 kg)   SpO2 98%   BMI 26.23 kg/m  Wt Readings from Last 3 Encounters:  02/08/17 157 lb 9.6 oz (71.5 kg)  08/11/16 167 lb 12.8 oz (76.1 kg)  12/10/15 165 lb 4 oz (75 kg)     Lab Results  Component Value Date   WBC 4.8 02/08/2017   HGB 13.0 02/08/2017   HCT 38.5 02/08/2017   PLT 205.0 02/08/2017   GLUCOSE 131 (H) 02/08/2017   CHOL 159 02/08/2017   TRIG 51.0 02/08/2017   HDL 60.30 02/08/2017   LDLCALC 88 02/08/2017   ALT 14 02/08/2017   AST 14 02/08/2017   NA 143 02/08/2017   K 3.9 02/08/2017   CL 107 02/08/2017   CREATININE 0.80 02/08/2017   BUN 16 02/08/2017   CO2 31 02/08/2017   TSH 1.06 10/11/2016   HGBA1C 6.5 02/08/2017   MICROALBUR <0.7 02/08/2017       Assessment & Plan:   Problem List Items Addressed This Visit    Anemia    Ferrous sulfate constipates her.  Wants to go back on prescription iron.  Tolerated better.  Check cbc and ferritin.        Relevant Medications   Fe Fum-FePoly-Vit C-Vit B3 (INTEGRA) 62.5-62.5-40-3 MG CAPS   Other Relevant Orders   CBC with Differential/Platelet (Completed)   Ferritin (Completed)   Breast cancer (HCC)    Continues to f/u at [facility].  Mammogram 10/20/16 - Birads II.        Carotid  bruit    Carotid ultrasound 12/18/14 as outlined in overview.  Recommended f/u carotid ultrasound in two years.  Scheduled for 02/14/17.        Change in bowel function    Bowel changes as outlined.  Intermittent diarrhea.  Increased gas.  Increased  acid reflux.  On nexium.  Refer to GI for further w/up and evaluation.        Relevant Orders   Ambulatory referral to Gastroenterology   Diabetes mellitus ([place])    Low carb diet and exercise.  She has adjusted her diet.  Lost weight.  Follow met b and a1c.        Relevant Orders   Hemoglobin A1c (Completed)   Microalbumin / creatinine urine ratio (Completed)   Hypercholesterolemia    On crestor.  Low cholesterol diet and exercise.  Follow lipid panel and liver function tests.        Relevant Orders   Hepatic function panel (Completed)   Lipid panel (Completed)   Hypertension - Primary    Blood pressure under good control.  Continue same medication regimen.  Follow pressures.  Follow metabolic panel.        Relevant Orders   Basic metabolic panel (Completed)       Einar Pheasant, MD

## 2017-02-08 NOTE — Patient Instructions (Signed)
Examples of probiotic - align, phillips colon health or florastor.

## 2017-02-08 NOTE — Assessment & Plan Note (Signed)
Carotid ultrasound 12/18/14 as outlined in overview.  Recommended f/u carotid ultrasound in two years.  Scheduled for 02/14/17.

## 2017-02-10 ENCOUNTER — Encounter: Payer: Self-pay | Admitting: Internal Medicine

## 2017-02-10 NOTE — Assessment & Plan Note (Signed)
Bowel changes as outlined.  Intermittent diarrhea.  Increased gas.  Increased acid reflux.  On nexium.  Refer to GI for further w/up and evaluation.

## 2017-02-10 NOTE — Assessment & Plan Note (Signed)
Ferrous sulfate constipates her.  Wants to go back on prescription iron.  Tolerated better.  Check cbc and ferritin.

## 2017-02-10 NOTE — Assessment & Plan Note (Signed)
On crestor.  Low cholesterol diet and exercise.  Follow lipid panel and liver function tests.   

## 2017-02-10 NOTE — Assessment & Plan Note (Signed)
Low carb diet and exercise.  She has adjusted her diet.  Lost weight.  Follow met b and a1c.

## 2017-02-10 NOTE — Assessment & Plan Note (Signed)
Blood pressure under good control.  Continue same medication regimen.  Follow pressures.  Follow metabolic panel.   

## 2017-02-10 NOTE — Assessment & Plan Note (Signed)
Continues to f/u at [facility].  Mammogram 10/20/16 - Birads II.

## 2017-02-14 ENCOUNTER — Ambulatory Visit: Payer: Managed Care, Other (non HMO)

## 2017-02-14 DIAGNOSIS — I6523 Occlusion and stenosis of bilateral carotid arteries: Secondary | ICD-10-CM

## 2017-03-27 DIAGNOSIS — R194 Change in bowel habit: Secondary | ICD-10-CM | POA: Diagnosis not present

## 2017-03-27 DIAGNOSIS — D509 Iron deficiency anemia, unspecified: Secondary | ICD-10-CM | POA: Diagnosis not present

## 2017-03-27 DIAGNOSIS — Z800 Family history of malignant neoplasm of digestive organs: Secondary | ICD-10-CM | POA: Diagnosis not present

## 2017-04-08 ENCOUNTER — Emergency Department: Payer: Medicare HMO

## 2017-04-08 ENCOUNTER — Observation Stay
Admission: EM | Admit: 2017-04-08 | Discharge: 2017-04-09 | Disposition: A | Discharge status: Home / Self Care | Payer: Medicare HMO | Attending: Internal Medicine | Admitting: Internal Medicine

## 2017-04-08 ENCOUNTER — Observation Stay
Admit: 2017-04-08 | Discharge: 2017-04-08 | Disposition: A | Discharge status: Home / Self Care | Payer: Medicare HMO | Attending: Internal Medicine | Admitting: Internal Medicine

## 2017-04-08 ENCOUNTER — Observation Stay: Payer: Medicare HMO

## 2017-04-08 DIAGNOSIS — R51 Headache: Secondary | ICD-10-CM | POA: Diagnosis not present

## 2017-04-08 DIAGNOSIS — Z853 Personal history of malignant neoplasm of breast: Secondary | ICD-10-CM | POA: Diagnosis not present

## 2017-04-08 DIAGNOSIS — E7800 Pure hypercholesterolemia, unspecified: Secondary | ICD-10-CM | POA: Diagnosis not present

## 2017-04-08 DIAGNOSIS — Z79899 Other long term (current) drug therapy: Secondary | ICD-10-CM | POA: Diagnosis not present

## 2017-04-08 DIAGNOSIS — Z823 Family history of stroke: Secondary | ICD-10-CM | POA: Diagnosis not present

## 2017-04-08 DIAGNOSIS — Z7982 Long term (current) use of aspirin: Secondary | ICD-10-CM | POA: Insufficient documentation

## 2017-04-08 DIAGNOSIS — E785 Hyperlipidemia, unspecified: Secondary | ICD-10-CM | POA: Insufficient documentation

## 2017-04-08 DIAGNOSIS — E119 Type 2 diabetes mellitus without complications: Secondary | ICD-10-CM | POA: Insufficient documentation

## 2017-04-08 DIAGNOSIS — Z8673 Personal history of transient ischemic attack (TIA), and cerebral infarction without residual deficits: Secondary | ICD-10-CM | POA: Diagnosis not present

## 2017-04-08 DIAGNOSIS — G459 Transient cerebral ischemic attack, unspecified: Principal | ICD-10-CM | POA: Insufficient documentation

## 2017-04-08 DIAGNOSIS — Z9851 Tubal ligation status: Secondary | ICD-10-CM | POA: Insufficient documentation

## 2017-04-08 DIAGNOSIS — I10 Essential (primary) hypertension: Secondary | ICD-10-CM | POA: Diagnosis not present

## 2017-04-08 DIAGNOSIS — Z7902 Long term (current) use of antithrombotics/antiplatelets: Secondary | ICD-10-CM | POA: Insufficient documentation

## 2017-04-08 DIAGNOSIS — I6523 Occlusion and stenosis of bilateral carotid arteries: Secondary | ICD-10-CM | POA: Diagnosis not present

## 2017-04-08 DIAGNOSIS — Z7984 Long term (current) use of oral hypoglycemic drugs: Secondary | ICD-10-CM | POA: Diagnosis not present

## 2017-04-08 LAB — COMPREHENSIVE METABOLIC PANEL
ALK PHOS: 77 U/L (ref 38–126)
ALT: 17 U/L (ref 14–54)
AST: 20 U/L (ref 15–41)
Albumin: 4.5 g/dL (ref 3.5–5.0)
Anion gap: 7 (ref 5–15)
BUN: 19 mg/dL (ref 6–20)
CALCIUM: 9.5 mg/dL (ref 8.9–10.3)
CO2: 28 mmol/L (ref 22–32)
Chloride: 104 mmol/L (ref 101–111)
Creatinine, Ser: 0.78 mg/dL (ref 0.44–1.00)
GFR calc Af Amer: >60 mL/min (ref >60)
GFR calc non Af Amer: >60 mL/min (ref >60)
GLUCOSE: 141 mg/dL — AB (ref 65–99)
Potassium: 3.8 mmol/L (ref 3.5–5.1)
SODIUM: 139 mmol/L (ref 135–145)
TOTAL PROTEIN: 7.4 g/dL (ref 6.5–8.1)
Total Bilirubin: 0.7 mg/dL (ref 0.3–1.2)

## 2017-04-08 LAB — GLUCOSE, CAPILLARY
Glucose-Capillary: 129 mg/dL — ABNORMAL HIGH (ref 65–99)
Glucose-Capillary: 161 mg/dL — ABNORMAL HIGH (ref 65–99)
Glucose-Capillary: 123 mg/dL — ABNORMAL HIGH (ref 65–99)
Glucose-Capillary: 119 mg/dL — ABNORMAL HIGH (ref 65–99)

## 2017-04-08 LAB — LIPID PANEL
CHOL/HDL RATIO: 2.7 ratio
CHOLESTEROL: 184 mg/dL (ref 0–200)
HDL: 67 mg/dL (ref >40)
LDL Cholesterol: 103 mg/dL — ABNORMAL HIGH (ref 0–99)
TRIGLYCERIDES: 71 mg/dL (ref <150)
VLDL: 14 mg/dL (ref 0–40)

## 2017-04-08 LAB — CBC
HCT: 40.9 % (ref 35.0–47.0)
Hemoglobin: 14.2 g/dL (ref 12.0–16.0)
MCH: 30.6 pg (ref 26.0–34.0)
MCHC: 34.8 g/dL (ref 32.0–36.0)
MCV: 88 fL (ref 80.0–100.0)
PLATELETS: 192 10*3/uL (ref 150–440)
RBC: 4.65 MIL/uL (ref 3.80–5.20)
RDW: 13 % (ref 11.5–14.5)
WBC: 5.2 10*3/uL (ref 3.6–11.0)

## 2017-04-08 LAB — ECHOCARDIOGRAM COMPLETE
Height: 65 in
Weight: 2496 oz

## 2017-04-08 LAB — TROPONIN I: Troponin I: <0.03 ng/mL (ref <0.03)

## 2017-04-08 LAB — DIFFERENTIAL
Basophils Absolute: 0 10*3/uL (ref 0–0.1)
Basophils Relative: 1 %
EOS PCT: 3 %
Eosinophils Absolute: 0.2 10*3/uL (ref 0–0.7)
Lymphocytes Relative: 26 %
Lymphs Abs: 1.3 10*3/uL (ref 1.0–3.6)
MONO ABS: 0.3 10*3/uL (ref 0.2–0.9)
Monocytes Relative: 6 %
Neutro Abs: 3.3 10*3/uL (ref 1.4–6.5)
Neutrophils Relative %: 64 %

## 2017-04-08 LAB — PROTIME-INR
INR: 0.95
Prothrombin Time: 12.7 seconds (ref 11.4–15.2)

## 2017-04-08 LAB — APTT: aPTT: 25 seconds (ref 24–36)

## 2017-04-08 MED ORDER — SODIUM CHLORIDE 0.9 % IV SOLN
Freq: Once | INTRAVENOUS | Status: AC
  Administered 2017-04-08: 15:00:00 via INTRAVENOUS

## 2017-04-08 MED ORDER — ENOXAPARIN SODIUM 40 MG/0.4ML ~~LOC~~ SOLN
40.0000 mg | SUBCUTANEOUS | Status: DC
  Filled 2017-04-08: qty 0.4

## 2017-04-08 MED ORDER — TRAMADOL HCL 50 MG PO TABS
50.0000 mg | ORAL_TABLET | Freq: Four times a day (QID) | ORAL | Status: DC | PRN
  Administered 2017-04-08: 11:00:00 50 mg via ORAL
  Filled 2017-04-08: qty 1

## 2017-04-08 MED ORDER — ASPIRIN 81 MG PO CHEW: 81.0000 mg | CHEWABLE_TABLET | Freq: Every day | ORAL | Status: DC

## 2017-04-08 MED ORDER — OMEGA-3-ACID ETHYL ESTERS 1 G PO CAPS
2.0000 g | ORAL_CAPSULE | Freq: Every day | ORAL | Status: DC
  Administered 2017-04-08: 15:00:00 2 g via ORAL
  Filled 2017-04-08: qty 2

## 2017-04-08 MED ORDER — ROSUVASTATIN CALCIUM 10 MG PO TABS
10.0000 mg | ORAL_TABLET | Freq: Every day | ORAL | Status: DC
  Administered 2017-04-08: 21:00:00 10 mg via ORAL
  Filled 2017-04-08: qty 1

## 2017-04-08 MED ORDER — ACETAMINOPHEN 325 MG PO TABS
650.0000 mg | ORAL_TABLET | Freq: Once | ORAL | Status: AC
  Administered 2017-04-08: 650 mg via ORAL
  Filled 2017-04-08: qty 2

## 2017-04-08 MED ORDER — METFORMIN HCL 500 MG PO TABS
500.0000 mg | ORAL_TABLET | Freq: Every day | ORAL | Status: DC
  Administered 2017-04-08: 500 mg via ORAL
  Filled 2017-04-08: qty 1

## 2017-04-08 MED ORDER — CLOPIDOGREL BISULFATE 75 MG PO TABS
75.0000 mg | ORAL_TABLET | Freq: Every day | ORAL | Status: DC
  Administered 2017-04-08: 75 mg via ORAL
  Filled 2017-04-08: qty 1

## 2017-04-08 MED ORDER — PANTOPRAZOLE SODIUM 40 MG PO TBEC: 40.0000 mg | DELAYED_RELEASE_TABLET | Freq: Every day | ORAL | Status: DC

## 2017-04-08 MED ORDER — ACETAMINOPHEN 650 MG RE SUPP: 650.0000 mg | RECTAL | Status: DC | PRN

## 2017-04-08 MED ORDER — INSULIN ASPART 100 UNIT/ML ~~LOC~~ SOLN
0.0000 [IU] | Freq: Three times a day (TID) | SUBCUTANEOUS | Status: DC
  Filled 2017-04-08: qty 1

## 2017-04-08 MED ORDER — INTEGRA 62.5-62.5-40-3 MG PO CAPS: 1.0000 | ORAL_CAPSULE | Freq: Every day | ORAL | Status: DC

## 2017-04-08 MED ORDER — ACETAMINOPHEN 325 MG PO TABS
650.0000 mg | ORAL_TABLET | ORAL | Status: DC | PRN
  Administered 2017-04-09: 08:00:00 650 mg via ORAL
  Filled 2017-04-08: qty 2

## 2017-04-08 MED ORDER — STROKE: EARLY STAGES OF RECOVERY BOOK
Freq: Once | Status: AC
  Administered 2017-04-08: 11:00:00

## 2017-04-08 MED ORDER — ACETAMINOPHEN 160 MG/5ML PO SOLN: 650.0000 mg | ORAL | Status: DC | PRN

## 2017-04-08 MED ORDER — INSULIN ASPART 100 UNIT/ML ~~LOC~~ SOLN: 0.0000 [IU] | Freq: Every day | SUBCUTANEOUS | Status: DC

## 2017-04-08 NOTE — H&P (Addendum)
[facility] NAME: Angel Floyd    MR#:  [id number]  DATE OF BIRTH:  [birth year]  DATE OF ADMISSION:  04/08/2017  PRIMARY CARE PHYSICIAN: Einar Pheasant, MD   REQUESTING/REFERRING PHYSICIAN: Dr Owens Shark  CHIEF COMPLAINT:   difficulty with speech and right hand  numbness HISTORY OF PRESENT ILLNESS:  Angel Floyd  is a 66 y.o. female with a known history of TIA in the past, diabetes, hypertension comes to the emergency room after she woke up with a headache more on the left side and had some difficulty with words.and numbness of right 4 th and 5 th digit. Patient states her symptoms improved within 5 minutes. She took 2 aspirins this morning. Denies any symptoms at present other than a mild  Left sided headache. Denies any focal weakness. She followed her speech eval by RN  PAST MEDICAL HISTORY:   Past Medical History:  Diagnosis Date  . Anemia   . Breast cancer ([place])   . Diabetes mellitus ([place])   . History of abnormal Pap smear    class III, required cryosurgery  . Hypercholesterolemia   . Hypertension   . TIA (transient ischemic attack)     PAST SURGICAL HISTOIRY:   Past Surgical History:  Procedure Laterality Date  . BREAST CYST ASPIRATION  1989  . DILATION AND CURETTAGE OF UTERUS    . FOOT SURGERY     morton's neuroma removed - right foot  . FOOT SURGERY  10/2009   achilles tendon release with reconstructive surgery  . GYNECOLOGIC CRYOSURGERY     class III pap  . HAND SURGERY     cyst removed - left hand  . hysteroscopy and D&C  7/09  . KNEE ARTHROSCOPY Left [id number]   removed tear & resurfaced area  . KNEE SURGERY  09/20/12   torn meniscus, (Dr Leanor Kail)  . TUBAL LIGATION      SOCIAL HISTORY:   Social History  Substance Use Topics  . Smoking status: Never Smoker  . Smokeless tobacco: Never Used  . Alcohol use No    FAMILY HISTORY:   Family History  Problem Relation Age of Onset  . Lung  cancer Father   . Alcohol abuse Father   . Cancer Father        lung  . CVA Mother   . Colon cancer Mother   . Alcohol abuse Mother   . Cancer Mother        colon  . Stroke Mother   . Colon cancer Sister   . Diabetes Maternal Grandfather     DRUG ALLERGIES:   Allergies  Allergen Reactions  . Aggrenox [Aspirin-Dipyridamole Er]     REVIEW OF SYSTEMS:  Review of Systems  Constitutional: Negative for chills, fever and weight loss.  HENT: Negative for ear discharge, ear pain and nosebleeds.   Eyes: Negative for blurred vision, pain and discharge.  Respiratory: Negative for sputum production, shortness of breath, wheezing and stridor.   Cardiovascular: Negative for chest pain, palpitations, orthopnea and PND.  Gastrointestinal: Negative for abdominal pain, diarrhea, nausea and vomiting.  Genitourinary: Negative for frequency and urgency.  Musculoskeletal: Negative for back pain and joint pain.  Neurological: Positive for speech change and weakness. Negative for sensory change and focal weakness.  Psychiatric/Behavioral: Negative for depression and hallucinations. The patient is not nervous/anxious.      MEDICATIONS AT HOME:   Prior to Admission medications   Medication Sig Start Date  End Date Taking? Authorizing Provider  aspirin 81 MG tablet Take 81 mg by mouth daily.   Yes [provider]  esomeprazole (NEXIUM) 40 MG capsule TAKE 1 CAPSULE BY MOUTH  DAILY BEFORE BREAKFAST 12/05/16  Yes Einar Pheasant, MD  Fe Fum-FePoly-Vit C-Vit B3 (INTEGRA) 62.5-62.5-40-3 MG CAPS Take 1 capsule by mouth daily. 02/08/17  Yes Einar Pheasant, MD  fish oil-omega-3 fatty acids 1000 MG capsule Take 2 g by mouth daily.    Yes [provider]  metFORMIN (GLUCOPHAGE) 500 MG tablet TAKE 1 TABLET BY MOUTH  DAILY 12/05/16  Yes Einar Pheasant, MD  rosuvastatin (CRESTOR) 10 MG tablet TAKE 1 TABLET BY MOUTH  DAILY 12/05/16  Yes Einar Pheasant, MD  telmisartan (MICARDIS) 20 MG tablet TAKE  1 TABLET BY MOUTH  DAILY 12/05/16  Yes Einar Pheasant, MD  glucose blood test strip Use as instructed to check blood sugars three times daily. Has One Touch Ultra glucometer. Dx 250.00 01/08/15   Einar Pheasant, MD      VITAL SIGNS:  Blood pressure (!) 158/66, pulse 65, temperature 97.9 F (36.6 C), temperature source Oral, resp. rate 17, height 5\' 5"  (1.651 m), weight 70.8 kg (156 lb), SpO2 100 %.  PHYSICAL EXAMINATION:  GENERAL:  66 y.o.-year-old patient lying in the bed with no acute distress.  EYES: Pupils equal, round, reactive to light and accommodation. No scleral icterus. Extraocular muscles intact.  HEENT: Head atraumatic, normocephalic. Oropharynx and nasopharynx clear.  NECK:  Supple, no jugular venous distention. No thyroid enlargement, no tenderness.  LUNGS: Normal breath sounds bilaterally, no wheezing, rales,rhonchi or crepitation. No use of accessory muscles of respiration.  CARDIOVASCULAR: S1, S2 normal. No murmurs, rubs, or gallops.  ABDOMEN: Soft, nontender, nondistended. Bowel sounds present. No organomegaly or mass.  EXTREMITIES: No pedal edema, cyanosis, or clubbing.  NEUROLOGIC: Cranial nerves II through XII are intact. Muscle strength 5/5 in all extremities. Sensation intact. Gait not checked.  PSYCHIATRIC: The patient is alert and oriented x 3.  SKIN: No obvious rash, lesion, or ulcer.   LABORATORY PANEL:   CBC  Recent Labs Lab 04/08/17 0842  WBC 5.2  HGB 14.2  HCT 40.9  PLT 192   ------------------------------------------------------------------------------------------------------------------  Chemistries   Recent Labs Lab 04/08/17 0842  NA 139  K 3.8  CL 104  CO2 28  GLUCOSE 141*  BUN 19  CREATININE 0.78  CALCIUM 9.5  AST 20  ALT 17  ALKPHOS 77  BILITOT 0.7   ------------------------------------------------------------------------------------------------------------------  Cardiac Enzymes  Recent Labs Lab 04/08/17 0842  TROPONINI  <0.03   ------------------------------------------------------------------------------------------------------------------  RADIOLOGY:  Ct Head Code Stroke W/o Cm  Result Date: 04/08/2017 CLINICAL DATA:  Code stroke.  Headache, dizziness, history of TIA EXAM: CT HEAD WITHOUT CONTRAST TECHNIQUE: Contiguous axial images were obtained from the base of the skull through the vertex without intravenous contrast. COMPARISON:  None. FINDINGS: Brain: No evidence of acute infarction, hemorrhage, hydrocephalus, extra-axial collection or mass lesion/mass effect. Vascular: No hyperdense vessel or unexpected calcification. Skull: Normal. Negative for fracture or focal lesion. Sinuses/Orbits: No acute finding. Other: None. ASPECTS [facility] Stroke Program Early CT Score) - Ganglionic level infarction (caudate, lentiform nuclei, internal capsule, insula, M1-M3 cortex): 7 - Supraganglionic infarction (M4-M6 cortex): 3 Total score (0-10 with 10 being normal): 10 IMPRESSION: 1. Negative for bleed or other acute intracranial process. Critical Value/emergent results were called by telephone at the time of interpretation on 04/08/2017 at 9:13 am to Dr. Charlotte Crumb , who verbally acknowledged these results. 2.  ASPECTS is 10 Electronically Signed   By: Lucrezia Europe M.D.   On: 04/08/2017 09:13    EKG:    IMPRESSION AND PLAN:   Angel Floyd  is a 66 y.o. female with a known history of TIA in the past, diabetes, hypertension comes to the emergency room after she woke up with a headache more on the  Left side and had some difficulty with words. Patient states her symptoms improved within 5 minutes. She took 2 aspirins this morning. Denies any symptoms at present other than a mild left -sided headache.  1. TIA rule out CVA -Continue aspirin -Neurology consultation -MRI, MRA, ultrasound carotids, echo ordered  2. Diabetes sliding scale and oral meds  3. Hyperlipidemia continue statins Check lipid profile  4.  Hypertension Allow permissive hypertension  5. DVT prophylaxis Lovenox  Above was discussed with patient. Discussed with neurology   All the records are reviewed and case discussed with ED provider. Management plans discussed with the patient, family and they are in agreement.  CODE STATUS: full  TOTAL TIME TAKING CARE OF THIS PATIENT:45 minutes.    Angel Floyd M.D on 04/08/2017 at 11:16 AM  Between 7am to 6pm - Pager - [phone]  After 6pm go to [email or web address] - password [email or web address] [facility]  [facility]  Office  [phone]  CC: Primary care physician; Einar Pheasant, MD

## 2017-04-08 NOTE — Consult Note (Signed)
Referring Physician: Posey Pronto    Chief Complaint: Headache, difficulty with speech  HPI: Angel Floyd is an 66 y.o. female who reports that she awakened this morning with a severe headache that she describes as right sided, sharp and at a 10/10 severity.  Patient tried to call her husband and noted some difficulty with speech.  Also noted some numbness in the last two fingers on her right hand.  Most symptoms have resolved with only a 2-3/10 headache remaining.  Initial NIHSS of 0.    Date last known well: Date: 04/07/2017 Time last known well: Time: 22:00 tPA Given: No: Resolving symptoms  Past Medical History:  Diagnosis Date  . Anemia   . Breast cancer ([place])   . Diabetes mellitus ([place])   . History of abnormal Pap smear    class III, required cryosurgery  . Hypercholesterolemia   . Hypertension   . TIA (transient ischemic attack)     Past Surgical History:  Procedure Laterality Date  . BREAST CYST ASPIRATION  1989  . DILATION AND CURETTAGE OF UTERUS    . FOOT SURGERY     morton's neuroma removed - right foot  . FOOT SURGERY  10/2009   achilles tendon release with reconstructive surgery  . GYNECOLOGIC CRYOSURGERY     class III pap  . HAND SURGERY     cyst removed - left hand  . hysteroscopy and D&C  7/09  . KNEE ARTHROSCOPY Left [id number]   removed tear & resurfaced area  . KNEE SURGERY  09/20/12   torn meniscus, (Dr Leanor Kail)  . TUBAL LIGATION      Family History  Problem Relation Age of Onset  . Lung cancer Father   . Alcohol abuse Father   . Cancer Father        lung  . CVA Mother   . Colon cancer Mother   . Alcohol abuse Mother   . Cancer Mother        colon  . Stroke Mother   . Colon cancer Sister   . Diabetes Maternal Grandfather    Social History:  reports that she has never smoked. She has never used smokeless tobacco. She reports that she does not drink alcohol or use drugs.  Allergies:  Allergies  Allergen Reactions  . Aggrenox  [Aspirin-Dipyridamole Er]     Medications:  I have reviewed the patient's current medications. Prior to Admission:  Prescriptions Prior to Admission  Medication Sig Dispense Refill Last Dose  . aspirin 81 MG tablet Take 81 mg by mouth daily.   04/08/2017 at am  . esomeprazole (NEXIUM) 40 MG capsule TAKE 1 CAPSULE BY MOUTH  DAILY BEFORE BREAKFAST 90 capsule 1 04/08/2017 at am  . Fe Fum-FePoly-Vit C-Vit B3 (INTEGRA) 62.5-62.5-40-3 MG CAPS Take 1 capsule by mouth daily. 90 capsule 0 04/07/2017 at am  . fish oil-omega-3 fatty acids 1000 MG capsule Take 2 g by mouth daily.    04/07/2017 at am  . metFORMIN (GLUCOPHAGE) 500 MG tablet TAKE 1 TABLET BY MOUTH  DAILY 90 tablet 1 04/07/2017 at pm  . rosuvastatin (CRESTOR) 10 MG tablet TAKE 1 TABLET BY MOUTH  DAILY 90 tablet 1 04/07/2017 at qhs  . telmisartan (MICARDIS) 20 MG tablet TAKE 1 TABLET BY MOUTH  DAILY 90 tablet 1 04/08/2017 at am  . glucose blood test strip Use as instructed to check blood sugars three times daily. Has One Touch Ultra glucometer. Dx 250.00 100 each 5 Taking   Scheduled: Marland Kitchen [  START ON 04/09/2017] aspirin  81 mg Oral Daily  . enoxaparin (LOVENOX) injection  40 mg Subcutaneous Q24H  . insulin aspart  0-5 Units Subcutaneous QHS  . insulin aspart  0-9 Units Subcutaneous TID WC  . metFORMIN  500 mg Oral Q supper  . omega-3 acid ethyl esters  2 g Oral Daily  . [START ON 04/09/2017] pantoprazole  40 mg Oral Daily  . rosuvastatin  10 mg Oral QHS    ROS: History obtained from the patient  General ROS: negative for - chills, fatigue, fever, night sweats, weight gain or weight loss Psychological ROS: negative for - behavioral disorder, hallucinations, memory difficulties, mood swings or suicidal ideation Ophthalmic ROS: negative for - blurry vision, double vision, eye pain or loss of vision ENT ROS: negative for - epistaxis, nasal discharge, oral lesions, sore throat, tinnitus or vertigo Allergy and Immunology ROS: negative for - hives or  itchy/watery eyes Hematological and Lymphatic ROS: negative for - bleeding problems, bruising or swollen lymph nodes Endocrine ROS: negative for - galactorrhea, hair pattern changes, polydipsia/polyuria or temperature intolerance Respiratory ROS: negative for - cough, hemoptysis, shortness of breath or wheezing Cardiovascular ROS: negative for - chest pain, dyspnea on exertion, edema or irregular heartbeat Gastrointestinal ROS: negative for - abdominal pain, diarrhea, hematemesis, nausea/vomiting or stool incontinence Genito-Urinary ROS: negative for - dysuria, hematuria, incontinence or urinary frequency/urgency Musculoskeletal ROS: negative for - joint swelling or muscular weakness Neurological ROS: as noted in HPI Dermatological ROS: negative for rash and skin lesion changes  Physical Examination: Blood pressure (!) 158/66, pulse 65, temperature 97.9 F (36.6 C), temperature source Oral, resp. rate 17, height '5\' 5"'  (1.651 m), weight 70.8 kg (156 lb), SpO2 100 %.  HEENT-  Normocephalic, no lesions, without obvious abnormality.  Normal external eye and conjunctiva.  Normal TM's bilaterally.  Normal auditory canals and external ears. Normal external nose, mucus membranes and septum.  Normal pharynx. Cardiovascular- S1, S2 normal, pulses palpable throughout   Lungs- chest clear, no wheezing, rales, normal symmetric air entry Abdomen- soft, non-tender; bowel sounds normal; no masses,  no organomegaly Extremities- no edema Lymph-no adenopathy palpable Musculoskeletal-no joint tenderness, deformity or swelling Skin-warm and dry, no hyperpigmentation, vitiligo, or suspicious lesions  Neurological Examination   Mental Status: Alert, oriented, thought content appropriate.  Speech fluent without evidence of aphasia.  Able to follow 3 step commands without difficulty. Cranial Nerves: II: Discs flat bilaterally; Visual fields grossly normal, pupils equal, round, reactive to light and  accommodation III,IV, VI: ptosis not present, extra-ocular motions intact bilaterally V,VII: smile symmetric, facial light touch sensation normal bilaterally VIII: hearing normal bilaterally IX,X: gag reflex present XI: bilateral shoulder shrug XII: midline tongue extension Motor: Right : Upper extremity   5/5    Left:     Upper extremity   5/5  Lower extremity   5/5     Lower extremity   5/5 Tone and bulk:normal tone throughout; no atrophy noted Sensory: Pinprick and light touch intact throughout, bilaterally Deep Tendon Reflexes: 2+ and symmetric throughout Plantars: Right: equivocal   Left: equivocal  Cerebellar: Normal finger-to-nose and normal heel-to-shin testing bilaterally Gait: not tested due to safety concerns    Laboratory Studies:  Basic Metabolic Panel:  Recent Labs Lab 04/08/17 0842  NA 139  K 3.8  CL 104  CO2 28  GLUCOSE 141*  BUN 19  CREATININE 0.78  CALCIUM 9.5    Liver Function Tests:  Recent Labs Lab 04/08/17 0842  AST 20  ALT  17  ALKPHOS 77  BILITOT 0.7  PROT 7.4  ALBUMIN 4.5   No results for input(s): LIPASE, AMYLASE in the last 168 hours. No results for input(s): AMMONIA in the last 168 hours.  CBC:  Recent Labs Lab 04/08/17 0842  WBC 5.2  NEUTROABS 3.3  HGB 14.2  HCT 40.9  MCV 88.0  PLT 192    Cardiac Enzymes:  Recent Labs Lab 04/08/17 0842  TROPONINI <0.03    BNP: Invalid input(s): POCBNP  CBG:  Recent Labs Lab 04/08/17 0901  GLUCAP 129*    Microbiology: No results found for this or any previous visit.  Coagulation Studies:  Recent Labs  04/08/17 0842  LABPROT 12.7  INR 0.95    Urinalysis: No results for input(s): COLORURINE, LABSPEC, PHURINE, GLUCOSEU, HGBUR, BILIRUBINUR, KETONESUR, PROTEINUR, UROBILINOGEN, NITRITE, LEUKOCYTESUR in the last 168 hours.  Invalid input(s): APPERANCEUR  Lipid Panel:    Component Value Date/Time   CHOL 184 04/08/2017 0842   TRIG 71 04/08/2017 0842   HDL 67  04/08/2017 0842   CHOLHDL 2.7 04/08/2017 0842   VLDL 14 04/08/2017 0842   LDLCALC 103 (H) 04/08/2017 0842    HgbA1C:  Lab Results  Component Value Date   HGBA1C 6.5 02/08/2017    Urine Drug Screen:  No results found for: LABOPIA, COCAINSCRNUR, LABBENZ, AMPHETMU, THCU, LABBARB  Alcohol Level: No results for input(s): ETH in the last 168 hours.  Other results: EKG: sinus rhythm at 62 bpm.  Imaging: Mr Brain Wo Contrast  Result Date: 04/08/2017 CLINICAL DATA:  5 minute episode of RIGHT-sided headache and word-finding difficulties. History of TIA, diabetes, hypertension, breast cancer, hypercholesterolemia. EXAM: MRI HEAD WITHOUT CONTRAST MRA HEAD WITHOUT CONTRAST TECHNIQUE: Multiplanar, multiecho pulse sequences of the brain and surrounding structures were obtained without intravenous contrast. Angiographic images of the head were obtained using MRA technique without contrast. COMPARISON:  CT HEAD Apr 16, 2017 at 0851 hours and MRI/MRA head December 25, 2006 FINDINGS: MRI HEAD FINDINGS BRAIN: No reduced diffusion to suggest acute ischemia. No susceptibility artifact to suggest hemorrhage. The ventricles and sulci are normal for patient's age. A few nonspecific punctate supratentorial white matter FLAIR T2 hyperintensities, less than expected for age. No suspicious parenchymal signal, masses or mass effect. No abnormal extra-axial fluid collections. VASCULAR: Normal major intracranial vascular flow voids present at skull base. SKULL AND UPPER CERVICAL SPINE: No abnormal sellar expansion. No suspicious calvarial bone marrow signal. Craniocervical junction maintained. SINUSES/ORBITS: Lobulated LEFT maxillary sinus mucosal thickening. No paranasal sinus air-fluid levels. Mastoid air cells are well aerated. The included ocular globes and orbital contents are non-suspicious. OTHER: None. MRA HEAD FINDINGS- mild motion degraded examination. ANTERIOR CIRCULATION: Normal flow related enhancement of the  included cervical, petrous, cavernous and supraclinoid internal carotid arteries. Patent anterior communicating artery. Normal flow related enhancement of the anterior and middle cerebral arteries, including distal segments, mild proximal middle cerebral artery luminal irregularity compatible with atherosclerosis. No large vessel occlusion, high-grade stenosis, aneurysm. POSTERIOR CIRCULATION: LEFT vertebral artery is dominant. Basilar artery is patent, with normal flow related enhancement of the main branch vessels. Normal flow related enhancement of the posterior cerebral arteries, mild to moderate luminal irregularity compatible with atherosclerosis. Robust bilateral posterior communicating arteries present. No large vessel occlusion, high-grade stenosis, aneurysm. ANATOMIC VARIANTS: None. Source images and MIP images were reviewed. IMPRESSION: MRI HEAD: Negative noncontrast MRI of the head for age. MRA HEAD: No emergent large vessel occlusion or severe stenosis on this mildly motion degraded examination. Intracranial atherosclerosis resulting in  mild-to-moderate stenoses posterior cerebral artery and to lesser extent middle cerebral arteries. Electronically Signed   By: Elon Alas M.D.   On: 04/08/2017 13:44   Mr Jodene Nam Head/brain OV Cm  Result Date: 04/08/2017 CLINICAL DATA:  5 minute episode of RIGHT-sided headache and word-finding difficulties. History of TIA, diabetes, hypertension, breast cancer, hypercholesterolemia. EXAM: MRI HEAD WITHOUT CONTRAST MRA HEAD WITHOUT CONTRAST TECHNIQUE: Multiplanar, multiecho pulse sequences of the brain and surrounding structures were obtained without intravenous contrast. Angiographic images of the head were obtained using MRA technique without contrast. COMPARISON:  CT HEAD Apr 16, 2017 at 0851 hours and MRI/MRA head December 25, 2006 FINDINGS: MRI HEAD FINDINGS BRAIN: No reduced diffusion to suggest acute ischemia. No susceptibility artifact to suggest hemorrhage.  The ventricles and sulci are normal for patient's age. A few nonspecific punctate supratentorial white matter FLAIR T2 hyperintensities, less than expected for age. No suspicious parenchymal signal, masses or mass effect. No abnormal extra-axial fluid collections. VASCULAR: Normal major intracranial vascular flow voids present at skull base. SKULL AND UPPER CERVICAL SPINE: No abnormal sellar expansion. No suspicious calvarial bone marrow signal. Craniocervical junction maintained. SINUSES/ORBITS: Lobulated LEFT maxillary sinus mucosal thickening. No paranasal sinus air-fluid levels. Mastoid air cells are well aerated. The included ocular globes and orbital contents are non-suspicious. OTHER: None. MRA HEAD FINDINGS- mild motion degraded examination. ANTERIOR CIRCULATION: Normal flow related enhancement of the included cervical, petrous, cavernous and supraclinoid internal carotid arteries. Patent anterior communicating artery. Normal flow related enhancement of the anterior and middle cerebral arteries, including distal segments, mild proximal middle cerebral artery luminal irregularity compatible with atherosclerosis. No large vessel occlusion, high-grade stenosis, aneurysm. POSTERIOR CIRCULATION: LEFT vertebral artery is dominant. Basilar artery is patent, with normal flow related enhancement of the main branch vessels. Normal flow related enhancement of the posterior cerebral arteries, mild to moderate luminal irregularity compatible with atherosclerosis. Robust bilateral posterior communicating arteries present. No large vessel occlusion, high-grade stenosis, aneurysm. ANATOMIC VARIANTS: None. Source images and MIP images were reviewed. IMPRESSION: MRI HEAD: Negative noncontrast MRI of the head for age. MRA HEAD: No emergent large vessel occlusion or severe stenosis on this mildly motion degraded examination. Intracranial atherosclerosis resulting in mild-to-moderate stenoses posterior cerebral artery and to  lesser extent middle cerebral arteries. Electronically Signed   By: Elon Alas M.D.   On: 04/08/2017 13:44   Ct Head Code Stroke W/o Cm  Result Date: 04/08/2017 CLINICAL DATA:  Code stroke.  Headache, dizziness, history of TIA EXAM: CT HEAD WITHOUT CONTRAST TECHNIQUE: Contiguous axial images were obtained from the base of the skull through the vertex without intravenous contrast. COMPARISON:  None. FINDINGS: Brain: No evidence of acute infarction, hemorrhage, hydrocephalus, extra-axial collection or mass lesion/mass effect. Vascular: No hyperdense vessel or unexpected calcification. Skull: Normal. Negative for fracture or focal lesion. Sinuses/Orbits: No acute finding. Other: None. ASPECTS [facility] Stroke Program Early CT Score) - Ganglionic level infarction (caudate, lentiform nuclei, internal capsule, insula, M1-M3 cortex): 7 - Supraganglionic infarction (M4-M6 cortex): 3 Total score (0-10 with 10 being normal): 10 IMPRESSION: 1. Negative for bleed or other acute intracranial process. Critical Value/emergent results were called by telephone at the time of interpretation on 04/08/2017 at 9:13 am to Dr. Charlotte Crumb , who verbally acknowledged these results. 2. ASPECTS is 10 Electronically Signed   By: Lucrezia Europe M.D.   On: 04/08/2017 09:13    Assessment: 66 y.o. female presenting with headache and difficulty with speech, right finger numbness.  Symptoms resolving.  Patient with a  history of a TIA I the past presenting with right sided symptoms in 2008.  Work up was unremarkable.  Patient has been on ASA since that time.  MRI of the brain reviewed and unremarkable.  Some mild to moderate atherosclerotic disease noted on MRA.  TIA and complicated migraine are on the differential.  Further work up recomnended.  Carotid dopplers show no evidence of hemodynamically significant stenosis.  Echocardiogram pending.  A1c pending, LDL 103.  Stroke Risk Factors - diabetes mellitus, hyperlipidemia and  hypertension  Plan: 1. HgbA1c, ESR, CRP 2. PT consult, OT consult, Speech consult 3. Prophylactic therapy-ASA 36m and Plavix 750mdaily.  Patient with multiple risk factors and atherosclerotic disease. 4. NPO until RN stroke swallow screen 5. Telemetry monitoring 6. Frequent neuro checks   LeAlexis GoodellMD Neurology [id number]/20/2018, 1:55 PM

## 2017-04-08 NOTE — ED Triage Notes (Addendum)
Pt to ED via [facility] c/o headache. EMS reports pt woke up with headache at 7 am, expressive aphasia, and right hand numbness that resolved en route. Pt has hx of tia. Pt reports taking 162 mg of ASA.

## 2017-04-08 NOTE — ED Notes (Signed)
Called code stroke [phone]

## 2017-04-08 NOTE — ED Provider Notes (Addendum)
[facility] Emergency Department Provider Note  ____________________________________________   I have reviewed the triage vital signs and the nursing notes.   HISTORY  Chief Complaint Code Stroke    HPI Angel Floyd is a 66 y.o. female with a history of diabetes and TIAs in the past, hypertension and high cholesterol, anemia, states she woke up this morning with a headache that was not worst headache of life and that she felt that she had difficulty connecting her words to her brain. That is completely gone at this time, she states also she had some tingling a few fingers of her right hand. Not the entire hand. Denies any chest pain, shortness of breath nausea or vomiting. No recent trauma. She has had no fever or chills. She denies any other neurologic symptoms such as difficulty speaking or talking or focal numbness or weakness aside from that which was temporarily present in the ambulance upon her transmission here. Her symptoms gradually resolved on their own, her headache is minimal at this time. Aside from that there was no particular alleviating or aggravating factors,   Past Medical History:  Diagnosis Date  . Anemia   . Breast cancer ([place])   . Diabetes mellitus ([place])   . History of abnormal Pap smear    class III, required cryosurgery  . Hypercholesterolemia   . Hypertension   . TIA (transient ischemic attack)     Patient Active Problem List   Diagnosis Date Noted  . Chest pain 10/13/2015  . Rash and nonspecific skin eruption 06/15/2015  . Health care maintenance 04/08/2015  . Carotid bruit 12/12/2014  . Left leg pain 07/12/2014  . Change in bowel function 07/12/2014  . Hypertension 10/10/2012  . Hypercholesterolemia 10/10/2012  . Diabetes mellitus ([place]) 10/10/2012  . TIA (transient ischemic attack) 10/10/2012  . Anemia 10/10/2012  . Breast cancer ([place]) 10/10/2012    Past Surgical History:  Procedure Laterality Date  . BREAST CYST  ASPIRATION  1989  . DILATION AND CURETTAGE OF UTERUS    . FOOT SURGERY     morton's neuroma removed - right foot  . FOOT SURGERY  10/2009   achilles tendon release with reconstructive surgery  . GYNECOLOGIC CRYOSURGERY     class III pap  . HAND SURGERY     cyst removed - left hand  . hysteroscopy and D&C  7/09  . KNEE ARTHROSCOPY Left [id number]   removed tear & resurfaced area  . KNEE SURGERY  09/20/12   torn meniscus, (Dr Leanor Kail)  . TUBAL LIGATION      Prior to Admission medications   Medication Sig Start Date End Date Taking? Authorizing Provider  aspirin 81 MG tablet Take 81 mg by mouth daily.    [provider]  esomeprazole (NEXIUM) 40 MG capsule TAKE 1 CAPSULE BY MOUTH  DAILY BEFORE BREAKFAST 12/05/16   Einar Pheasant, MD  Fe Fum-FePoly-Vit C-Vit B3 (INTEGRA) 62.5-62.5-40-3 MG CAPS Take 1 capsule by mouth daily. 02/08/17   Einar Pheasant, MD  fish oil-omega-3 fatty acids 1000 MG capsule Take 2 g by mouth daily.     [provider]  glucose blood test strip Use as instructed to check blood sugars three times daily. Has One Touch Ultra glucometer. Dx 250.00 01/08/15   Einar Pheasant, MD  metFORMIN (GLUCOPHAGE) 500 MG tablet TAKE 1 TABLET BY MOUTH  DAILY 12/05/16   Einar Pheasant, MD  rosuvastatin (CRESTOR) 10 MG tablet TAKE 1 TABLET BY MOUTH  DAILY 12/05/16  Einar Pheasant, MD  telmisartan (MICARDIS) 20 MG tablet TAKE 1 TABLET BY MOUTH  DAILY 12/05/16   Einar Pheasant, MD  traZODone (DESYREL) 50 MG tablet Take 0.5-1 tablets (25-50 mg total) by mouth at bedtime as needed for sleep. 01/15/15   Einar Pheasant, MD    Allergies Aggrenox [aspirin-dipyridamole er]  Family History  Problem Relation Age of Onset  . Lung cancer Father   . Alcohol abuse Father   . Cancer Father        lung  . CVA Mother   . Colon cancer Mother   . Alcohol abuse Mother   . Cancer Mother        colon  . Stroke Mother   . Colon cancer Sister   . Diabetes Maternal  Grandfather     Social History Social History  Substance Use Topics  . Smoking status: Never Smoker  . Smokeless tobacco: Never Used  . Alcohol use No    Review of Systems Constitutional: No fever/chills Eyes: No visual changes. ENT: No sore throat. No stiff neck no neck pain Cardiovascular: Denies chest pain. Respiratory: Denies shortness of breath. Gastrointestinal:   no vomiting.  No diarrhea.  No constipation. Genitourinary: Negative for dysuria. Musculoskeletal: Negative lower extremity swelling Skin: Negative for rash. Neurological: see hpi 10-point ROS otherwise negative.  ____________________________________________   PHYSICAL EXAM:  VITAL SIGNS: ED Triage Vitals  Enc Vitals Group     BP      Pulse      Resp      Temp      Temp src      SpO2      Weight      Height      Head Circumference      Peak Flow      Pain Score      Pain Loc      Pain Edu?      Excl. in [place]?     Constitutional: Alert and oriented. Well appearing and in no acute distress.Patient is very anxious Eyes: Conjunctivae are normal. PERRL. EOMI. Head: Atraumatic. Nose: No congestion/rhinnorhea. Mouth/Throat: Mucous membranes are moist.  Oropharynx non-erythematous. Neck: No stridor.   Nontender with no meningismus Cardiovascular: Normal rate, regular rhythm. Grossly normal heart sounds.  Good peripheral circulation. Respiratory: Normal respiratory effort.  No retractions. Lungs CTAB. Abdominal: Soft and nontender. No distention. No guarding no rebound Back:  There is no focal tenderness or step off.  there is no midline tenderness there are no lesions noted. there is no CVA tenderness Musculoskeletal: No lower extremity tenderness, no upper extremity tenderness. No joint effusions, no DVT signs strong distal pulses no edema Neurologic:  Cranial nerves II through XII are grossly intact 5 out of 5 strength bilateral upper and lower extremity. Finger to nose within normal limits heel to  shin within normal limits, speech is normal with no word finding difficulty or dysarthria, reflexes symmetric, pupils are equally round and reactive to light, there is no pronator drift, sensation is normal, vision is intact to confrontation, gait is deferred, there is no nystagmus, normal neurologic exam Skin:  Skin is warm, dry and intact. No rash noted. Psychiatric: Mood and affect are anxious. Speech and behavior are normal.  ____________________________________________   LABS (all labs ordered are listed, but only abnormal results are displayed)  Labs Reviewed  PROTIME-INR  APTT  CBC  DIFFERENTIAL  COMPREHENSIVE METABOLIC PANEL  TROPONIN I  CBG MONITORING, ED   ____________________________________________  EKG  I  personally interpreted any EKGs ordered by me or triage Sinus rhythm rate 62 bpm no acute ST elevation or acute ST depression normal axis nonspecific ST changes ____________________________________________  RADIOLOGY  I reviewed any imaging ordered by me or triage that were performed during my shift and, if possible, patient and/or family made aware of any abnormal findings. ____________________________________________   PROCEDURES  Procedure(s) performed: None  Procedures  Critical Care performed: None  ____________________________________________   INITIAL IMPRESSION / ASSESSMENT AND PLAN / ED COURSE  Pertinent labs & imaging results that were available during my care of the patient were reviewed by me and considered in my medical decision making (see chart for details).  Patient here with transient word finding difficulty. She states at this time she is speaking normally except for "every once in a while" she has trouble finding words. This is not discernible to me. She seems fluent in her conversation. She has no evidence of receptive or expressive aphasia, she has otherwise negative neurologic exam with a NIH stroke scale of 0. Nonetheless given her  history we are obtaining a CT scan. She did have some headache associated with this, the headache was there when she woke up this morning. No evidence of distress in that regard at this time. Given recent data suggesting the efficacy of CT for head bleed, I don't think she likely will require lumbar puncture. Patient was called as a code stroke given acuity of symptoms, we will defer to neurology what other intervention they would like to perform. Patient provided reassurance, blood glucose is 140s per EMS but we will recheck  ----------------------------------------- 9:10 AM on 04/08/2017 -----------------------------------------  Seen and evaluated by neurology, they do not feel the patient requires TPA but do require admission. We'll give her Tylenol for her slight headache, at this time however there is no further acute intervention needed at this moment.       ____________________________________________   FINAL CLINICAL IMPRESSION(S) / ED DIAGNOSES  Final diagnoses:  None      This chart was dictated using voice recognition software.  Despite best efforts to proofread,  errors can occur which can change meaning.      Schuyler Amor, MD 04/08/17 [id number]    Schuyler Amor, MD 04/08/17 [id number]    Schuyler Amor, MD 04/08/17 [phone]

## 2017-04-08 NOTE — Progress Notes (Signed)
*  PRELIMINARY RESULTS* Echocardiogram 2D Echocardiogram has been performed.  Angel Floyd Haze 04/08/2017, 2:50 PM

## 2017-04-08 NOTE — Progress Notes (Signed)
Physical Therapy Evaluation Patient Details Name: Angel Floyd MRN: [id number] DOB: [birth year] Today's Date: 04/08/2017   History of Present Illness  Angel Floyd  is a 66 y.o. female with a known history of TIA in the past, diabetes, hypertension comes to the emergency room after she woke up with a headache more on the right side and had some difficulty with words. Patient states her symptoms improved within 5 minutes. She took 2 aspirins this morning. Denies any symptoms at present other than a mild right-sided headache.Angel Floyd  is a 66 y.o. female with a known history of TIA in the past, diabetes, hypertension comes to the emergency room after she woke up with a headache more on the right side and had some difficulty with words. Patient states her symptoms improved within 5 minutes. She took 2 aspirins this morning. Denies any symptoms at present other than a mild right-sided headache.  Clinical Impression  Patient presents with no new symptoms and her head ache is gone in the last 1/2 hour. She has no strength deficits, no standing balance deficits or sitting balance deficits and is independent with ambulation without device and with normal gait speed. Patient does not have any skilled PT needs at this time and does not need and home health PT at this time.     Follow Up Recommendations No PT follow up    Equipment Recommendations       Recommendations for Other Services       Precautions / Restrictions Precautions Precautions: None Restrictions Weight Bearing Restrictions: No      Mobility  Bed Mobility Overal bed mobility: Independent                Transfers Overall transfer level: Independent Equipment used: None                Ambulation/Gait Ambulation/Gait assistance: Independent Ambulation Distance (Feet): 300 Feet Assistive device: None Gait Pattern/deviations: Step-through pattern   Gait velocity interpretation: >2.62 ft/sec, indicative of  independent community ambulator General Gait Details:  (steady no deviations)  Stairs            Wheelchair Mobility    Modified Rankin (Stroke Patients Only)       Balance Overall balance assessment: Independent                                           Pertinent Vitals/Pain Pain Assessment: No/denies pain    Home Living Family/patient expects to be discharged to:: Private residence Living Arrangements: Spouse/significant other Available Help at Discharge: Family Type of Home: House Home Access: Stairs to enter   Technical brewer of Steps: 2 Home Layout: One level Home Equipment: None      Prior Function Level of Independence: Independent               Hand Dominance        Extremity/Trunk Assessment   Upper Extremity Assessment Upper Extremity Assessment: Overall WFL for tasks assessed    Lower Extremity Assessment Lower Extremity Assessment: Overall WFL for tasks assessed    Cervical / Trunk Assessment Cervical / Trunk Assessment: Normal  Communication   Communication: No difficulties  Cognition Arousal/Alertness: Awake/alert Behavior During Therapy: WFL for tasks assessed/performed Overall Cognitive Status: Within Functional Limits for tasks assessed  General Comments      Exercises     Assessment/Plan    PT Assessment Patent does not need any further PT services  PT Problem List         PT Treatment Interventions      PT Goals (Current goals can be found in the Care Plan section)  Acute Rehab PT Goals Patient Stated Goal: to go home PT Goal Formulation: With patient Time For Goal Achievement: 04/22/17 Potential to Achieve Goals: Good    Frequency     Barriers to discharge        Co-evaluation               AM-PAC PT "6 Clicks" Daily Activity  Outcome Measure Difficulty turning over in bed (including adjusting bedclothes, sheets  and blankets)?: None Difficulty moving from lying on back to sitting on the side of the bed? : None Difficulty sitting down on and standing up from a chair with arms (e.g., wheelchair, bedside commode, etc,.)?: None Help needed moving to and from a bed to chair (including a wheelchair)?: None Help needed walking in hospital room?: None Help needed climbing 3-5 steps with a railing? : None 6 Click Score: 24    End of Session Equipment Utilized During Treatment: Gait belt Activity Tolerance: Patient tolerated treatment well Patient left: in bed   PT Visit Diagnosis: Muscle weakness (generalized) (M62.81)    Time: 1515-1530 PT Time Calculation (min) (ACUTE ONLY): 15 min   Charges:   PT Evaluation $PT Eval Low Complexity: 1 Procedure     PT G Codes:   PT G-Codes **NOT FOR INPATIENT CLASS** Functional Assessment Tool Used: AM-PAC 6 Clicks Basic Mobility Functional Limitation: Mobility: Walking and moving around Mobility: Walking and Moving Around Current Status (L8590): 0 percent impaired, limited or restricted Mobility: Walking and Moving Around Goal Status (B3112): 0 percent impaired, limited or restricted Mobility: Walking and Moving Around Discharge Status (T6244): 0 percent impaired, limited or restricted    Alanson Puls, PT, DPT  [place], Minette Headland S 04/08/2017, 3:33 PM

## 2017-04-09 ENCOUNTER — Telehealth: Payer: Self-pay | Admitting: Internal Medicine

## 2017-04-09 LAB — SEDIMENTATION RATE: Sed Rate: 7 mm/hr (ref 0–30)

## 2017-04-09 LAB — GLUCOSE, CAPILLARY: Glucose-Capillary: 123 mg/dL — ABNORMAL HIGH (ref 65–99)

## 2017-04-09 LAB — C-REACTIVE PROTEIN: CRP: <0.8 mg/dL (ref <1.0)

## 2017-04-09 MED ORDER — CLOPIDOGREL BISULFATE 75 MG PO TABS: 75.0000 mg | ORAL_TABLET | Freq: Every day | ORAL | 1 refills | Status: DC

## 2017-04-09 NOTE — Telephone Encounter (Signed)
Ginger from [facility] called and needs to schedule a 2 week HFU for a TIA. Please advise, thank you!

## 2017-04-09 NOTE — Care Management (Signed)
Discharged to home this morning. Unable to talk to Ms. Morken prior to her discharge. Telephone call to Ms. Gallion. Discussed Medicare Outpatient Observation Notice.  Ms. Bloch gave verbal permission to sign this form Shelbie Ammons RN MSN [facility] [phone]

## 2017-04-09 NOTE — Progress Notes (Signed)
Discharge instructions along with home medications and follow up gone over with patient and husband. Both verbalize that they understood instructions. No prescriptions given to patient. IV removed. Pt being discharged home on room air, no distress noted. Avyukth Bontempo S Fenton, RN 

## 2017-04-09 NOTE — Discharge Summary (Addendum)
[facility] NAME: Angel Floyd    MR#:  [id number]  DATE OF BIRTH:  09-Oct-1951  DATE OF ADMISSION:  04/08/2017 ADMITTING PHYSICIAN: Fritzi Mandes, MD  DATE OF DISCHARGE: 04/09/17  PRIMARY CARE PHYSICIAN: Einar Pheasant, MD    ADMISSION DIAGNOSIS:  Transient cerebral ischemia, unspecified type [G45.9]  DISCHARGE DIAGNOSIS:  TIA  SECONDARY DIAGNOSIS:   Past Medical History:  Diagnosis Date  . Anemia   . Breast cancer ([place])   . Diabetes mellitus ([place])   . History of abnormal Pap smear    class III, required cryosurgery  . Hypercholesterolemia   . Hypertension   . TIA (transient ischemic attack)     HOSPITAL COURSE:  Angel Floyd  is a 66 y.o. female with a known history of TIA in the past, diabetes, hypertension comes to the emergency room after she woke up with a headache more on the left side and had some difficulty with words. Patient states her symptoms improved within 5 minutes. She took 2 aspirins this morning. Denies any symptoms at present other than a mild left-sided headache.  1. TIA ruled out CVA -Continue aspirin and added plavix by Dr Doy Mince -Neurology consultation appreciated -MRI, MRA, ultrasound carotids, echo  Reviewed. No CVA on MRI  2. Diabetes sliding scale and oral meds  3. Hyperlipidemia continue statins  4. Hypertension Allowed permissive hypertension -resume home bp meds  5. DVT prophylaxis Lovenox  Pt stale for discharge today  CONSULTS OBTAINED:  Treatment Team:  Catarina Hartshorn, MD Alexis Goodell, MD  DRUG ALLERGIES:   Allergies  Allergen Reactions  . Aggrenox [Aspirin-Dipyridamole Er]     DISCHARGE MEDICATIONS:   Current Discharge Medication List    START taking these medications   Details  clopidogrel (PLAVIX) 75 MG tablet Take 1 tablet (75 mg total) by mouth daily. Qty: 30 tablet, Refills: 1      CONTINUE these medications which have NOT CHANGED    Details  aspirin 81 MG tablet Take 81 mg by mouth daily.    esomeprazole (NEXIUM) 40 MG capsule TAKE 1 CAPSULE BY MOUTH  DAILY BEFORE BREAKFAST Qty: 90 capsule, Refills: 1    Fe Fum-FePoly-Vit C-Vit B3 (INTEGRA) 62.5-62.5-40-3 MG CAPS Take 1 capsule by mouth daily. Qty: 90 capsule, Refills: 0    fish oil-omega-3 fatty acids 1000 MG capsule Take 2 g by mouth daily.     metFORMIN (GLUCOPHAGE) 500 MG tablet TAKE 1 TABLET BY MOUTH  DAILY Qty: 90 tablet, Refills: 1    rosuvastatin (CRESTOR) 10 MG tablet TAKE 1 TABLET BY MOUTH  DAILY Qty: 90 tablet, Refills: 1    telmisartan (MICARDIS) 20 MG tablet TAKE 1 TABLET BY MOUTH  DAILY Qty: 90 tablet, Refills: 1    glucose blood test strip Use as instructed to check blood sugars three times daily. Has One Touch Ultra glucometer. Dx 250.00 Qty: 100 each, Refills: 5        If you experience worsening of your admission symptoms, develop shortness of breath, life threatening emergency, suicidal or homicidal thoughts you must seek medical attention immediately by calling 911 or calling your MD immediately  if symptoms less severe.  You Must read complete instructions/literature along with all the possible adverse reactions/side effects for all the Medicines you take and that have been prescribed to you. Take any new Medicines after you have completely understood and accept all the possible adverse reactions/side effects.   Please note  You were  cared for by a hospitalist during your hospital stay. If you have any questions about your discharge medications or the care you received while you were in the hospital after you are discharged, you can call the unit and asked to speak with the hospitalist on call if the hospitalist that took care of you is not available. Once you are discharged, your primary care physician will handle any further medical issues. Please note that NO REFILLS for any discharge medications will be authorized once you are discharged,  as it is imperative that you return to your primary care physician (or establish a relationship with a primary care physician if you do not have one) for your aftercare needs so that they can reassess your need for medications and monitor your lab values. Today   SUBJECTIVE  No further numbness episode No headache   VITAL SIGNS:  Blood pressure 133/61, pulse (!) 52, temperature 98.1 F (36.7 C), temperature source Oral, resp. rate 20, height 5\' 5"  (1.651 m), weight 70.8 kg (156 lb), SpO2 99 %.  I/O:   Intake/Output Summary (Last 24 hours) at 04/09/17 0759 Last data filed at 04/08/17 1800  Gross per 24 hour  Intake              240 ml  Output                0 ml  Net              240 ml    PHYSICAL EXAMINATION:  GENERAL:  66 y.o.-year-old patient lying in the bed with no acute distress.  EYES: Pupils equal, round, reactive to light and accommodation. No scleral icterus. Extraocular muscles intact.  HEENT: Head atraumatic, normocephalic. Oropharynx and nasopharynx clear.  NECK:  Supple, no jugular venous distention. No thyroid enlargement, no tenderness.  LUNGS: Normal breath sounds bilaterally, no wheezing, rales,rhonchi or crepitation. No use of accessory muscles of respiration.  CARDIOVASCULAR: S1, S2 normal. No murmurs, rubs, or gallops.  ABDOMEN: Soft, non-tender, non-distended. Bowel sounds present. No organomegaly or mass.  EXTREMITIES: No pedal edema, cyanosis, or clubbing.  NEUROLOGIC: Cranial nerves II through XII are intact. Muscle strength 5/5 in all extremities. Sensation intact. Gait not checked.  PSYCHIATRIC: The patient is alert and oriented x 3.  SKIN: No obvious rash, lesion, or ulcer.   DATA REVIEW:   CBC   Recent Labs Lab 04/08/17 0842  WBC 5.2  HGB 14.2  HCT 40.9  PLT 192    Chemistries   Recent Labs Lab 04/08/17 0842  NA 139  K 3.8  CL 104  CO2 28  GLUCOSE 141*  BUN 19  CREATININE 0.78  CALCIUM 9.5  AST 20  ALT 17  ALKPHOS 77   BILITOT 0.7    Microbiology Results   No results found for this or any previous visit (from the past 240 hour(s)).  RADIOLOGY:  Mr Brain Wo Contrast  Result Date: 04/08/2017 CLINICAL DATA:  5 minute episode of RIGHT-sided headache and word-finding difficulties. History of TIA, diabetes, hypertension, breast cancer, hypercholesterolemia. EXAM: MRI HEAD WITHOUT CONTRAST MRA HEAD WITHOUT CONTRAST TECHNIQUE: Multiplanar, multiecho pulse sequences of the brain and surrounding structures were obtained without intravenous contrast. Angiographic images of the head were obtained using MRA technique without contrast. COMPARISON:  CT HEAD Apr 16, 2017 at 0851 hours and MRI/MRA head December 25, 2006 FINDINGS: MRI HEAD FINDINGS BRAIN: No reduced diffusion to suggest acute ischemia. No susceptibility artifact to suggest hemorrhage. The ventricles and sulci  are normal for patient's age. A few nonspecific punctate supratentorial white matter FLAIR T2 hyperintensities, less than expected for age. No suspicious parenchymal signal, masses or mass effect. No abnormal extra-axial fluid collections. VASCULAR: Normal major intracranial vascular flow voids present at skull base. SKULL AND UPPER CERVICAL SPINE: No abnormal sellar expansion. No suspicious calvarial bone marrow signal. Craniocervical junction maintained. SINUSES/ORBITS: Lobulated LEFT maxillary sinus mucosal thickening. No paranasal sinus air-fluid levels. Mastoid air cells are well aerated. The included ocular globes and orbital contents are non-suspicious. OTHER: None. MRA HEAD FINDINGS- mild motion degraded examination. ANTERIOR CIRCULATION: Normal flow related enhancement of the included cervical, petrous, cavernous and supraclinoid internal carotid arteries. Patent anterior communicating artery. Normal flow related enhancement of the anterior and middle cerebral arteries, including distal segments, mild proximal middle cerebral artery luminal irregularity  compatible with atherosclerosis. No large vessel occlusion, high-grade stenosis, aneurysm. POSTERIOR CIRCULATION: LEFT vertebral artery is dominant. Basilar artery is patent, with normal flow related enhancement of the main branch vessels. Normal flow related enhancement of the posterior cerebral arteries, mild to moderate luminal irregularity compatible with atherosclerosis. Robust bilateral posterior communicating arteries present. No large vessel occlusion, high-grade stenosis, aneurysm. ANATOMIC VARIANTS: None. Source images and MIP images were reviewed. IMPRESSION: MRI HEAD: Negative noncontrast MRI of the head for age. MRA HEAD: No emergent large vessel occlusion or severe stenosis on this mildly motion degraded examination. Intracranial atherosclerosis resulting in mild-to-moderate stenoses posterior cerebral artery and to lesser extent middle cerebral arteries. Electronically Signed   By: Elon Alas M.D.   On: 04/08/2017 13:44   US Carotid Bilateral (at [facility] And [facility] Only)  Result Date: 04/08/2017 CLINICAL DATA:  TIA.  Hypertension, hyperlipidemia, diabetes. EXAM: BILATERAL CAROTID DUPLEX ULTRASOUND TECHNIQUE: Pearline Cables scale imaging, color Doppler and duplex ultrasound was performed of bilateral carotid and vertebral arteries in the neck. COMPARISON:  12/24/2006 TECHNIQUE: Quantification of carotid stenosis is based on velocity parameters that correlate the residual internal carotid diameter with NASCET-based stenosis levels, using the diameter of the distal internal carotid lumen as the denominator for stenosis measurement. The following velocity measurements were obtained: PEAK SYSTOLIC/END DIASTOLIC RIGHT ICA:                     109/34cm/sec CCA:                     [id number] SYSTOLIC ICA/CCA RATIO:  3.87 DIASTOLIC ICA/CCA RATIO: 5.64 ECA:                     1.2cm/sec LEFT ICA:                     110/29cm/sec CCA:                     [id number] SYSTOLIC ICA/CCA RATIO:  6.60 DIASTOLIC ICA/CCA  RATIO: 6.30 ECA:                     117cm/sec FINDINGS: RIGHT CAROTID ARTERY: Mild tortuosity. There is mild slightly irregular plaque in the distal common carotid artery. Circumferential partially calcified plaque in the carotid bulb extending into the proximal ICA. No high-grade stenosis. Normal waveforms and color Doppler signal. RIGHT VERTEBRAL ARTERY:  Normal flow direction and waveform. LEFT CAROTID ARTERY: Mild eccentric partially calcified plaque in the carotid bulb extending to the ICA resulting in at least mild stenosis. Normal waveforms and color Doppler signal however. Distal ICA mildly tortuous. LEFT  VERTEBRAL ARTERY: Normal flow direction and waveform. IMPRESSION: 1. Bilateral carotid bifurcation and proximal ICA plaque resulting in less than 50% diameter stenosis. 2.  Antegrade bilateral vertebral arterial flow. Electronically Signed   By: Lucrezia Europe M.D.   On: 04/08/2017 13:56   Mr Jodene Nam Head/brain IH Cm  Result Date: 04/08/2017 CLINICAL DATA:  5 minute episode of RIGHT-sided headache and word-finding difficulties. History of TIA, diabetes, hypertension, breast cancer, hypercholesterolemia. EXAM: MRI HEAD WITHOUT CONTRAST MRA HEAD WITHOUT CONTRAST TECHNIQUE: Multiplanar, multiecho pulse sequences of the brain and surrounding structures were obtained without intravenous contrast. Angiographic images of the head were obtained using MRA technique without contrast. COMPARISON:  CT HEAD Apr 16, 2017 at 0851 hours and MRI/MRA head December 25, 2006 FINDINGS: MRI HEAD FINDINGS BRAIN: No reduced diffusion to suggest acute ischemia. No susceptibility artifact to suggest hemorrhage. The ventricles and sulci are normal for patient's age. A few nonspecific punctate supratentorial white matter FLAIR T2 hyperintensities, less than expected for age. No suspicious parenchymal signal, masses or mass effect. No abnormal extra-axial fluid collections. VASCULAR: Normal major intracranial vascular flow voids present at  skull base. SKULL AND UPPER CERVICAL SPINE: No abnormal sellar expansion. No suspicious calvarial bone marrow signal. Craniocervical junction maintained. SINUSES/ORBITS: Lobulated LEFT maxillary sinus mucosal thickening. No paranasal sinus air-fluid levels. Mastoid air cells are well aerated. The included ocular globes and orbital contents are non-suspicious. OTHER: None. MRA HEAD FINDINGS- mild motion degraded examination. ANTERIOR CIRCULATION: Normal flow related enhancement of the included cervical, petrous, cavernous and supraclinoid internal carotid arteries. Patent anterior communicating artery. Normal flow related enhancement of the anterior and middle cerebral arteries, including distal segments, mild proximal middle cerebral artery luminal irregularity compatible with atherosclerosis. No large vessel occlusion, high-grade stenosis, aneurysm. POSTERIOR CIRCULATION: LEFT vertebral artery is dominant. Basilar artery is patent, with normal flow related enhancement of the main branch vessels. Normal flow related enhancement of the posterior cerebral arteries, mild to moderate luminal irregularity compatible with atherosclerosis. Robust bilateral posterior communicating arteries present. No large vessel occlusion, high-grade stenosis, aneurysm. ANATOMIC VARIANTS: None. Source images and MIP images were reviewed. IMPRESSION: MRI HEAD: Negative noncontrast MRI of the head for age. MRA HEAD: No emergent large vessel occlusion or severe stenosis on this mildly motion degraded examination. Intracranial atherosclerosis resulting in mild-to-moderate stenoses posterior cerebral artery and to lesser extent middle cerebral arteries. Electronically Signed   By: Elon Alas M.D.   On: 04/08/2017 13:44   Ct Head Code Stroke W/o Cm  Result Date: 04/08/2017 CLINICAL DATA:  Code stroke.  Headache, dizziness, history of TIA EXAM: CT HEAD WITHOUT CONTRAST TECHNIQUE: Contiguous axial images were obtained from the base of  the skull through the vertex without intravenous contrast. COMPARISON:  None. FINDINGS: Brain: No evidence of acute infarction, hemorrhage, hydrocephalus, extra-axial collection or mass lesion/mass effect. Vascular: No hyperdense vessel or unexpected calcification. Skull: Normal. Negative for fracture or focal lesion. Sinuses/Orbits: No acute finding. Other: None. ASPECTS [facility] Stroke Program Early CT Score) - Ganglionic level infarction (caudate, lentiform nuclei, internal capsule, insula, M1-M3 cortex): 7 - Supraganglionic infarction (M4-M6 cortex): 3 Total score (0-10 with 10 being normal): 10 IMPRESSION: 1. Negative for bleed or other acute intracranial process. Critical Value/emergent results were called by telephone at the time of interpretation on 04/08/2017 at 9:13 am to Dr. Charlotte Crumb , who verbally acknowledged these results. 2. ASPECTS is 10 Electronically Signed   By: Lucrezia Europe M.D.   On: 04/08/2017 09:13     Management plans  discussed with the patient, family and they are in agreement.  CODE STATUS:     Code Status Orders        Start     Ordered   04/08/17 1103  Full code  Continuous     04/08/17 1102    Code Status History    Date Active Date Inactive Code Status Order ID Comments User Context   This patient has a current code status but no historical code status.    Advance Directive Documentation     Most Recent Value  Type of Advance Directive  Healthcare Power of Attorney, Living will  Pre-existing out of facility DNR order (yellow form or pink MOST form)  -  "MOST" Form in Place?  -      TOTAL TIME TAKING CARE OF THIS PATIENT: *40* minutes.    Maansi Wike M.D on 04/09/2017 at 7:59 AM  Between 7am to 6pm - Pager - [phone] After 6pm go to [email or web address] - password [email or web address]  Office  [phone]  CC: Primary care physician; Einar Pheasant, MD

## 2017-04-09 NOTE — Plan of Care (Signed)
Problem: Safety: Goal: Ability to remain free from injury will improve Outcome: Progressing Pt independently up in room

## 2017-04-10 ENCOUNTER — Encounter: Payer: Self-pay | Admitting: Internal Medicine

## 2017-04-10 ENCOUNTER — Ambulatory Visit: Payer: Managed Care, Other (non HMO) | Admitting: Internal Medicine

## 2017-04-10 LAB — HEMOGLOBIN A1C
Hgb A1c MFr Bld: 6.3 % — ABNORMAL HIGH (ref 4.8–5.6)
Mean Plasma Glucose: 134 mg/dL

## 2017-04-10 NOTE — Telephone Encounter (Signed)
Pt called following up. Please advise, thank you!

## 2017-04-10 NOTE — Telephone Encounter (Signed)
Need HFU for patient .with in 14 days of discharge.

## 2017-04-11 NOTE — Telephone Encounter (Signed)
Do not see open spot.  Will have to see her during lunch on 04/17/17 - 12:30.  Thanks

## 2017-04-11 NOTE — Telephone Encounter (Signed)
Transition Care Management Follow-up Telephone Call  How have you been since you were released from the hospital? Felt fine since release no dizziness, denies weakness.    Do you understand why you were in the hospital?Yes   Do you understand the discharge instrcutions? Yes  Items Reviewed:  Medications reviewed: Yes, added Plavix  Allergies reviewed: yes  Dietary changes reviewed: Yes  Referrals reviewed: Yes, follow up with PCP within 14 days.   Functional Questionnaire:   Activities of Daily Living (ADLs):   She states they are independent in the following: Independent in all ADLs. States they require assistance with the following: No assist needed.   Any transportation issues/concerns?:No   Any patient concerns? NO   Confirmed importance and date/time of follow-up visits scheduled:Yes   Confirmed with patient if condition begins to worsen call PCP or go to the ER.  Patient was given the Call-a-Nurse line [phone]: Yes

## 2017-04-17 ENCOUNTER — Ambulatory Visit: Payer: Managed Care, Other (non HMO) | Admitting: Internal Medicine

## 2017-04-17 ENCOUNTER — Ambulatory Visit (INDEPENDENT_AMBULATORY_CARE_PROVIDER_SITE_OTHER): Payer: Medicare HMO | Admitting: Internal Medicine

## 2017-04-17 ENCOUNTER — Encounter: Payer: Self-pay | Admitting: Internal Medicine

## 2017-04-17 VITALS — BP 120/60 | HR 64 | Temp 98.7°F | Resp 12 | Ht 65.0 in | Wt 158.4 lb

## 2017-04-17 DIAGNOSIS — G458 Other transient cerebral ischemic attacks and related syndromes: Secondary | ICD-10-CM | POA: Diagnosis not present

## 2017-04-17 DIAGNOSIS — E611 Iron deficiency: Secondary | ICD-10-CM | POA: Diagnosis not present

## 2017-04-17 DIAGNOSIS — E7800 Pure hypercholesterolemia, unspecified: Secondary | ICD-10-CM

## 2017-04-17 DIAGNOSIS — D508 Other iron deficiency anemias: Secondary | ICD-10-CM | POA: Diagnosis not present

## 2017-04-17 DIAGNOSIS — I10 Essential (primary) hypertension: Secondary | ICD-10-CM | POA: Diagnosis not present

## 2017-04-17 DIAGNOSIS — Z853 Personal history of malignant neoplasm of breast: Secondary | ICD-10-CM | POA: Diagnosis not present

## 2017-04-17 DIAGNOSIS — E1149 Type 2 diabetes mellitus with other diabetic neurological complication: Secondary | ICD-10-CM

## 2017-04-17 LAB — FERRITIN: Ferritin: 27.7 ng/mL (ref 10.0–291.0)

## 2017-04-17 NOTE — Assessment & Plan Note (Signed)
On crestor.  Low cholesterol diet and exercise.  Follow lipid panel and liver function tests.   

## 2017-04-17 NOTE — Assessment & Plan Note (Signed)
Iron deficient.  Saw GI.  Planning for EGD and colonoscopy.  Notified GI of recent TIA.  They are planning to postpone until later date.  Recheck ferritin.  hgb wnl.

## 2017-04-17 NOTE — Assessment & Plan Note (Signed)
Low carb diet and exercise.  Recent a1c 6.3.  Follow met b and a1c.

## 2017-04-17 NOTE — Progress Notes (Signed)
Pre-visit discussion using our clinic review tool. No additional management support is needed unless otherwise documented below in the visit note.  

## 2017-04-17 NOTE — Assessment & Plan Note (Signed)
Mammogram 10/20/16 - Birads II.  Continues f/u at [facility].   

## 2017-04-17 NOTE — Assessment & Plan Note (Signed)
Blood pressure under good control.  Continue same medication regimen.  Follow pressures.  Follow metabolic panel.   

## 2017-04-17 NOTE — Progress Notes (Signed)
Patient ID: Angel Floyd, female   DOB: August 05, 1951, 66 y.o.   MRN: [id number]   Subjective:    Patient ID: Angel Floyd, female    DOB: 05-Jan-1951, 66 y.o.   MRN: [id number]  HPI  Patient here for hospital follow up.  She was admitted 04/08/17 and diagnosed with TIA.  She presented with headache and difficulty with words.  Was evaluated by Dr Doy Mince.  Had CT and MRI/MRA as outlined.  No CVA on MRI.  Neurology consulted.  Dr Doy Mince evaluated.  plavix added.  Since her discharge she has done well.  She is accompanied by her husband.  History obtained from both of them.  No significant headache.  No trouble with finding her words.  No difficulty speaking.  Stays active.  No chest pain.  No sob.  No acid reflux.  No abdominal pain.  Bowels doing better.  Taking iron qod.     Past Medical History:  Diagnosis Date  . Anemia   . Breast cancer (A[place])   . Diabetes mellitus ([place])   . History of abnormal Pap smear    class III, required cryosurgery  . Hypercholesterolemia   . Hypertension   . TIA (transient ischemic attack)    Past Surgical History:  Procedure Laterality Date  . BREAST CYST ASPIRATION  1989  . DILATION AND CURETTAGE OF UTERUS    . FOOT SURGERY     morton's neuroma removed - right foot  . FOOT SURGERY  10/2009   achilles tendon release with reconstructive surgery  . GYNECOLOGIC CRYOSURGERY     class III pap  . HAND SURGERY     cyst removed - left hand  . hysteroscopy and D&C  7/09  . KNEE ARTHROSCOPY Left [id number]   removed tear & resurfaced area  . KNEE SURGERY  09/20/12   torn meniscus, (Dr Leanor Kail)  . TUBAL LIGATION     Family History  Problem Relation Age of Onset  . Lung cancer Father   . Alcohol abuse Father   . Cancer Father        lung  . CVA Mother   . Colon cancer Mother   . Alcohol abuse Mother   . Cancer Mother        colon  . Stroke Mother   . Colon cancer Sister   . Diabetes Maternal Grandfather    Social History   Social History    . Marital status: Married    Spouse name: N/A  . Number of children: 2  . Years of education: N/A   Social History Main Topics  . Smoking status: Never Smoker  . Smokeless tobacco: Never Used  . Alcohol use No  . Drug use: No  . Sexual activity: Not Asked   Other Topics Concern  . None   Social History Narrative  . None    Outpatient Encounter Prescriptions as of 04/17/2017  Medication Sig  . aspirin 81 MG tablet Take 81 mg by mouth daily.  . clopidogrel (PLAVIX) 75 MG tablet Take 1 tablet (75 mg total) by mouth daily.  Marland Kitchen esomeprazole (NEXIUM) 40 MG capsule TAKE 1 CAPSULE BY MOUTH  DAILY BEFORE BREAKFAST  . Fe Fum-FePoly-Vit C-Vit B3 (INTEGRA) 62.5-62.5-40-3 MG CAPS Take 1 capsule by mouth daily.  . fish oil-omega-3 fatty acids 1000 MG capsule Take 2 g by mouth daily.   Marland Kitchen glucose blood test strip Use as instructed to check blood sugars three times daily. Has One Touch Ultra glucometer.  Dx 250.00  . metFORMIN (GLUCOPHAGE) 500 MG tablet TAKE 1 TABLET BY MOUTH  DAILY  . rosuvastatin (CRESTOR) 10 MG tablet TAKE 1 TABLET BY MOUTH  DAILY  . telmisartan (MICARDIS) 20 MG tablet TAKE 1 TABLET BY MOUTH  DAILY   No facility-administered encounter medications on file as of 04/17/2017.     Review of Systems  Constitutional: Negative for appetite change and unexpected weight change.  HENT: Negative for congestion and sinus pressure.   Respiratory: Negative for cough, chest tightness and shortness of breath.   Cardiovascular: Negative for chest pain, palpitations and leg swelling.  Gastrointestinal: Negative for abdominal pain, diarrhea, nausea and vomiting.  Genitourinary: Negative for difficulty urinating and dysuria.  Musculoskeletal: Negative for back pain and joint swelling.  Skin: Negative for color change and rash.  Neurological: Negative for dizziness, light-headedness and headaches.  Psychiatric/Behavioral: Negative for agitation and dysphoric mood.       Objective:      Blood pressure rechecked by me;  130/82  Physical Exam  Constitutional: She appears well-developed and well-nourished. No distress.  HENT:  Nose: Nose normal.  Mouth/Throat: Oropharynx is clear and moist.  Neck: Neck supple. No thyromegaly present.  Cardiovascular: Normal rate and regular rhythm.   Pulmonary/Chest: Breath sounds normal. No respiratory distress. She has no wheezes.  Abdominal: Soft. Bowel sounds are normal. There is no tenderness.  Musculoskeletal: She exhibits no edema or tenderness.  Lymphadenopathy:    She has no cervical adenopathy.  Skin: No rash noted. No erythema.  Psychiatric: She has a normal mood and affect. Her behavior is normal.    BP 120/60 (BP Location: Left Arm, Patient Position: Sitting, Cuff Size: Normal)   Pulse 64   Temp 98.7 F (37.1 C) (Oral)   Resp 12   Ht '5\' 5"'  (1.651 m)   Wt 158 lb 6.4 oz (71.8 kg)   SpO2 98%   BMI 26.36 kg/m  Wt Readings from Last 3 Encounters:  04/17/17 158 lb 6.4 oz (71.8 kg)  04/08/17 156 lb (70.8 kg)  02/08/17 157 lb 9.6 oz (71.5 kg)     Lab Results  Component Value Date   WBC 5.2 04/08/2017   HGB 14.2 04/08/2017   HCT 40.9 04/08/2017   PLT 192 04/08/2017   GLUCOSE 141 (H) 04/08/2017   CHOL 184 04/08/2017   TRIG 71 04/08/2017   HDL 67 04/08/2017   LDLCALC 103 (H) 04/08/2017   ALT 17 04/08/2017   AST 20 04/08/2017   NA 139 04/08/2017   K 3.8 04/08/2017   CL 104 04/08/2017   CREATININE 0.78 04/08/2017   BUN 19 04/08/2017   CO2 28 04/08/2017   TSH 1.06 10/11/2016   INR 0.95 04/08/2017   HGBA1C 6.3 (H) 04/08/2017   MICROALBUR <0.7 02/08/2017    Mr Brain Wo Contrast  Result Date: 04/08/2017 CLINICAL DATA:  5 minute episode of RIGHT-sided headache and word-finding difficulties. History of TIA, diabetes, hypertension, breast cancer, hypercholesterolemia. EXAM: MRI HEAD WITHOUT CONTRAST MRA HEAD WITHOUT CONTRAST TECHNIQUE: Multiplanar, multiecho pulse sequences of the brain and surrounding  structures were obtained without intravenous contrast. Angiographic images of the head were obtained using MRA technique without contrast. COMPARISON:  CT HEAD Apr 16, 2017 at 0851 hours and MRI/MRA head December 25, 2006 FINDINGS: MRI HEAD FINDINGS BRAIN: No reduced diffusion to suggest acute ischemia. No susceptibility artifact to suggest hemorrhage. The ventricles and sulci are normal for patient's age. A few nonspecific punctate supratentorial white matter FLAIR T2 hyperintensities, less  than expected for age. No suspicious parenchymal signal, masses or mass effect. No abnormal extra-axial fluid collections. VASCULAR: Normal major intracranial vascular flow voids present at skull base. SKULL AND UPPER CERVICAL SPINE: No abnormal sellar expansion. No suspicious calvarial bone marrow signal. Craniocervical junction maintained. SINUSES/ORBITS: Lobulated LEFT maxillary sinus mucosal thickening. No paranasal sinus air-fluid levels. Mastoid air cells are well aerated. The included ocular globes and orbital contents are non-suspicious. OTHER: None. MRA HEAD FINDINGS- mild motion degraded examination. ANTERIOR CIRCULATION: Normal flow related enhancement of the included cervical, petrous, cavernous and supraclinoid internal carotid arteries. Patent anterior communicating artery. Normal flow related enhancement of the anterior and middle cerebral arteries, including distal segments, mild proximal middle cerebral artery luminal irregularity compatible with atherosclerosis. No large vessel occlusion, high-grade stenosis, aneurysm. POSTERIOR CIRCULATION: LEFT vertebral artery is dominant. Basilar artery is patent, with normal flow related enhancement of the main branch vessels. Normal flow related enhancement of the posterior cerebral arteries, mild to moderate luminal irregularity compatible with atherosclerosis. Robust bilateral posterior communicating arteries present. No large vessel occlusion, high-grade stenosis,  aneurysm. ANATOMIC VARIANTS: None. Source images and MIP images were reviewed. IMPRESSION: MRI HEAD: Negative noncontrast MRI of the head for age. MRA HEAD: No emergent large vessel occlusion or severe stenosis on this mildly motion degraded examination. Intracranial atherosclerosis resulting in mild-to-moderate stenoses posterior cerebral artery and to lesser extent middle cerebral arteries. Electronically Signed   By: Elon Alas M.D.   On: 04/08/2017 13:44   US Carotid Bilateral (at [facility] And [facility] Only)  Result Date: 04/08/2017 CLINICAL DATA:  TIA.  Hypertension, hyperlipidemia, diabetes. EXAM: BILATERAL CAROTID DUPLEX ULTRASOUND TECHNIQUE: Pearline Cables scale imaging, color Doppler and duplex ultrasound was performed of bilateral carotid and vertebral arteries in the neck. COMPARISON:  12/24/2006 TECHNIQUE: Quantification of carotid stenosis is based on velocity parameters that correlate the residual internal carotid diameter with NASCET-based stenosis levels, using the diameter of the distal internal carotid lumen as the denominator for stenosis measurement. The following velocity measurements were obtained: PEAK SYSTOLIC/END DIASTOLIC RIGHT ICA:                     109/34cm/sec CCA:                     542/70WC/BJS SYSTOLIC ICA/CCA RATIO:  2.83 DIASTOLIC ICA/CCA RATIO: 1.51 ECA:                     1.2cm/sec LEFT ICA:                     110/29cm/sec CCA:                     761/60VP/XTG SYSTOLIC ICA/CCA RATIO:  6.26 DIASTOLIC ICA/CCA RATIO: 9.48 ECA:                     117cm/sec FINDINGS: RIGHT CAROTID ARTERY: Mild tortuosity. There is mild slightly irregular plaque in the distal common carotid artery. Circumferential partially calcified plaque in the carotid bulb extending into the proximal ICA. No high-grade stenosis. Normal waveforms and color Doppler signal. RIGHT VERTEBRAL ARTERY:  Normal flow direction and waveform. LEFT CAROTID ARTERY: Mild eccentric partially calcified plaque in the carotid bulb  extending to the ICA resulting in at least mild stenosis. Normal waveforms and color Doppler signal however. Distal ICA mildly tortuous. LEFT VERTEBRAL ARTERY: Normal flow direction and waveform. IMPRESSION: 1. Bilateral carotid bifurcation and proximal ICA plaque  resulting in less than 50% diameter stenosis. 2.  Antegrade bilateral vertebral arterial flow. Electronically Signed   By: Lucrezia Europe M.D.   On: 04/08/2017 13:56   Mr Jodene Nam Head/brain DG Cm  Result Date: 04/08/2017 CLINICAL DATA:  5 minute episode of RIGHT-sided headache and word-finding difficulties. History of TIA, diabetes, hypertension, breast cancer, hypercholesterolemia. EXAM: MRI HEAD WITHOUT CONTRAST MRA HEAD WITHOUT CONTRAST TECHNIQUE: Multiplanar, multiecho pulse sequences of the brain and surrounding structures were obtained without intravenous contrast. Angiographic images of the head were obtained using MRA technique without contrast. COMPARISON:  CT HEAD Apr 16, 2017 at 0851 hours and MRI/MRA head December 25, 2006 FINDINGS: MRI HEAD FINDINGS BRAIN: No reduced diffusion to suggest acute ischemia. No susceptibility artifact to suggest hemorrhage. The ventricles and sulci are normal for patient's age. A few nonspecific punctate supratentorial white matter FLAIR T2 hyperintensities, less than expected for age. No suspicious parenchymal signal, masses or mass effect. No abnormal extra-axial fluid collections. VASCULAR: Normal major intracranial vascular flow voids present at skull base. SKULL AND UPPER CERVICAL SPINE: No abnormal sellar expansion. No suspicious calvarial bone marrow signal. Craniocervical junction maintained. SINUSES/ORBITS: Lobulated LEFT maxillary sinus mucosal thickening. No paranasal sinus air-fluid levels. Mastoid air cells are well aerated. The included ocular globes and orbital contents are non-suspicious. OTHER: None. MRA HEAD FINDINGS- mild motion degraded examination. ANTERIOR CIRCULATION: Normal flow related  enhancement of the included cervical, petrous, cavernous and supraclinoid internal carotid arteries. Patent anterior communicating artery. Normal flow related enhancement of the anterior and middle cerebral arteries, including distal segments, mild proximal middle cerebral artery luminal irregularity compatible with atherosclerosis. No large vessel occlusion, high-grade stenosis, aneurysm. POSTERIOR CIRCULATION: LEFT vertebral artery is dominant. Basilar artery is patent, with normal flow related enhancement of the main branch vessels. Normal flow related enhancement of the posterior cerebral arteries, mild to moderate luminal irregularity compatible with atherosclerosis. Robust bilateral posterior communicating arteries present. No large vessel occlusion, high-grade stenosis, aneurysm. ANATOMIC VARIANTS: None. Source images and MIP images were reviewed. IMPRESSION: MRI HEAD: Negative noncontrast MRI of the head for age. MRA HEAD: No emergent large vessel occlusion or severe stenosis on this mildly motion degraded examination. Intracranial atherosclerosis resulting in mild-to-moderate stenoses posterior cerebral artery and to lesser extent middle cerebral arteries. Electronically Signed   By: Elon Alas M.D.   On: 04/08/2017 13:44   Ct Head Code Stroke W/o Cm  Result Date: 04/08/2017 CLINICAL DATA:  Code stroke.  Headache, dizziness, history of TIA EXAM: CT HEAD WITHOUT CONTRAST TECHNIQUE: Contiguous axial images were obtained from the base of the skull through the vertex without intravenous contrast. COMPARISON:  None. FINDINGS: Brain: No evidence of acute infarction, hemorrhage, hydrocephalus, extra-axial collection or mass lesion/mass effect. Vascular: No hyperdense vessel or unexpected calcification. Skull: Normal. Negative for fracture or focal lesion. Sinuses/Orbits: No acute finding. Other: None. ASPECTS [facility] Stroke Program Early CT Score) - Ganglionic level infarction (caudate, lentiform  nuclei, internal capsule, insula, M1-M3 cortex): 7 - Supraganglionic infarction (M4-M6 cortex): 3 Total score (0-10 with 10 being normal): 10 IMPRESSION: 1. Negative for bleed or other acute intracranial process. Critical Value/emergent results were called by telephone at the time of interpretation on 04/08/2017 at 9:13 am to Dr. Charlotte Crumb , who verbally acknowledged these results. 2. ASPECTS is 10 Electronically Signed   By: Lucrezia Europe M.D.   On: 04/08/2017 09:13       Assessment & Plan:   Problem List Items Addressed This Visit    Anemia  Iron deficient.  Saw GI.  Planning for EGD and colonoscopy.  Notified GI of recent TIA.  They are planning to postpone until later date.  Recheck ferritin.  hgb wnl.        History of breast cancer    Mammogram 10/20/16 - Birads II.  Continues f/u at [facility].        Hypercholesterolemia    On crestor.  Low cholesterol diet and exercise.  Follow lipid panel and liver function tests.        Hypertension    Blood pressure under good control.  Continue same medication regimen.  Follow pressures.  Follow metabolic panel.        TIA (transient ischemic attack)    Recently admitted.  Diagnosed with TIA. Evaluated by neurology.  plavix added.  Overall doing well now.  Continue risk factor modification.        Type 2 diabetes mellitus with neurological complications (HCC)    Low carb diet and exercise.  Recent a1c 6.3.  Follow met b and a1c.         Other Visit Diagnoses    Iron deficiency    -  Primary   Relevant Orders   Ferritin (Completed)       Einar Pheasant, MD

## 2017-04-17 NOTE — Assessment & Plan Note (Signed)
Recently admitted.  Diagnosed with TIA. Evaluated by neurology.  plavix added.  Overall doing well now.  Continue risk factor modification.

## 2017-04-26 ENCOUNTER — Telehealth: Payer: Self-pay

## 2017-04-26 ENCOUNTER — Telehealth: Payer: Self-pay | Admitting: *Deleted

## 2017-04-26 MED ORDER — ESOMEPRAZOLE MAGNESIUM 40 MG PO CPDR: DELAYED_RELEASE_CAPSULE | ORAL | 1 refills | Status: DC

## 2017-04-26 MED ORDER — ROSUVASTATIN CALCIUM 10 MG PO TABS: 10.0000 mg | ORAL_TABLET | Freq: Every day | ORAL | 1 refills | Status: DC

## 2017-04-26 MED ORDER — TELMISARTAN 20 MG PO TABS: 20.0000 mg | ORAL_TABLET | Freq: Every day | ORAL | 1 refills | Status: DC

## 2017-04-26 MED ORDER — METFORMIN HCL 500 MG PO TABS: 500.0000 mg | ORAL_TABLET | Freq: Every day | ORAL | 1 refills | Status: DC

## 2017-04-26 MED ORDER — CLOPIDOGREL BISULFATE 75 MG PO TABS: 75.0000 mg | ORAL_TABLET | Freq: Every day | ORAL | 1 refills | Status: DC

## 2017-04-26 NOTE — Telephone Encounter (Signed)
[facility] call to discuss [facility]  [phone]

## 2017-04-26 NOTE — Telephone Encounter (Signed)
Spoke to patient I have updated pharmacy in our records. She would like to have new scripts for everything sent to [facility] mail order.   She has never taken zantac and would be fine with trying it. I have informed you are out of the office and will address when you come back.

## 2017-04-26 NOTE — Telephone Encounter (Signed)
Spoke with Dr. Nicki Reaper she wants me to call patient and see if she has taken zantac.

## 2017-04-26 NOTE — Telephone Encounter (Signed)
Spoke to patient she states that she has never taken zantac and is ok with the change. She would like to call be back she is away from home at them moment and needs to check and see what pharmacy she needs to use.

## 2017-04-27 ENCOUNTER — Other Ambulatory Visit: Payer: Self-pay

## 2017-04-27 MED ORDER — ROSUVASTATIN CALCIUM 10 MG PO TABS: 10.0000 mg | ORAL_TABLET | Freq: Every day | ORAL | 1 refills | Status: DC

## 2017-04-27 MED ORDER — TELMISARTAN 20 MG PO TABS: 20.0000 mg | ORAL_TABLET | Freq: Every day | ORAL | 1 refills | Status: DC

## 2017-04-27 MED ORDER — METFORMIN HCL 500 MG PO TABS: 500.0000 mg | ORAL_TABLET | Freq: Every day | ORAL | 1 refills | Status: DC

## 2017-04-27 MED ORDER — CLOPIDOGREL BISULFATE 75 MG PO TABS: 75.0000 mg | ORAL_TABLET | Freq: Every day | ORAL | 1 refills | Status: DC

## 2017-04-27 MED ORDER — INTEGRA 62.5-62.5-40-3 MG PO CAPS: 1.0000 | ORAL_CAPSULE | Freq: Every day | ORAL | 1 refills | Status: DC

## 2017-04-27 NOTE — Telephone Encounter (Signed)
Left message to return call to our office.  Have sent all script to mail order.  Need to give information about med change.

## 2017-04-27 NOTE — Telephone Encounter (Signed)
Pt called back returning your call. Please advise, thank you!  Call pt @ [phone]

## 2017-04-27 NOTE — Telephone Encounter (Signed)
Have her try zantac (ranitidine) 150mg  q day instead of nexium.  She would take this 30 minutes before breakfast.  She can get this over the counter. Let us know if problems with acid reflux or other GI issues.  Also, ok if she needs her other meds sent to mail order.  Can send in #90 with one refill.

## 2017-04-27 NOTE — Telephone Encounter (Signed)
Patient informed will call if any questions.  

## 2017-04-30 NOTE — Telephone Encounter (Signed)
Spoke to patient informed all scripts sen to mail order. Not sure why [facility] called with some to pick up. She will double check and make sure that all are at mail order.

## 2017-04-30 NOTE — Telephone Encounter (Signed)
Patient stated that [facility], she [facility] a [facility] from [facility] on [facility]. Patient would like Rx to go to [facility] delivery  Pt contact [phone]

## 2017-05-01 ENCOUNTER — Encounter: Payer: Self-pay | Admitting: Internal Medicine

## 2017-05-02 MED ORDER — RANITIDINE HCL 150 MG PO TABS: 150.0000 mg | ORAL_TABLET | Freq: Every day | ORAL | 1 refills | Status: DC

## 2017-05-02 NOTE — Telephone Encounter (Signed)
I have sent the rx to [facility] for zantac #90 with one refill.

## 2017-05-04 ENCOUNTER — Telehealth: Payer: Self-pay | Admitting: Internal Medicine

## 2017-05-04 NOTE — Telephone Encounter (Signed)
Pt called and is requesting Fe Fum-FePoly-Vit C-Vit B3 (INTEGRA) 62.5-62.5-40-3 MG CAPS. Pt states that [facility] has denied it because they have not heard back from the office. Pt states that we need to send it to pills department fax # [phone] or call [phone] description topic number [id number].

## 2017-05-07 NOTE — Telephone Encounter (Signed)
I have called pharmacy they have but have not filled because it is not covered. I was transferred for to submit non formulary request but was d/c

## 2017-05-08 NOTE — Telephone Encounter (Signed)
Called back given another number to call. I called and was told by jennifer that it was denied on 05/01/17. Need to call patient and let her know.    [phone]

## 2017-05-08 NOTE — Telephone Encounter (Signed)
Called patient to let her know we will have to start appeal and we will let her know when we have done so.

## 2017-05-18 NOTE — Telephone Encounter (Signed)
Appeal has been started pending response.

## 2017-06-01 NOTE — Telephone Encounter (Signed)
Pt called to check on the status of appeal.. Please advise

## 2017-06-08 NOTE — Telephone Encounter (Signed)
Called and was told no response as of yet

## 2017-06-21 ENCOUNTER — Encounter: Payer: Self-pay | Admitting: Internal Medicine

## 2017-06-21 ENCOUNTER — Ambulatory Visit (INDEPENDENT_AMBULATORY_CARE_PROVIDER_SITE_OTHER): Payer: Medicare HMO | Admitting: Internal Medicine

## 2017-06-21 VITALS — BP 118/58 | HR 67 | Temp 98.6°F | Resp 12 | Ht 65.0 in | Wt 162.2 lb

## 2017-06-21 DIAGNOSIS — E7800 Pure hypercholesterolemia, unspecified: Secondary | ICD-10-CM | POA: Diagnosis not present

## 2017-06-21 DIAGNOSIS — R0989 Other specified symptoms and signs involving the circulatory and respiratory systems: Secondary | ICD-10-CM

## 2017-06-21 DIAGNOSIS — Z853 Personal history of malignant neoplasm of breast: Secondary | ICD-10-CM

## 2017-06-21 DIAGNOSIS — Z23 Encounter for immunization: Secondary | ICD-10-CM | POA: Diagnosis not present

## 2017-06-21 DIAGNOSIS — E1149 Type 2 diabetes mellitus with other diabetic neurological complication: Secondary | ICD-10-CM

## 2017-06-21 DIAGNOSIS — G458 Other transient cerebral ischemic attacks and related syndromes: Secondary | ICD-10-CM

## 2017-06-21 DIAGNOSIS — I10 Essential (primary) hypertension: Secondary | ICD-10-CM

## 2017-06-21 MED ORDER — TETANUS-DIPHTH-ACELL PERTUSSIS 5-2.5-18.5 LF-MCG/0.5 IM SUSP: 0.5000 mL | Freq: Once | INTRAMUSCULAR | 0 refills | Status: AC

## 2017-06-21 MED ORDER — ZOSTER VAC RECOMB ADJUVANTED 50 MCG/0.5ML IM SUSR: 0.5000 mL | Freq: Once | INTRAMUSCULAR | 0 refills | Status: AC

## 2017-06-21 NOTE — Progress Notes (Signed)
Patient ID: Angel Floyd, female   DOB: 1951-05-04, 66 y.o.   MRN: [id number]   Subjective:    Patient ID: Angel Floyd, female    DOB: 01-03-1951, 66 y.o.   MRN: [id number]  HPI  Patient here for a scheduled follow up. She is accompanied by her husband.  History obtained from both of them.  Admitted 04/08/17 and diagnosed with TIA.  plavix added.  She reports some bruising. Discussed the need to stay on plavix.  States overall she feels she is doing relatively well.  Trying to stay active.  No chest pain.  No sob.  No acid reflux.  No abdominal pain.  Bowels moving.  States blood sugars averaging 120-130s.     Past Medical History:  Diagnosis Date  . Anemia   . Breast cancer ([place])   . Diabetes mellitus ([place])   . History of abnormal Pap smear    class III, required cryosurgery  . Hypercholesterolemia   . Hypertension   . TIA (transient ischemic attack)    Past Surgical History:  Procedure Laterality Date  . BREAST CYST ASPIRATION  1989  . DILATION AND CURETTAGE OF UTERUS    . FOOT SURGERY     morton's neuroma removed - right foot  . FOOT SURGERY  10/2009   achilles tendon release with reconstructive surgery  . GYNECOLOGIC CRYOSURGERY     class III pap  . HAND SURGERY     cyst removed - left hand  . hysteroscopy and D&C  7/09  . KNEE ARTHROSCOPY Left [id number]   removed tear & resurfaced area  . KNEE SURGERY  09/20/12   torn meniscus, (Dr Leanor Kail)  . TUBAL LIGATION     Family History  Problem Relation Age of Onset  . Lung cancer Father   . Alcohol abuse Father   . Cancer Father        lung  . CVA Mother   . Colon cancer Mother   . Alcohol abuse Mother   . Cancer Mother        colon  . Stroke Mother   . Colon cancer Sister   . Diabetes Maternal Grandfather    Social History   Social History  . Marital status: Married    Spouse name: N/A  . Number of children: 2  . Years of education: N/A   Social History Main Topics  . Smoking status: Never Smoker    . Smokeless tobacco: Never Used  . Alcohol use No  . Drug use: No  . Sexual activity: Not Asked   Other Topics Concern  . None   Social History Narrative  . None    Outpatient Encounter Prescriptions as of 06/21/2017  Medication Sig  . aspirin 81 MG tablet Take 81 mg by mouth daily.  . clopidogrel (PLAVIX) 75 MG tablet Take 1 tablet (75 mg total) by mouth daily.  . Fe Fum-FePoly-Vit C-Vit B3 (INTEGRA) 62.5-62.5-40-3 MG CAPS Take 1 capsule by mouth daily.  . fish oil-omega-3 fatty acids 1000 MG capsule Take 2 g by mouth daily.   Marland Kitchen glucose blood test strip Use as instructed to check blood sugars three times daily. Has One Touch Ultra glucometer. Dx 250.00  . metFORMIN (GLUCOPHAGE) 500 MG tablet Take 1 tablet (500 mg total) by mouth daily.  . ranitidine (ZANTAC) 150 MG tablet Take 1 tablet (150 mg total) by mouth daily.  . rosuvastatin (CRESTOR) 10 MG tablet Take 1 tablet (10 mg total) by mouth daily.  Marland Kitchen  telmisartan (MICARDIS) 20 MG tablet Take 1 tablet (20 mg total) by mouth daily.  . [EXPIRED] Tdap (BOOSTRIX) 5-2.5-18.5 LF-MCG/0.5 injection Inject 0.5 mLs into the muscle once.  . [EXPIRED] Zoster Vac Recomb Adjuvanted (SHINGRIX) injection Inject 0.5 mLs into the muscle once.   No facility-administered encounter medications on file as of 06/21/2017.     Review of Systems  Constitutional: Negative for appetite change and unexpected weight change.  HENT: Negative for congestion and sinus pressure.   Respiratory: Negative for cough, chest tightness and shortness of breath.   Cardiovascular: Negative for chest pain, palpitations and leg swelling.  Gastrointestinal: Negative for abdominal pain, diarrhea, nausea and vomiting.  Genitourinary: Negative for difficulty urinating and dysuria.  Musculoskeletal: Negative for back pain and joint swelling.  Skin: Negative for color change and rash.  Neurological: Negative for dizziness, light-headedness and headaches.  Psychiatric/Behavioral:  Negative for agitation and dysphoric mood.       Objective:    Physical Exam  Constitutional: She appears well-developed and well-nourished. No distress.  HENT:  Nose: Nose normal.  Mouth/Throat: Oropharynx is clear and moist.  Neck: Neck supple. No thyromegaly present.  Cardiovascular: Normal rate and regular rhythm.   Pulmonary/Chest: Breath sounds normal. No respiratory distress. She has no wheezes.  Abdominal: Soft. Bowel sounds are normal. There is no tenderness.  Musculoskeletal: She exhibits no edema or tenderness.  Lymphadenopathy:    She has no cervical adenopathy.  Skin: No rash noted. No erythema.  Psychiatric: She has a normal mood and affect. Her behavior is normal.    BP (!) 118/58 (BP Location: Left Arm, Patient Position: Sitting, Cuff Size: Normal)   Pulse 67   Temp 98.6 F (37 C) (Oral)   Resp 12   Ht _0  (1.651 m)   Wt 162 lb 3.2 oz (73.6 kg)   SpO2 96%   BMI 26.99 kg/m  Wt Readings from Last 3 Encounters:  06/21/17 162 lb 3.2 oz (73.6 kg)  04/17/17 158 lb 6.4 oz (71.8 kg)  04/08/17 156 lb (70.8 kg)     Lab Results  Component Value Date   WBC 5.2 04/08/2017   HGB 14.2 04/08/2017   HCT 40.9 04/08/2017   PLT 192 04/08/2017   GLUCOSE 141 (H) 04/08/2017   CHOL 184 04/08/2017   TRIG 71 04/08/2017   HDL 67 04/08/2017   LDLCALC 103 (H) 04/08/2017   ALT 17 04/08/2017   AST 20 04/08/2017   NA 139 04/08/2017   K 3.8 04/08/2017   CL 104 04/08/2017   CREATININE 0.78 04/08/2017   BUN 19 04/08/2017   CO2 28 04/08/2017   TSH 1.06 10/11/2016   INR 0.95 04/08/2017   HGBA1C 6.3 (H) 04/08/2017   MICROALBUR <0.7 02/08/2017    Mr Brain Wo Contrast  Addendum Date: 05/02/2017   ADDENDUM REPORT: 05/02/2017 21:49 ADDENDUM: Though initial study nodes states the patient presented with RIGHT-sided headache, requested addendum states the patient's headaches are LEFT-sided. Electronically Signed   By: Elon Alas M.D.   On: 05/02/2017 21:49   Result  Date: 05/02/2017 CLINICAL DATA:  5 minute episode of RIGHT-sided headache and word-finding difficulties. History of TIA, diabetes, hypertension, breast cancer, hypercholesterolemia. EXAM: MRI HEAD WITHOUT CONTRAST MRA HEAD WITHOUT CONTRAST TECHNIQUE: Multiplanar, multiecho pulse sequences of the brain and surrounding structures were obtained without intravenous contrast. Angiographic images of the head were obtained using MRA technique without contrast. COMPARISON:  CT HEAD Apr 16, 2017 at 0851 hours and MRI/MRA head December 25, 2006  FINDINGS: MRI HEAD FINDINGS BRAIN: No reduced diffusion to suggest acute ischemia. No susceptibility artifact to suggest hemorrhage. The ventricles and sulci are normal for patient's age. A few nonspecific punctate supratentorial white matter FLAIR T2 hyperintensities, less than expected for age. No suspicious parenchymal signal, masses or mass effect. No abnormal extra-axial fluid collections. VASCULAR: Normal major intracranial vascular flow voids present at skull base. SKULL AND UPPER CERVICAL SPINE: No abnormal sellar expansion. No suspicious calvarial bone marrow signal. Craniocervical junction maintained. SINUSES/ORBITS: Lobulated LEFT maxillary sinus mucosal thickening. No paranasal sinus air-fluid levels. Mastoid air cells are well aerated. The included ocular globes and orbital contents are non-suspicious. OTHER: None. MRA HEAD FINDINGS- mild motion degraded examination. ANTERIOR CIRCULATION: Normal flow related enhancement of the included cervical, petrous, cavernous and supraclinoid internal carotid arteries. Patent anterior communicating artery. Normal flow related enhancement of the anterior and middle cerebral arteries, including distal segments, mild proximal middle cerebral artery luminal irregularity compatible with atherosclerosis. No large vessel occlusion, high-grade stenosis, aneurysm. POSTERIOR CIRCULATION: LEFT vertebral artery is dominant. Basilar artery is  patent, with normal flow related enhancement of the main branch vessels. Normal flow related enhancement of the posterior cerebral arteries, mild to moderate luminal irregularity compatible with atherosclerosis. Robust bilateral posterior communicating arteries present. No large vessel occlusion, high-grade stenosis, aneurysm. ANATOMIC VARIANTS: None. Source images and MIP images were reviewed. IMPRESSION: MRI HEAD: Negative noncontrast MRI of the head for age. MRA HEAD: No emergent large vessel occlusion or severe stenosis on this mildly motion degraded examination. Intracranial atherosclerosis resulting in mild-to-moderate stenoses posterior cerebral artery and to lesser extent middle cerebral arteries. Electronically Signed: By: Elon Alas M.D. On: 04/08/2017 13:44   US Carotid Bilateral (at [facility] Only)  Result Date: 04/08/2017 CLINICAL DATA:  TIA.  Hypertension, hyperlipidemia, diabetes. EXAM: BILATERAL CAROTID DUPLEX ULTRASOUND TECHNIQUE: Pearline Cables scale imaging, color Doppler and duplex ultrasound was performed of bilateral carotid and vertebral arteries in the neck. COMPARISON:  12/24/2006 TECHNIQUE: Quantification of carotid stenosis is based on velocity parameters that correlate the residual internal carotid diameter with NASCET-based stenosis levels, using the diameter of the distal internal carotid lumen as the denominator for stenosis measurement. The following velocity measurements were obtained: PEAK SYSTOLIC/END DIASTOLIC RIGHT ICA:                     109/34cm/sec CCA:                     914/78GN/FAO SYSTOLIC ICA/CCA RATIO:  1.30 DIASTOLIC ICA/CCA RATIO: 8.65 ECA:                     1.2cm/sec LEFT ICA:                     110/29cm/sec CCA:                     784/69GE/XBM SYSTOLIC ICA/CCA RATIO:  8.41 DIASTOLIC ICA/CCA RATIO: 3.24 ECA:                     117cm/sec FINDINGS: RIGHT CAROTID ARTERY: Mild tortuosity. There is mild slightly irregular plaque in the distal common carotid  artery. Circumferential partially calcified plaque in the carotid bulb extending into the proximal ICA. No high-grade stenosis. Normal waveforms and color Doppler signal. RIGHT VERTEBRAL ARTERY:  Normal flow direction and waveform. LEFT CAROTID ARTERY: Mild eccentric partially calcified plaque in the carotid bulb extending to the ICA  resulting in at least mild stenosis. Normal waveforms and color Doppler signal however. Distal ICA mildly tortuous. LEFT VERTEBRAL ARTERY: Normal flow direction and waveform. IMPRESSION: 1. Bilateral carotid bifurcation and proximal ICA plaque resulting in less than 50% diameter stenosis. 2.  Antegrade bilateral vertebral arterial flow. Electronically Signed   By: Lucrezia Europe M.D.   On: 04/08/2017 13:56   Mr Jodene Nam Head/brain UU Cm  Addendum Date: 05/02/2017   ADDENDUM REPORT: 05/02/2017 21:49 ADDENDUM: Though initial study nodes states the patient presented with RIGHT-sided headache, requested addendum states the patient's headaches are LEFT-sided. Electronically Signed   By: Elon Alas M.D.   On: 05/02/2017 21:49   Result Date: 05/02/2017 CLINICAL DATA:  5 minute episode of RIGHT-sided headache and word-finding difficulties. History of TIA, diabetes, hypertension, breast cancer, hypercholesterolemia. EXAM: MRI HEAD WITHOUT CONTRAST MRA HEAD WITHOUT CONTRAST TECHNIQUE: Multiplanar, multiecho pulse sequences of the brain and surrounding structures were obtained without intravenous contrast. Angiographic images of the head were obtained using MRA technique without contrast. COMPARISON:  CT HEAD Apr 16, 2017 at 0851 hours and MRI/MRA head December 25, 2006 FINDINGS: MRI HEAD FINDINGS BRAIN: No reduced diffusion to suggest acute ischemia. No susceptibility artifact to suggest hemorrhage. The ventricles and sulci are normal for patient's age. A few nonspecific punctate supratentorial white matter FLAIR T2 hyperintensities, less than expected for age. No suspicious parenchymal  signal, masses or mass effect. No abnormal extra-axial fluid collections. VASCULAR: Normal major intracranial vascular flow voids present at skull base. SKULL AND UPPER CERVICAL SPINE: No abnormal sellar expansion. No suspicious calvarial bone marrow signal. Craniocervical junction maintained. SINUSES/ORBITS: Lobulated LEFT maxillary sinus mucosal thickening. No paranasal sinus air-fluid levels. Mastoid air cells are well aerated. The included ocular globes and orbital contents are non-suspicious. OTHER: None. MRA HEAD FINDINGS- mild motion degraded examination. ANTERIOR CIRCULATION: Normal flow related enhancement of the included cervical, petrous, cavernous and supraclinoid internal carotid arteries. Patent anterior communicating artery. Normal flow related enhancement of the anterior and middle cerebral arteries, including distal segments, mild proximal middle cerebral artery luminal irregularity compatible with atherosclerosis. No large vessel occlusion, high-grade stenosis, aneurysm. POSTERIOR CIRCULATION: LEFT vertebral artery is dominant. Basilar artery is patent, with normal flow related enhancement of the main branch vessels. Normal flow related enhancement of the posterior cerebral arteries, mild to moderate luminal irregularity compatible with atherosclerosis. Robust bilateral posterior communicating arteries present. No large vessel occlusion, high-grade stenosis, aneurysm. ANATOMIC VARIANTS: None. Source images and MIP images were reviewed. IMPRESSION: MRI HEAD: Negative noncontrast MRI of the head for age. MRA HEAD: No emergent large vessel occlusion or severe stenosis on this mildly motion degraded examination. Intracranial atherosclerosis resulting in mild-to-moderate stenoses posterior cerebral artery and to lesser extent middle cerebral arteries. Electronically Signed: By: Elon Alas M.D. On: 04/08/2017 13:44   Ct Head Code Stroke W/o Cm  Result Date: 04/08/2017 CLINICAL DATA:  Code  stroke.  Headache, dizziness, history of TIA EXAM: CT HEAD WITHOUT CONTRAST TECHNIQUE: Contiguous axial images were obtained from the base of the skull through the vertex without intravenous contrast. COMPARISON:  None. FINDINGS: Brain: No evidence of acute infarction, hemorrhage, hydrocephalus, extra-axial collection or mass lesion/mass effect. Vascular: No hyperdense vessel or unexpected calcification. Skull: Normal. Negative for fracture or focal lesion. Sinuses/Orbits: No acute finding. Other: None. ASPECTS [facility] Stroke Program Early CT Score) - Ganglionic level infarction (caudate, lentiform nuclei, internal capsule, insula, M1-M3 cortex): 7 - Supraganglionic infarction (M4-M6 cortex): 3 Total score (0-10 with 10 being normal): 10 IMPRESSION: 1.  Negative for bleed or other acute intracranial process. Critical Value/emergent results were called by telephone at the time of interpretation on 04/08/2017 at 9:13 am to Dr. Charlotte Crumb , who verbally acknowledged these results. 2. ASPECTS is 10 Electronically Signed   By: Lucrezia Europe M.D.   On: 04/08/2017 09:13       Assessment & Plan:   Problem List Items Addressed This Visit    Carotid bruit    Had carotid ultrasound for w/up for recent TIA.  <50%.  Follow.  Continue risk factor modification.  Continues on aspirin and plavix.       History of breast cancer    Mammogram 10/20/16 - birads II.  Continues f/u at [facility].        Hypercholesterolemia    On crestor.  Follow lipid panel and liver function tests.  Low cholesterol diet and exercise.        Relevant Orders   Lipid panel   Hepatic function panel   Hypertension    Blood pressure under good control.  Continue same medication regimen.  Follow pressures.  Follow metabolic panel.        Relevant Orders   TSH   TIA (transient ischemic attack)    On plavix.  Saw neurology.  Continue risk factor modification.        Type 2 diabetes mellitus with neurological complications (HCC)    Sugars  as outlined.  Continue same medications.  Follow met b and a1c.        Relevant Orders   Hemoglobin B3X   Basic metabolic panel   Microalbumin / creatinine urine ratio    Other Visit Diagnoses    Need for Tdap vaccination    -  Primary       Einar Pheasant, MD

## 2017-06-21 NOTE — Progress Notes (Signed)
Pre-visit discussion using our clinic review tool. No additional management support is needed unless otherwise documented below in the visit note.  

## 2017-06-23 ENCOUNTER — Encounter: Payer: Self-pay | Admitting: Internal Medicine

## 2017-06-23 NOTE — Assessment & Plan Note (Signed)
Had carotid ultrasound for w/up for recent TIA.  <50%.  Follow.  Continue risk factor modification.  Continues on aspirin and plavix.

## 2017-06-23 NOTE — Assessment & Plan Note (Signed)
Sugars as outlined.  Continue same medications.  Follow met b and a1c.

## 2017-06-23 NOTE — Assessment & Plan Note (Signed)
On plavix.  Saw neurology.  Continue risk factor modification.

## 2017-06-23 NOTE — Assessment & Plan Note (Signed)
Blood pressure under good control.  Continue same medication regimen.  Follow pressures.  Follow metabolic panel.   

## 2017-06-23 NOTE — Assessment & Plan Note (Signed)
On crestor. Follow lipid panel and liver function tests.  Low cholesterol diet and exercise.  

## 2017-06-23 NOTE — Assessment & Plan Note (Signed)
Mammogram 10/20/16 - birads II.  Continues f/u at [facility].

## 2017-07-11 ENCOUNTER — Telehealth: Payer: Self-pay | Admitting: Internal Medicine

## 2017-07-11 NOTE — Telephone Encounter (Signed)
Pt called about her medication with [employer] home delivery concerning her Fe Fum-FePoly-Vit C-Vit B3 (INTEGRA) 62.5-62.5-40-3 MG CAPS. Pt states an appeal was supposed to have been done pt states she just got off of  the phone and they don't have no appeal. Please advise?  Call pt @ [phone]. Thank you!

## 2017-07-12 DIAGNOSIS — E119 Type 2 diabetes mellitus without complications: Secondary | ICD-10-CM | POA: Diagnosis not present

## 2017-07-12 LAB — HM DIABETES EYE EXAM

## 2017-07-12 NOTE — Telephone Encounter (Signed)
I did not get a chance to call yesterday about this. You wanted me to let you know so that you could try to today. If not let me know and I can call Friday.

## 2017-07-15 ENCOUNTER — Encounter: Payer: Self-pay | Admitting: Internal Medicine

## 2017-07-16 NOTE — Telephone Encounter (Signed)
Insurance recommendation was to file new PA this was filed to [employer] today using cover my meds (Key: [id number]). Insurance had no HX of PA or appeal on file per [employer].

## 2017-07-16 NOTE — Telephone Encounter (Signed)
Received information from the insurance company they have denied states that it will not pay for any over the counter with or with out script.   FYI it can by purchased at [facility] for 23.89 for a one month supply. Or 90 Day $39.95

## 2017-07-16 NOTE — Telephone Encounter (Signed)
Notify pt that insurance denied appear for integra.

## 2017-07-17 NOTE — Telephone Encounter (Signed)
Left message to return call to our office.  

## 2017-07-19 NOTE — Telephone Encounter (Signed)
Left message to return call to our office.  

## 2017-07-24 NOTE — Telephone Encounter (Signed)
Patient called back given information. Will get over the counter.

## 2017-07-24 NOTE — Telephone Encounter (Signed)
Patient sent message via Mychart to contact office.

## 2017-08-13 ENCOUNTER — Encounter: Payer: Self-pay | Admitting: Internal Medicine

## 2017-09-17 ENCOUNTER — Other Ambulatory Visit: Payer: Medicare HMO

## 2017-09-19 ENCOUNTER — Other Ambulatory Visit (INDEPENDENT_AMBULATORY_CARE_PROVIDER_SITE_OTHER): Payer: Medicare HMO

## 2017-09-19 DIAGNOSIS — E7800 Pure hypercholesterolemia, unspecified: Secondary | ICD-10-CM | POA: Diagnosis not present

## 2017-09-19 DIAGNOSIS — E1149 Type 2 diabetes mellitus with other diabetic neurological complication: Secondary | ICD-10-CM | POA: Diagnosis not present

## 2017-09-19 DIAGNOSIS — I10 Essential (primary) hypertension: Secondary | ICD-10-CM | POA: Diagnosis not present

## 2017-09-19 LAB — BASIC METABOLIC PANEL
BUN: 20 mg/dL (ref 6–23)
CALCIUM: 9.4 mg/dL (ref 8.4–10.5)
CO2: 31 meq/L (ref 19–32)
Chloride: 105 mEq/L (ref 96–112)
Creatinine, Ser: 0.69 mg/dL (ref 0.40–1.20)
GFR: 90.59 mL/min (ref >60.00)
GLUCOSE: 129 mg/dL — AB (ref 70–99)
POTASSIUM: 3.9 meq/L (ref 3.5–5.1)
SODIUM: 142 meq/L (ref 135–145)

## 2017-09-19 LAB — HEPATIC FUNCTION PANEL
ALT: 19 U/L (ref 0–35)
AST: 17 U/L (ref 0–37)
Albumin: 4.3 g/dL (ref 3.5–5.2)
Alkaline Phosphatase: 64 U/L (ref 39–117)
Bilirubin, Direct: 0.1 mg/dL (ref 0.0–0.3)
Total Bilirubin: 0.6 mg/dL (ref 0.2–1.2)
Total Protein: 6.9 g/dL (ref 6.0–8.3)

## 2017-09-19 LAB — LIPID PANEL
CHOL/HDL RATIO: 3
Cholesterol: 164 mg/dL (ref 0–200)
HDL: 61.4 mg/dL (ref >39.00)
LDL CALC: 93 mg/dL (ref 0–99)
NonHDL: 102.65
TRIGLYCERIDES: 46 mg/dL (ref 0.0–149.0)
VLDL: 9.2 mg/dL (ref 0.0–40.0)

## 2017-09-19 LAB — MICROALBUMIN / CREATININE URINE RATIO
CREATININE, U: 142.5 mg/dL
MICROALB/CREAT RATIO: 0.7 mg/g (ref 0.0–30.0)
Microalb, Ur: 1 mg/dL (ref 0.0–1.9)

## 2017-09-19 LAB — TSH: TSH: 1.38 u[IU]/mL (ref 0.35–4.50)

## 2017-09-19 LAB — HEMOGLOBIN A1C: HEMOGLOBIN A1C: 6.4 % (ref 4.6–6.5)

## 2017-09-20 ENCOUNTER — Other Ambulatory Visit (HOSPITAL_COMMUNITY)
Admission: RE | Admit: 2017-09-20 | Discharge: 2017-09-20 | Disposition: A | Discharge status: Home / Self Care | Payer: Medicare HMO | Source: Ambulatory Visit | Attending: Internal Medicine | Admitting: Internal Medicine

## 2017-09-20 ENCOUNTER — Encounter: Payer: Self-pay | Admitting: Internal Medicine

## 2017-09-20 ENCOUNTER — Ambulatory Visit (INDEPENDENT_AMBULATORY_CARE_PROVIDER_SITE_OTHER): Payer: Medicare HMO | Admitting: Internal Medicine

## 2017-09-20 VITALS — BP 134/70 | HR 79 | Temp 98.1°F | Resp 18 | Ht 64.96 in | Wt 162.6 lb

## 2017-09-20 DIAGNOSIS — Z124 Encounter for screening for malignant neoplasm of cervix: Secondary | ICD-10-CM | POA: Diagnosis not present

## 2017-09-20 DIAGNOSIS — Z853 Personal history of malignant neoplasm of breast: Secondary | ICD-10-CM

## 2017-09-20 DIAGNOSIS — L989 Disorder of the skin and subcutaneous tissue, unspecified: Secondary | ICD-10-CM

## 2017-09-20 DIAGNOSIS — E7800 Pure hypercholesterolemia, unspecified: Secondary | ICD-10-CM | POA: Diagnosis not present

## 2017-09-20 DIAGNOSIS — I10 Essential (primary) hypertension: Secondary | ICD-10-CM

## 2017-09-20 DIAGNOSIS — G459 Transient cerebral ischemic attack, unspecified: Secondary | ICD-10-CM

## 2017-09-20 DIAGNOSIS — M79674 Pain in right toe(s): Secondary | ICD-10-CM | POA: Diagnosis not present

## 2017-09-20 DIAGNOSIS — E1149 Type 2 diabetes mellitus with other diabetic neurological complication: Secondary | ICD-10-CM | POA: Diagnosis not present

## 2017-09-20 NOTE — Progress Notes (Signed)
Patient ID: Angel Floyd, female   DOB: August 18, 1951, 66 y.o.   MRN: [id number]   Subjective:    Patient ID: Angel Floyd, female    DOB: 11/26/1950, 66 y.o.   MRN: [id number]  HPI  Patient here for a scheduled follow up.  She reports she is doing well.  Has not been exercising regularly.  She is back walking now.  Planning to get more serious about her diet and exercise.  No chest pain.  No sob.  No acid reflux.  No abdominal pain.  Bowels moving.  Did hit her right 5th toe. Pain initially.  Better now.  No significant pain now.  Right leg lesion.  Improved.     Past Medical History:  Diagnosis Date  . Anemia   . Breast cancer ([place])   . Diabetes mellitus ([place])   . History of abnormal Pap smear    class III, required cryosurgery  . Hypercholesterolemia   . Hypertension   . TIA (transient ischemic attack)    Past Surgical History:  Procedure Laterality Date  . BREAST CYST ASPIRATION  1989  . DILATION AND CURETTAGE OF UTERUS    . FOOT SURGERY     morton's neuroma removed - right foot  . FOOT SURGERY  10/2009   achilles tendon release with reconstructive surgery  . GYNECOLOGIC CRYOSURGERY     class III pap  . HAND SURGERY     cyst removed - left hand  . hysteroscopy and D&C  7/09  . KNEE ARTHROSCOPY Left [id number]   removed tear & resurfaced area  . KNEE SURGERY  09/20/12   torn meniscus, (Dr Leanor Kail)  . TUBAL LIGATION     Family History  Problem Relation Age of Onset  . Lung cancer Father   . Alcohol abuse Father   . Cancer Father        lung  . CVA Mother   . Colon cancer Mother   . Alcohol abuse Mother   . Cancer Mother        colon  . Stroke Mother   . Colon cancer Sister   . Diabetes Maternal Grandfather    Social History   Socioeconomic History  . Marital status: Married    Spouse name: None  . Number of children: 2  . Years of education: None  . Highest education level: None  Social Needs  . Financial resource strain: None  . Food insecurity  - worry: None  . Food insecurity - inability: None  . Transportation needs - medical: None  . Transportation needs - non-medical: None  Occupational History  . None  Tobacco Use  . Smoking status: Never Smoker  . Smokeless tobacco: Never Used  Substance and Sexual Activity  . Alcohol use: No    Alcohol/week: 0.0 oz  . Drug use: No  . Sexual activity: None  Other Topics Concern  . None  Social History Narrative  . None    Outpatient Encounter Medications as of 09/20/2017  Medication Sig  . aspirin 81 MG tablet Take 81 mg by mouth daily.  . clopidogrel (PLAVIX) 75 MG tablet Take 1 tablet (75 mg total) by mouth daily.  . Fe Fum-FePoly-Vit C-Vit B3 (INTEGRA) 62.5-62.5-40-3 MG CAPS Take 1 capsule by mouth daily.  . fish oil-omega-3 fatty acids 1000 MG capsule Take 2 g by mouth daily.   Marland Kitchen glucose blood test strip Use as instructed to check blood sugars three times daily. Has One Touch Ultra glucometer.  Dx 250.00  . metFORMIN (GLUCOPHAGE) 500 MG tablet Take 1 tablet (500 mg total) by mouth daily.  . ranitidine (ZANTAC) 150 MG tablet Take 1 tablet (150 mg total) by mouth daily.  . rosuvastatin (CRESTOR) 10 MG tablet Take 1 tablet (10 mg total) by mouth daily.  Marland Kitchen telmisartan (MICARDIS) 20 MG tablet Take 1 tablet (20 mg total) by mouth daily.   No facility-administered encounter medications on file as of 09/20/2017.     Review of Systems  Constitutional: Negative for appetite change and unexpected weight change.  HENT: Negative for congestion and sinus pressure.   Respiratory: Negative for cough, chest tightness and shortness of breath.   Cardiovascular: Negative for chest pain, palpitations and leg swelling.  Gastrointestinal: Negative for abdominal pain, diarrhea, nausea and vomiting.  Genitourinary: Negative for difficulty urinating and dysuria.  Musculoskeletal: Negative for back pain and joint swelling.  Skin: Negative for color change and rash.  Neurological: Negative for  dizziness and headaches.  Psychiatric/Behavioral: Negative for agitation and dysphoric mood.       Objective:    Physical Exam  Constitutional: She appears well-developed and well-nourished. No distress.  HENT:  Nose: Nose normal.  Mouth/Throat: Oropharynx is clear and moist.  Neck: Neck supple. No thyromegaly present.  Cardiovascular: Normal rate and regular rhythm.  1/6 systolic murmur.   Pulmonary/Chest: Breath sounds normal. No respiratory distress. She has no wheezes.  Abdominal: Soft. Bowel sounds are normal. There is no tenderness.  Musculoskeletal: She exhibits no edema or tenderness.  Lymphadenopathy:    She has no cervical adenopathy.  Skin: No rash noted. No erythema.  Psychiatric: She has a normal mood and affect. Her behavior is normal.    BP 134/70 (BP Location: Left Arm, Patient Position: Sitting, Cuff Size: Normal)   Pulse 79   Temp 98.1 F (36.7 C) (Oral)   Resp 18   Ht 5' 4.96" (1.65 m)   Wt 162 lb 9.6 oz (73.8 kg)   SpO2 95%   BMI 27.09 kg/m  Wt Readings from Last 3 Encounters:  09/20/17 162 lb 9.6 oz (73.8 kg)  06/21/17 162 lb 3.2 oz (73.6 kg)  04/17/17 158 lb 6.4 oz (71.8 kg)     Lab Results  Component Value Date   WBC 5.2 04/08/2017   HGB 14.2 04/08/2017   HCT 40.9 04/08/2017   PLT 192 04/08/2017   GLUCOSE 129 (H) 09/19/2017   CHOL 164 09/19/2017   TRIG 46.0 09/19/2017   HDL 61.40 09/19/2017   LDLCALC 93 09/19/2017   ALT 19 09/19/2017   AST 17 09/19/2017   NA 142 09/19/2017   K 3.9 09/19/2017   CL 105 09/19/2017   CREATININE 0.69 09/19/2017   BUN 20 09/19/2017   CO2 31 09/19/2017   TSH 1.38 09/19/2017   INR 0.95 04/08/2017   HGBA1C 6.4 09/19/2017   MICROALBUR 1.0 09/19/2017    Mr Brain Wo Contrast  Addendum Date: 05/02/2017   ADDENDUM REPORT: 05/02/2017 21:49 ADDENDUM: Though initial study nodes states the patient presented with RIGHT-sided headache, requested addendum states the patient's headaches are LEFT-sided.  Electronically Signed   By: Elon Alas M.D.   On: 05/02/2017 21:49   Result Date: 05/02/2017 CLINICAL DATA:  5 minute episode of RIGHT-sided headache and word-finding difficulties. History of TIA, diabetes, hypertension, breast cancer, hypercholesterolemia. EXAM: MRI HEAD WITHOUT CONTRAST MRA HEAD WITHOUT CONTRAST TECHNIQUE: Multiplanar, multiecho pulse sequences of the brain and surrounding structures were obtained without intravenous contrast. Angiographic images of the head  were obtained using MRA technique without contrast. COMPARISON:  CT HEAD Apr 16, 2017 at 0851 hours and MRI/MRA head December 25, 2006 FINDINGS: MRI HEAD FINDINGS BRAIN: No reduced diffusion to suggest acute ischemia. No susceptibility artifact to suggest hemorrhage. The ventricles and sulci are normal for patient's age. A few nonspecific punctate supratentorial white matter FLAIR T2 hyperintensities, less than expected for age. No suspicious parenchymal signal, masses or mass effect. No abnormal extra-axial fluid collections. VASCULAR: Normal major intracranial vascular flow voids present at skull base. SKULL AND UPPER CERVICAL SPINE: No abnormal sellar expansion. No suspicious calvarial bone marrow signal. Craniocervical junction maintained. SINUSES/ORBITS: Lobulated LEFT maxillary sinus mucosal thickening. No paranasal sinus air-fluid levels. Mastoid air cells are well aerated. The included ocular globes and orbital contents are non-suspicious. OTHER: None. MRA HEAD FINDINGS- mild motion degraded examination. ANTERIOR CIRCULATION: Normal flow related enhancement of the included cervical, petrous, cavernous and supraclinoid internal carotid arteries. Patent anterior communicating artery. Normal flow related enhancement of the anterior and middle cerebral arteries, including distal segments, mild proximal middle cerebral artery luminal irregularity compatible with atherosclerosis. No large vessel occlusion, high-grade stenosis,  aneurysm. POSTERIOR CIRCULATION: LEFT vertebral artery is dominant. Basilar artery is patent, with normal flow related enhancement of the main branch vessels. Normal flow related enhancement of the posterior cerebral arteries, mild to moderate luminal irregularity compatible with atherosclerosis. Robust bilateral posterior communicating arteries present. No large vessel occlusion, high-grade stenosis, aneurysm. ANATOMIC VARIANTS: None. Source images and MIP images were reviewed. IMPRESSION: MRI HEAD: Negative noncontrast MRI of the head for age. MRA HEAD: No emergent large vessel occlusion or severe stenosis on this mildly motion degraded examination. Intracranial atherosclerosis resulting in mild-to-moderate stenoses posterior cerebral artery and to lesser extent middle cerebral arteries. Electronically Signed: By: Elon Alas M.D. On: 04/08/2017 13:44   US Carotid Bilateral (at [facility] Only)  Result Date: 04/08/2017 CLINICAL DATA:  TIA.  Hypertension, hyperlipidemia, diabetes. EXAM: BILATERAL CAROTID DUPLEX ULTRASOUND TECHNIQUE: Pearline Cables scale imaging, color Doppler and duplex ultrasound was performed of bilateral carotid and vertebral arteries in the neck. COMPARISON:  12/24/2006 TECHNIQUE: Quantification of carotid stenosis is based on velocity parameters that correlate the residual internal carotid diameter with NASCET-based stenosis levels, using the diameter of the distal internal carotid lumen as the denominator for stenosis measurement. The following velocity measurements were obtained: PEAK SYSTOLIC/END DIASTOLIC RIGHT ICA:                     109/34cm/sec CCA:                     009/23RA/QTM SYSTOLIC ICA/CCA RATIO:  2.26 DIASTOLIC ICA/CCA RATIO: 3.33 ECA:                     1.2cm/sec LEFT ICA:                     110/29cm/sec CCA:                     545/62BW/LSL SYSTOLIC ICA/CCA RATIO:  3.73 DIASTOLIC ICA/CCA RATIO: 4.28 ECA:                     117cm/sec FINDINGS: RIGHT CAROTID ARTERY: Mild  tortuosity. There is mild slightly irregular plaque in the distal common carotid artery. Circumferential partially calcified plaque in the carotid bulb extending into the proximal ICA. No high-grade stenosis. Normal waveforms and color Doppler signal. RIGHT VERTEBRAL  ARTERY:  Normal flow direction and waveform. LEFT CAROTID ARTERY: Mild eccentric partially calcified plaque in the carotid bulb extending to the ICA resulting in at least mild stenosis. Normal waveforms and color Doppler signal however. Distal ICA mildly tortuous. LEFT VERTEBRAL ARTERY: Normal flow direction and waveform. IMPRESSION: 1. Bilateral carotid bifurcation and proximal ICA plaque resulting in less than 50% diameter stenosis. 2.  Antegrade bilateral vertebral arterial flow. Electronically Signed   By: Lucrezia Europe M.D.   On: 04/08/2017 13:56   Mr Jodene Nam Head/brain YK Cm  Addendum Date: 05/02/2017   ADDENDUM REPORT: 05/02/2017 21:49 ADDENDUM: Though initial study nodes states the patient presented with RIGHT-sided headache, requested addendum states the patient's headaches are LEFT-sided. Electronically Signed   By: Elon Alas M.D.   On: 05/02/2017 21:49   Result Date: 05/02/2017 CLINICAL DATA:  5 minute episode of RIGHT-sided headache and word-finding difficulties. History of TIA, diabetes, hypertension, breast cancer, hypercholesterolemia. EXAM: MRI HEAD WITHOUT CONTRAST MRA HEAD WITHOUT CONTRAST TECHNIQUE: Multiplanar, multiecho pulse sequences of the brain and surrounding structures were obtained without intravenous contrast. Angiographic images of the head were obtained using MRA technique without contrast. COMPARISON:  CT HEAD Apr 16, 2017 at 0851 hours and MRI/MRA head December 25, 2006 FINDINGS: MRI HEAD FINDINGS BRAIN: No reduced diffusion to suggest acute ischemia. No susceptibility artifact to suggest hemorrhage. The ventricles and sulci are normal for patient's age. A few nonspecific punctate supratentorial white matter FLAIR  T2 hyperintensities, less than expected for age. No suspicious parenchymal signal, masses or mass effect. No abnormal extra-axial fluid collections. VASCULAR: Normal major intracranial vascular flow voids present at skull base. SKULL AND UPPER CERVICAL SPINE: No abnormal sellar expansion. No suspicious calvarial bone marrow signal. Craniocervical junction maintained. SINUSES/ORBITS: Lobulated LEFT maxillary sinus mucosal thickening. No paranasal sinus air-fluid levels. Mastoid air cells are well aerated. The included ocular globes and orbital contents are non-suspicious. OTHER: None. MRA HEAD FINDINGS- mild motion degraded examination. ANTERIOR CIRCULATION: Normal flow related enhancement of the included cervical, petrous, cavernous and supraclinoid internal carotid arteries. Patent anterior communicating artery. Normal flow related enhancement of the anterior and middle cerebral arteries, including distal segments, mild proximal middle cerebral artery luminal irregularity compatible with atherosclerosis. No large vessel occlusion, high-grade stenosis, aneurysm. POSTERIOR CIRCULATION: LEFT vertebral artery is dominant. Basilar artery is patent, with normal flow related enhancement of the main branch vessels. Normal flow related enhancement of the posterior cerebral arteries, mild to moderate luminal irregularity compatible with atherosclerosis. Robust bilateral posterior communicating arteries present. No large vessel occlusion, high-grade stenosis, aneurysm. ANATOMIC VARIANTS: None. Source images and MIP images were reviewed. IMPRESSION: MRI HEAD: Negative noncontrast MRI of the head for age. MRA HEAD: No emergent large vessel occlusion or severe stenosis on this mildly motion degraded examination. Intracranial atherosclerosis resulting in mild-to-moderate stenoses posterior cerebral artery and to lesser extent middle cerebral arteries. Electronically Signed: By: Elon Alas M.D. On: 04/08/2017 13:44   Ct  Head Code Stroke W/o Cm  Result Date: 04/08/2017 CLINICAL DATA:  Code stroke.  Headache, dizziness, history of TIA EXAM: CT HEAD WITHOUT CONTRAST TECHNIQUE: Contiguous axial images were obtained from the base of the skull through the vertex without intravenous contrast. COMPARISON:  None. FINDINGS: Brain: No evidence of acute infarction, hemorrhage, hydrocephalus, extra-axial collection or mass lesion/mass effect. Vascular: No hyperdense vessel or unexpected calcification. Skull: Normal. Negative for fracture or focal lesion. Sinuses/Orbits: No acute finding. Other: None. ASPECTS [facility] Stroke Program Early CT Score) - Ganglionic level infarction (caudate, lentiform  nuclei, internal capsule, insula, M1-M3 cortex): 7 - Supraganglionic infarction (M4-M6 cortex): 3 Total score (0-10 with 10 being normal): 10 IMPRESSION: 1. Negative for bleed or other acute intracranial process. Critical Value/emergent results were called by telephone at the time of interpretation on 04/08/2017 at 9:13 am to Dr. Charlotte Crumb , who verbally acknowledged these results. 2. ASPECTS is 10 Electronically Signed   By: Lucrezia Europe M.D.   On: 04/08/2017 09:13       Assessment & Plan:   Problem List Items Addressed This Visit    History of breast cancer    Mammogram 10/20/16 - Birads II.  Continues f/u at [facility].        Hypercholesterolemia    On crestor.  Low cholesterol diet and exercise.  Follow lipid panel and liver function tests.        Relevant Orders   Hepatic function panel   Lipid panel   Hypertension    Blood pressure under good control.  Continue same medication regimen.  Follow pressures.  Follow metabolic panel.        TIA (transient ischemic attack)    On plavix.  Saw neurology.  Doing well.  Continue risk factor modification.       Type 2 diabetes mellitus with neurological complications (HCC)    Discussed diet and exercise.  Low carb diet and exercise.  Follow met b and a1c.   Lab Results  Component  Value Date   HGBA1C 6.4 09/19/2017        Relevant Orders   Hemoglobin R1V   Basic metabolic panel    Other Visit Diagnoses    Screening for cervical cancer    -  Primary   Relevant Orders   Cytology - PAP   Pain of toe of right foot       S/p injury.  better now.  declines any further evaluation.    Leg skin lesion, right       Improved.  will notify me if does not resolve.         Einar Pheasant, MD

## 2017-09-20 NOTE — Patient Instructions (Signed)
prevnar - new pneumonia shot.  (this is the one that you need)

## 2017-09-23 ENCOUNTER — Encounter: Payer: Self-pay | Admitting: Internal Medicine

## 2017-09-23 NOTE — Assessment & Plan Note (Signed)
On plavix.  Saw neurology.  Doing well.  Continue risk factor modification.

## 2017-09-23 NOTE — Assessment & Plan Note (Signed)
Discussed diet and exercise.  Low carb diet and exercise.  Follow met b and a1c.   Lab Results  Component Value Date   HGBA1C 6.4 09/19/2017   

## 2017-09-23 NOTE — Assessment & Plan Note (Signed)
Mammogram 10/20/16 - Birads II.  Continues f/u at [facility].

## 2017-09-23 NOTE — Assessment & Plan Note (Signed)
Blood pressure under good control.  Continue same medication regimen.  Follow pressures.  Follow metabolic panel.   

## 2017-09-23 NOTE — Assessment & Plan Note (Signed)
On crestor.  Low cholesterol diet and exercise.  Follow lipid panel and liver function tests.   

## 2017-09-24 LAB — CYTOLOGY - PAP
Diagnosis: NEGATIVE
HPV: NOT DETECTED

## 2017-09-25 ENCOUNTER — Encounter: Payer: Self-pay | Admitting: Internal Medicine

## 2017-09-27 ENCOUNTER — Other Ambulatory Visit: Payer: Medicare HMO

## 2017-10-01 ENCOUNTER — Encounter: Payer: Medicare HMO | Admitting: Internal Medicine

## 2017-10-23 DIAGNOSIS — Z800 Family history of malignant neoplasm of digestive organs: Secondary | ICD-10-CM | POA: Diagnosis not present

## 2017-10-23 DIAGNOSIS — D509 Iron deficiency anemia, unspecified: Secondary | ICD-10-CM | POA: Diagnosis not present

## 2017-10-26 DIAGNOSIS — Z853 Personal history of malignant neoplasm of breast: Secondary | ICD-10-CM | POA: Diagnosis not present

## 2017-10-26 DIAGNOSIS — D0512 Intraductal carcinoma in situ of left breast: Secondary | ICD-10-CM | POA: Diagnosis not present

## 2017-10-26 DIAGNOSIS — C50912 Malignant neoplasm of unspecified site of left female breast: Secondary | ICD-10-CM | POA: Diagnosis not present

## 2017-10-26 DIAGNOSIS — Z170 Estrogen receptor positive status [ER+]: Secondary | ICD-10-CM | POA: Diagnosis not present

## 2017-11-02 ENCOUNTER — Encounter: Payer: Self-pay | Admitting: *Deleted

## 2017-11-05 ENCOUNTER — Encounter
Admission: RE | Disposition: A | Discharge status: Home / Self Care | Payer: Self-pay | Source: Ambulatory Visit | Attending: Gastroenterology

## 2017-11-05 ENCOUNTER — Ambulatory Visit
Admission: RE | Admit: 2017-11-05 | Discharge: 2017-11-05 | Disposition: A | Discharge status: Home / Self Care | Payer: Medicare HMO | Source: Ambulatory Visit | Attending: Gastroenterology | Admitting: Gastroenterology

## 2017-11-05 ENCOUNTER — Ambulatory Visit: Payer: Medicare HMO | Admitting: Anesthesiology

## 2017-11-05 ENCOUNTER — Encounter: Payer: Self-pay | Admitting: *Deleted

## 2017-11-05 DIAGNOSIS — K269 Duodenal ulcer, unspecified as acute or chronic, without hemorrhage or perforation: Secondary | ICD-10-CM | POA: Insufficient documentation

## 2017-11-05 DIAGNOSIS — Z7984 Long term (current) use of oral hypoglycemic drugs: Secondary | ICD-10-CM | POA: Insufficient documentation

## 2017-11-05 DIAGNOSIS — K2960 Other gastritis without bleeding: Secondary | ICD-10-CM | POA: Insufficient documentation

## 2017-11-05 DIAGNOSIS — K2980 Duodenitis without bleeding: Secondary | ICD-10-CM | POA: Diagnosis not present

## 2017-11-05 DIAGNOSIS — K2900 Acute gastritis without bleeding: Secondary | ICD-10-CM | POA: Diagnosis not present

## 2017-11-05 DIAGNOSIS — E1149 Type 2 diabetes mellitus with other diabetic neurological complication: Secondary | ICD-10-CM | POA: Diagnosis not present

## 2017-11-05 DIAGNOSIS — K5790 Diverticulosis of intestine, part unspecified, without perforation or abscess without bleeding: Secondary | ICD-10-CM | POA: Diagnosis not present

## 2017-11-05 DIAGNOSIS — E7800 Pure hypercholesterolemia, unspecified: Secondary | ICD-10-CM | POA: Insufficient documentation

## 2017-11-05 DIAGNOSIS — K449 Diaphragmatic hernia without obstruction or gangrene: Secondary | ICD-10-CM | POA: Diagnosis not present

## 2017-11-05 DIAGNOSIS — I10 Essential (primary) hypertension: Secondary | ICD-10-CM | POA: Diagnosis not present

## 2017-11-05 DIAGNOSIS — K2940 Chronic atrophic gastritis without bleeding: Secondary | ICD-10-CM | POA: Diagnosis not present

## 2017-11-05 DIAGNOSIS — E119 Type 2 diabetes mellitus without complications: Secondary | ICD-10-CM | POA: Insufficient documentation

## 2017-11-05 DIAGNOSIS — D509 Iron deficiency anemia, unspecified: Secondary | ICD-10-CM | POA: Insufficient documentation

## 2017-11-05 DIAGNOSIS — K219 Gastro-esophageal reflux disease without esophagitis: Secondary | ICD-10-CM | POA: Diagnosis not present

## 2017-11-05 DIAGNOSIS — K2210 Ulcer of esophagus without bleeding: Secondary | ICD-10-CM | POA: Diagnosis not present

## 2017-11-05 DIAGNOSIS — Z800 Family history of malignant neoplasm of digestive organs: Secondary | ICD-10-CM | POA: Diagnosis not present

## 2017-11-05 DIAGNOSIS — K208 Other esophagitis: Secondary | ICD-10-CM | POA: Diagnosis not present

## 2017-11-05 DIAGNOSIS — K2970 Gastritis, unspecified, without bleeding: Secondary | ICD-10-CM | POA: Diagnosis not present

## 2017-11-05 DIAGNOSIS — Z8673 Personal history of transient ischemic attack (TIA), and cerebral infarction without residual deficits: Secondary | ICD-10-CM | POA: Insufficient documentation

## 2017-11-05 DIAGNOSIS — Z7982 Long term (current) use of aspirin: Secondary | ICD-10-CM | POA: Insufficient documentation

## 2017-11-05 DIAGNOSIS — K6389 Other specified diseases of intestine: Secondary | ICD-10-CM | POA: Insufficient documentation

## 2017-11-05 DIAGNOSIS — K5730 Diverticulosis of large intestine without perforation or abscess without bleeding: Secondary | ICD-10-CM | POA: Insufficient documentation

## 2017-11-05 DIAGNOSIS — K635 Polyp of colon: Secondary | ICD-10-CM | POA: Diagnosis not present

## 2017-11-05 DIAGNOSIS — Z79899 Other long term (current) drug therapy: Secondary | ICD-10-CM | POA: Insufficient documentation

## 2017-11-05 DIAGNOSIS — Z7902 Long term (current) use of antithrombotics/antiplatelets: Secondary | ICD-10-CM | POA: Insufficient documentation

## 2017-11-05 DIAGNOSIS — K210 Gastro-esophageal reflux disease with esophagitis: Secondary | ICD-10-CM | POA: Diagnosis not present

## 2017-11-05 DIAGNOSIS — Z853 Personal history of malignant neoplasm of breast: Secondary | ICD-10-CM | POA: Diagnosis not present

## 2017-11-05 DIAGNOSIS — R194 Change in bowel habit: Secondary | ICD-10-CM | POA: Diagnosis not present

## 2017-11-05 HISTORY — PX: COLONOSCOPY WITH PROPOFOL: SHX5780

## 2017-11-05 HISTORY — DX: Angina pectoris, unspecified: I20.9

## 2017-11-05 HISTORY — PX: ESOPHAGOGASTRODUODENOSCOPY (EGD) WITH PROPOFOL: SHX5813

## 2017-11-05 LAB — GLUCOSE, CAPILLARY: GLUCOSE-CAPILLARY: 81 mg/dL (ref 65–99)

## 2017-11-05 SURGERY — COLONOSCOPY WITH PROPOFOL
Anesthesia: General

## 2017-11-05 MED ORDER — PROPOFOL 10 MG/ML IV BOLUS
INTRAVENOUS | Status: AC
  Filled 2017-11-05: qty 20

## 2017-11-05 MED ORDER — SODIUM CHLORIDE 0.9 % IV SOLN: INTRAVENOUS | Status: DC

## 2017-11-05 MED ORDER — LIDOCAINE HCL (CARDIAC) 20 MG/ML IV SOLN
INTRAVENOUS | Status: DC | PRN
  Administered 2017-11-05: 100 mg via INTRAVENOUS

## 2017-11-05 MED ORDER — SODIUM CHLORIDE 0.9 % IV SOLN
INTRAVENOUS | Status: DC
  Administered 2017-11-05: 08:00:00 via INTRAVENOUS

## 2017-11-05 MED ORDER — PROPOFOL 10 MG/ML IV BOLUS
INTRAVENOUS | Status: DC | PRN
  Administered 2017-11-05: 70 mg via INTRAVENOUS

## 2017-11-05 MED ORDER — PROPOFOL 500 MG/50ML IV EMUL
INTRAVENOUS | Status: AC
  Filled 2017-11-05: qty 50

## 2017-11-05 MED ORDER — PROPOFOL 500 MG/50ML IV EMUL
INTRAVENOUS | Status: DC | PRN
  Administered 2017-11-05: 150 ug/kg/min via INTRAVENOUS

## 2017-11-05 NOTE — Op Note (Signed)
[facility] Patient Name: Angel Floyd Procedure Date: 11/05/2017 7:23 AM MRN: [id number] Account #: [id number] Date of Birth: 14-Apr-1951 Admit Type: Outpatient Age: 66 Room: [facility] ENDO ROOM 1 Gender: Female Note Status: Finalized Procedure:            Upper GI endoscopy Indications:          Iron deficiency anemia Providers:            Lollie Sails, MD Medicines:            Monitored Anesthesia Care Complications:        No immediate complications. Procedure:            Pre-Anesthesia Assessment:                       - ASA Grade Assessment: III - A patient with severe                        systemic disease.                       After obtaining informed consent, the endoscope was                        passed under direct vision. Throughout the procedure,                        the patient's blood pressure, pulse, and oxygen                        saturations were monitored continuously. The Endoscope                        was introduced through the mouth, and advanced to the                        third part of duodenum. The upper GI endoscopy was                        accomplished without difficulty. The patient tolerated                        the procedure well. Findings:      LA Grade A (one or more mucosal breaks less than 5 mm, not extending       between tops of 2 mucosal folds) esophagitis with no bleeding was found.       Biopsies were taken with a cold forceps for histology.      Scattered and patchy minimal inflammation characterized by adherent       blood and congestion (edema) was found in the gastric body. Biopsies       were taken with a cold forceps for histology. Biopsies were taken with a       cold forceps for Helicobacter pylori testing.      A few erosions without bleeding were found in the duodenal bulb and in       the second portion of the duodenum.      One non-bleeding superficial duodenal ulcer with  pigmented material was       found in the duodenal bulb. The lesion was 1 mm in largest dimension.      The  exam of the duodenum was otherwise normal.      A small hiatal hernia was found. The Z-line was a variable distance from       incisors; the hiatal hernia was sliding. Impression:           - LA Grade A erosive esophagitis. Biopsied.                       - Erosive gastritis. Biopsied.                       - Duodenal erosions without bleeding.                       - One non-bleeding duodenal ulcer with pigmented                        material. Recommendation:       - Use Protonix (pantoprazole) 40 mg PO BID for 1 month.                       - Use Protonix (pantoprazole) 40 mg PO daily daily.                       - Await pathology results.                       - Return to GI clinic in 4 weeks. Procedure Code(s):    --- Professional ---                       [phone], Esophagogastroduodenoscopy, flexible, transoral;                        with biopsy, single or multiple Diagnosis Code(s):    --- Professional ---                       K20.8, Other esophagitis                       K29.60, Other gastritis without bleeding                       K26.9, Duodenal ulcer, unspecified as acute or chronic,                        without hemorrhage or perforation                       D50.9, Iron deficiency anemia, unspecified CPT copyright 2016 [employer] Medical [employer]. All rights reserved. The codes documented in this report are preliminary and upon coder review may  be revised to meet current compliance requirements. Lollie Sails, MD 11/05/2017 8:19:15 AM This report has been signed electronically. Number of Addenda: 0 Note Initiated On: 11/05/2017 7:23 AM      [facility]

## 2017-11-05 NOTE — Anesthesia Post-op Follow-up Note (Signed)
Anesthesia QCDR form completed.        

## 2017-11-05 NOTE — Op Note (Signed)
[facility] Patient Name: Angel Floyd Procedure Date: 11/05/2017 7:21 AM MRN: [id number] Account #: [id number] Date of Birth: [birth year] Admit Type: Outpatient Age: 66 Room: [facility] ENDO ROOM 1 Gender: Female Note Status: Finalized Procedure:            Colonoscopy Indications:          Iron deficiency anemia, Change in bowel habits Providers:            Lollie Sails, MD Medicines:            Monitored Anesthesia Care Complications:        No immediate complications. Procedure:            Pre-Anesthesia Assessment:                       - ASA Grade Assessment: III - A patient with severe                        systemic disease.                       After obtaining informed consent, the colonoscope was                        passed under direct vision. Throughout the procedure,                        the patient's blood pressure, pulse, and oxygen                        saturations were monitored continuously. The                        Colonoscope was introduced through the anus and                        advanced to the the cecum, identified by appendiceal                        orifice and ileocecal valve. The colonoscopy was                        performed with moderate difficulty due to a tortuous                        colon. Successful completion of the procedure was aided                        by changing the patient to a supine position, changing                        the patient to a prone position and using manual                        pressure. The patient tolerated the procedure well. The                        quality of the bowel preparation was fair. Findings:      A 4 mm polyp/atypical mucosa was found in the ileocecal valve. The polyp  was sessile. The polyp was removed with a cold biopsy forceps. Resection       and retrieval were complete.      A few small-mouthed diverticula were found in the sigmoid colon.   The retroflexed view of the distal rectum and anal verge was normal and       showed no anal or rectal abnormalities.      The digital rectal exam was normal. Pertinent negatives include note       poor tone.      The exam was otherwise without abnormality. Impression:           - Preparation of the colon was fair.                       - One 4 mm polyp at the ileocecal valve, removed with a                        cold biopsy forceps. Resected and retrieved.                       - Diverticulosis in the sigmoid colon.                       - The distal rectum and anal verge are normal on                        retroflexion view.                       - The examination was otherwise normal. Recommendation:       - Discharge patient to home.                       - Await pathology results.                       - Return to GI clinic in 1 month.                       - if recurrent symptoms, consider SBS/VCE. Procedure Code(s):    --- Professional ---                       [phone], Colonoscopy, flexible; with biopsy, single or                        multiple Diagnosis Code(s):    --- Professional ---                       D12.0, Benign neoplasm of cecum                       D50.9, Iron deficiency anemia, unspecified                       R19.4, Change in bowel habit                       K57.30, Diverticulosis of large intestine without                        perforation or abscess without bleeding CPT copyright 2016 American Medical Association.  All rights reserved. The codes documented in this report are preliminary and upon coder review may  be revised to meet current compliance requirements. Lollie Sails, MD 11/05/2017 9:05:58 AM This report has been signed electronically. Number of Addenda: 0 Note Initiated On: 11/05/2017 7:21 AM Scope Withdrawal Time: 0 hours 11 minutes 43 seconds  Total Procedure Duration: 0 hours 39 minutes 55 seconds       [facility]

## 2017-11-05 NOTE — Anesthesia Procedure Notes (Signed)
Date/Time: 11/05/2017 7:54 AM Performed by: Nelda Marseille, CRNA Pre-anesthesia Checklist: Patient identified, Emergency Drugs available, Suction available, Patient being monitored and Timeout performed Oxygen Delivery Method: Nasal cannula

## 2017-11-05 NOTE — Addendum Note (Signed)
Addendum  created 11/05/17 1000 by Nelda Marseille, CRNA   Intraprocedure Meds edited

## 2017-11-05 NOTE — Transfer of Care (Signed)
Immediate Anesthesia Transfer of Care Note  Patient: Angel Floyd  Procedure(s) Performed: COLONOSCOPY WITH PROPOFOL (N/A ) ESOPHAGOGASTRODUODENOSCOPY (EGD) WITH PROPOFOL (N/A )  Patient Location: PACU  Anesthesia Type:General  Level of Consciousness: awake and sedated  Airway & Oxygen Therapy: Patient Spontanous Breathing and Patient connected to nasal cannula oxygen  Post-op Assessment: Report given to RN and Post -op Vital signs reviewed and stable  Post vital signs: Reviewed and stable  Last Vitals:  Vitals:   11/05/17 0721 11/05/17 0906  BP: 140/69 121/65  Pulse: 65 81  Resp: 20 20  Temp: 36.8 C (!) 35.7 C  SpO2: 99% 99%    Last Pain:  Vitals:   11/05/17 0906  TempSrc: Tympanic         Complications: No apparent anesthesia complications

## 2017-11-05 NOTE — Anesthesia Preprocedure Evaluation (Signed)
Anesthesia Evaluation  Patient identified by MRN, date of birth, ID band Patient awake    Reviewed: Allergy & Precautions, H&P , NPO status , Patient's Chart, lab work & pertinent test results, reviewed documented beta blocker date and time   Airway Mallampati: II   Neck ROM: full    Dental  (+) Poor Dentition, Teeth Intact   Pulmonary neg pulmonary ROS,    Pulmonary exam normal        Cardiovascular Exercise Tolerance: Good hypertension, On Medications + angina with exertion negative cardio ROS Normal cardiovascular exam Rhythm:regular Rate:Normal     Neuro/Psych TIANo Residual Symptoms negative neurological ROS  negative psych ROS   GI/Hepatic negative GI ROS, Neg liver ROS,   Endo/Other  negative endocrine ROSdiabetes  Renal/GU negative Renal ROS  negative genitourinary   Musculoskeletal   Abdominal   Peds  Hematology negative hematology ROS (+) anemia ,   Anesthesia Other Findings Past Medical History: No date: Anemia No date: Anginal pain (HCC) No date: Breast cancer ([place]) No date: Diabetes mellitus ([place]) No date: History of abnormal Pap smear     Comment:  class III, required cryosurgery No date: Hypercholesterolemia No date: Hypertension No date: TIA (transient ischemic attack) Past Surgical History: 1989: BREAST CYST ASPIRATION No date: DILATION AND CURETTAGE OF UTERUS No date: FOOT SURGERY     Comment:  morton's neuroma removed - right foot 10/2009: FOOT SURGERY     Comment:  achilles tendon release with reconstructive surgery No date: GYNECOLOGIC CRYOSURGERY     Comment:  class III pap No date: HAND SURGERY     Comment:  cyst removed - left hand 7/09: hysteroscopy and D&C [id number]: KNEE ARTHROSCOPY; Left     Comment:  removed tear & resurfaced area 09/20/12: KNEE SURGERY     Comment:  torn meniscus, (Dr Leanor Kail) No date: TUBAL LIGATION BMI    Body Mass Index:  26.79 kg/m     Reproductive/Obstetrics negative OB ROS                             Anesthesia Physical Anesthesia Plan  ASA: III  Anesthesia Plan: General   Post-op Pain Management:    Induction:   PONV Risk Score and Plan:   Airway Management Planned:   Additional Equipment:   Intra-op Plan:   Post-operative Plan:   Informed Consent: I have reviewed the patients History and Physical, chart, labs and discussed the procedure including the risks, benefits and alternatives for the proposed anesthesia with the patient or authorized representative who has indicated his/her understanding and acceptance.   Dental Advisory Given  Plan Discussed with: CRNA  Anesthesia Plan Comments:         Anesthesia Quick Evaluation

## 2017-11-05 NOTE — Anesthesia Postprocedure Evaluation (Signed)
Anesthesia Post Note  Patient: Angel Floyd  Procedure(s) Performed: COLONOSCOPY WITH PROPOFOL (N/A ) ESOPHAGOGASTRODUODENOSCOPY (EGD) WITH PROPOFOL (N/A )  Patient location during evaluation: PACU Anesthesia Type: General Level of consciousness: awake and alert Pain management: pain level controlled Vital Signs Assessment: post-procedure vital signs reviewed and stable Respiratory status: spontaneous breathing, nonlabored ventilation, respiratory function stable and patient connected to nasal cannula oxygen Cardiovascular status: blood pressure returned to baseline and stable Postop Assessment: no apparent nausea or vomiting Anesthetic complications: no     Last Vitals:  Vitals:   11/05/17 0721 11/05/17 0906  BP: 140/69 121/65  Pulse: 65 81  Resp: 20 20  Temp: 36.8 C (!) 35.7 C  SpO2: 99% 99%    Last Pain:  Vitals:   11/05/17 0906  TempSrc: Tympanic                 Molli Barrows

## 2017-11-05 NOTE — H&P (Signed)
Outpatient short stay form Pre-procedure 11/05/2017 7:40 AM Angel Sails MD  Primary Physician: Dr. Einar Pheasant  Reason for visit:  EGD and colonoscopy  History of present illness:  Patient is a 66 year old female presenting today as above. She has been found to be iron deficient. She has not been seeing blood in her stool. She does have a history of breast cancer in 2012. She tolerated her prep well. She does take both Plavix and minidose aspirin however has held Plavix for 5-6 days, she is also held her aspirin.    Current Facility-Administered Medications:  .  0.9 %  sodium chloride infusion, , Intravenous, Continuous, Angel Sails, MD .  0.9 %  sodium chloride infusion, , Intravenous, Continuous, Angel Sails, MD  Medications Prior to Admission  Medication Sig Dispense Refill Last Dose  . telmisartan (MICARDIS) 20 MG tablet Take 1 tablet (20 mg total) by mouth daily. 90 tablet 1 11/05/2017 at Unknown time  . aspirin 81 MG tablet Take 81 mg by mouth daily.   Taking  . clopidogrel (PLAVIX) 75 MG tablet Take 1 tablet (75 mg total) by mouth daily. 90 tablet 1 10/31/2017  . Fe Fum-FePoly-Vit C-Vit B3 (INTEGRA) 62.5-62.5-40-3 MG CAPS Take 1 capsule by mouth daily. 90 capsule 1 Taking  . fish oil-omega-3 fatty acids 1000 MG capsule Take 2 g by mouth daily.    Taking  . glucose blood test strip Use as instructed to check blood sugars three times daily. Has One Touch Ultra glucometer. Dx 250.00 100 each 5 Taking  . metFORMIN (GLUCOPHAGE) 500 MG tablet Take 1 tablet (500 mg total) by mouth daily. 90 tablet 1 Taking  . ranitidine (ZANTAC) 150 MG tablet Take 1 tablet (150 mg total) by mouth daily. 90 tablet 1 Taking  . rosuvastatin (CRESTOR) 10 MG tablet Take 1 tablet (10 mg total) by mouth daily. 90 tablet 1 Taking     Allergies  Allergen Reactions  . Aggrenox [Aspirin-Dipyridamole Er]      Past Medical History:  Diagnosis Date  . Anemia   . Anginal pain ([place])   .  Breast cancer ([place])   . Diabetes mellitus ([place])   . History of abnormal Pap smear    class III, required cryosurgery  . Hypercholesterolemia   . Hypertension   . TIA (transient ischemic attack)     Review of systems:      Physical Exam    Heart and lungs: Regular rate and rhythm without rub or gallop, lungs are bilaterally clear.    HEENT: Normocephalic atraumatic eyes are anicteric    Other:     Pertinant exam for procedure: Soft nontender nondistended bowel sounds positive normoactive    Planned proceedures: EGD, colonoscopy and indicated procedures. I have discussed the risks benefits and complications of procedures to include not limited to bleeding, infection, perforation and the risk of sedation and the patient wishes to proceed.    Angel Sails, MD Gastroenterology 11/05/2017  7:40 AM

## 2017-11-06 ENCOUNTER — Encounter: Payer: Self-pay | Admitting: Gastroenterology

## 2017-11-06 LAB — SURGICAL PATHOLOGY

## 2017-11-07 ENCOUNTER — Encounter: Payer: Self-pay | Admitting: Internal Medicine

## 2017-11-09 ENCOUNTER — Other Ambulatory Visit: Payer: Self-pay | Admitting: Internal Medicine

## 2017-12-07 DIAGNOSIS — D509 Iron deficiency anemia, unspecified: Secondary | ICD-10-CM | POA: Diagnosis not present

## 2017-12-11 DIAGNOSIS — K210 Gastro-esophageal reflux disease with esophagitis: Secondary | ICD-10-CM | POA: Diagnosis not present

## 2017-12-11 DIAGNOSIS — D500 Iron deficiency anemia secondary to blood loss (chronic): Secondary | ICD-10-CM | POA: Diagnosis not present

## 2017-12-11 DIAGNOSIS — K269 Duodenal ulcer, unspecified as acute or chronic, without hemorrhage or perforation: Secondary | ICD-10-CM | POA: Diagnosis not present

## 2017-12-17 DIAGNOSIS — D500 Iron deficiency anemia secondary to blood loss (chronic): Secondary | ICD-10-CM | POA: Diagnosis not present

## 2018-01-21 ENCOUNTER — Other Ambulatory Visit (INDEPENDENT_AMBULATORY_CARE_PROVIDER_SITE_OTHER): Payer: Medicare HMO

## 2018-01-21 ENCOUNTER — Other Ambulatory Visit: Payer: Medicare HMO

## 2018-01-21 ENCOUNTER — Encounter: Payer: Self-pay | Admitting: Internal Medicine

## 2018-01-21 DIAGNOSIS — E1149 Type 2 diabetes mellitus with other diabetic neurological complication: Secondary | ICD-10-CM

## 2018-01-21 DIAGNOSIS — E7800 Pure hypercholesterolemia, unspecified: Secondary | ICD-10-CM

## 2018-01-21 LAB — LIPID PANEL
Cholesterol: 167 mg/dL (ref 0–200)
HDL: 65.5 mg/dL (ref >39.00)
LDL Cholesterol: 89 mg/dL (ref 0–99)
NONHDL: 101.22
Total CHOL/HDL Ratio: 3
Triglycerides: 60 mg/dL (ref 0.0–149.0)
VLDL: 12 mg/dL (ref 0.0–40.0)

## 2018-01-21 LAB — HEPATIC FUNCTION PANEL
ALBUMIN: 4 g/dL (ref 3.5–5.2)
ALK PHOS: 64 U/L (ref 39–117)
ALT: 14 U/L (ref 0–35)
AST: 13 U/L (ref 0–37)
Bilirubin, Direct: 0.1 mg/dL (ref 0.0–0.3)
Total Bilirubin: 0.7 mg/dL (ref 0.2–1.2)
Total Protein: 6.6 g/dL (ref 6.0–8.3)

## 2018-01-21 LAB — BASIC METABOLIC PANEL
BUN: 20 mg/dL (ref 6–23)
CALCIUM: 9.4 mg/dL (ref 8.4–10.5)
CO2: 30 mEq/L (ref 19–32)
Chloride: 105 mEq/L (ref 96–112)
Creatinine, Ser: 0.72 mg/dL (ref 0.40–1.20)
GFR: 86.16 mL/min (ref >60.00)
GLUCOSE: 113 mg/dL — AB (ref 70–99)
Potassium: 4.3 mEq/L (ref 3.5–5.1)
SODIUM: 142 meq/L (ref 135–145)

## 2018-01-21 LAB — HEMOGLOBIN A1C: Hgb A1c MFr Bld: 6.6 % — ABNORMAL HIGH (ref 4.6–6.5)

## 2018-01-23 ENCOUNTER — Ambulatory Visit: Payer: Medicare HMO | Admitting: Internal Medicine

## 2018-01-23 ENCOUNTER — Encounter: Payer: Self-pay | Admitting: Internal Medicine

## 2018-01-23 DIAGNOSIS — D508 Other iron deficiency anemias: Secondary | ICD-10-CM

## 2018-01-23 DIAGNOSIS — Z853 Personal history of malignant neoplasm of breast: Secondary | ICD-10-CM | POA: Diagnosis not present

## 2018-01-23 DIAGNOSIS — E7800 Pure hypercholesterolemia, unspecified: Secondary | ICD-10-CM

## 2018-01-23 DIAGNOSIS — G459 Transient cerebral ischemic attack, unspecified: Secondary | ICD-10-CM | POA: Diagnosis not present

## 2018-01-23 DIAGNOSIS — E1149 Type 2 diabetes mellitus with other diabetic neurological complication: Secondary | ICD-10-CM

## 2018-01-23 DIAGNOSIS — I10 Essential (primary) hypertension: Secondary | ICD-10-CM | POA: Diagnosis not present

## 2018-01-23 LAB — HM DIABETES FOOT EXAM

## 2018-01-23 MED ORDER — ROSUVASTATIN CALCIUM 10 MG PO TABS: 10.0000 mg | ORAL_TABLET | Freq: Every day | ORAL | 1 refills | Status: DC

## 2018-01-23 MED ORDER — GLUCOSE BLOOD VI STRP: ORAL_STRIP | 2 refills | Status: DC

## 2018-01-23 MED ORDER — METFORMIN HCL 500 MG PO TABS: 500.0000 mg | ORAL_TABLET | Freq: Every day | ORAL | 1 refills | Status: DC

## 2018-01-23 MED ORDER — TELMISARTAN 20 MG PO TABS: 20.0000 mg | ORAL_TABLET | Freq: Every day | ORAL | 1 refills | Status: DC

## 2018-01-23 MED ORDER — CLOPIDOGREL BISULFATE 75 MG PO TABS: 75.0000 mg | ORAL_TABLET | Freq: Every day | ORAL | 0 refills | Status: DC

## 2018-01-23 NOTE — Patient Instructions (Signed)
prevnar (newest pneumonia vaccine)

## 2018-01-23 NOTE — Progress Notes (Signed)
Patient ID: Angel Floyd, female   DOB: 1951-07-09, 67 y.o.   MRN: [id number]   Subjective:    Patient ID: Angel Floyd, female    DOB: 07/26/1951, 67 y.o.   MRN: [id number]  HPI  Patient here for a scheduled follow up.  States she is doing well.  Feels good. Not exercising as much.  Plans to get more active when it gets warmer.  No chest pain.  No sob.  No acid reflux.  No abdominal pain.  Bowels moving.  Just had w/up for IDA.  See GI's note.  Taking iron.  Recent hemoccult cards negative.  Recommended f/u in 6 months.  Discussed recent labs.     Past Medical History:  Diagnosis Date  . Anemia   . Anginal pain ([place])   . Breast cancer ([place])   . Diabetes mellitus ([place])   . History of abnormal Pap smear    class III, required cryosurgery  . Hypercholesterolemia   . Hypertension   . TIA (transient ischemic attack)    Past Surgical History:  Procedure Laterality Date  . BREAST CYST ASPIRATION  1989  . COLONOSCOPY WITH PROPOFOL N/A 11/05/2017   Procedure: COLONOSCOPY WITH PROPOFOL;  Surgeon: Lollie Sails, MD;  Location: [facility] ENDOSCOPY;  Service: Endoscopy;  Laterality: N/A;  . DILATION AND CURETTAGE OF UTERUS    . ESOPHAGOGASTRODUODENOSCOPY (EGD) WITH PROPOFOL N/A 11/05/2017   Procedure: ESOPHAGOGASTRODUODENOSCOPY (EGD) WITH PROPOFOL;  Surgeon: Lollie Sails, MD;  Location: [facility] ENDOSCOPY;  Service: Endoscopy;  Laterality: N/A;  . FOOT SURGERY     morton's neuroma removed - right foot  . FOOT SURGERY  10/2009   achilles tendon release with reconstructive surgery  . GYNECOLOGIC CRYOSURGERY     class III pap  . HAND SURGERY     cyst removed - left hand  . hysteroscopy and D&C  7/09  . KNEE ARTHROSCOPY Left [id number]   removed tear & resurfaced area  . KNEE SURGERY  09/20/12   torn meniscus, (Dr Leanor Kail)  . TUBAL LIGATION     Family History  Problem Relation Age of Onset  . Lung cancer Father   . Alcohol abuse Father   . Cancer Father        lung  . CVA  Mother   . Colon cancer Mother   . Alcohol abuse Mother   . Cancer Mother        colon  . Stroke Mother   . Colon cancer Sister   . Diabetes Maternal Grandfather    Social History   Socioeconomic History  . Marital status: Married    Spouse name: None  . Number of children: 2  . Years of education: None  . Highest education level: None  Social Needs  . Financial resource strain: None  . Food insecurity - worry: None  . Food insecurity - inability: None  . Transportation needs - medical: None  . Transportation needs - non-medical: None  Occupational History  . None  Tobacco Use  . Smoking status: Never Smoker  . Smokeless tobacco: Never Used  Substance and Sexual Activity  . Alcohol use: No    Alcohol/week: 0.0 oz  . Drug use: No  . Sexual activity: None  Other Topics Concern  . None  Social History Narrative  . None    Outpatient Encounter Medications as of 01/23/2018  Medication Sig  . aspirin 81 MG tablet Take 81 mg by mouth daily.  . clopidogrel (PLAVIX) 75  MG tablet Take 1 tablet (75 mg total) by mouth daily.  . Fe Fum-FePoly-Vit C-Vit B3 (INTEGRA) 62.5-62.5-40-3 MG CAPS Take 1 capsule by mouth daily.  . fish oil-omega-3 fatty acids 1000 MG capsule Take 2 g by mouth daily.   Marland Kitchen glucose blood test strip Use as instructed to check blood sugars three times daily. Has One Touch Ultra glucometer. Dx 250.00  . metFORMIN (GLUCOPHAGE) 500 MG tablet Take 1 tablet (500 mg total) by mouth daily.  . rosuvastatin (CRESTOR) 10 MG tablet Take 1 tablet (10 mg total) by mouth daily.  Marland Kitchen telmisartan (MICARDIS) 20 MG tablet Take 1 tablet (20 mg total) by mouth daily.  . [DISCONTINUED] clopidogrel (PLAVIX) 75 MG tablet TAKE 1 TABLET DAILY  . [DISCONTINUED] glucose blood test strip Use as instructed to check blood sugars three times daily. Has One Touch Ultra glucometer. Dx 250.00  . [DISCONTINUED] metFORMIN (GLUCOPHAGE) 500 MG tablet Take 1 tablet (500 mg total) by mouth daily.  .  [DISCONTINUED] rosuvastatin (CRESTOR) 10 MG tablet Take 1 tablet (10 mg total) by mouth daily.  . [DISCONTINUED] telmisartan (MICARDIS) 20 MG tablet Take 1 tablet (20 mg total) by mouth daily.  . pantoprazole (PROTONIX) 40 MG tablet   . ranitidine (ZANTAC) 150 MG tablet Take 1 tablet (150 mg total) by mouth daily. (Patient not taking: Reported on 01/23/2018)   No facility-administered encounter medications on file as of 01/23/2018.     Review of Systems  Constitutional: Negative for appetite change and unexpected weight change.  HENT: Negative for congestion and sinus pressure.   Respiratory: Negative for cough, chest tightness and shortness of breath.   Cardiovascular: Negative for chest pain, palpitations and leg swelling.  Gastrointestinal: Negative for abdominal pain, diarrhea, nausea and vomiting.  Genitourinary: Negative for difficulty urinating and dysuria.  Musculoskeletal: Negative for joint swelling and myalgias.  Skin: Negative for color change and rash.  Neurological: Negative for dizziness, light-headedness and headaches.  Psychiatric/Behavioral: Negative for agitation and dysphoric mood.       Objective:     Blood pressure rechecked by me:  130/78  Physical Exam  Constitutional: She appears well-developed and well-nourished. No distress.  HENT:  Nose: Nose normal.  Mouth/Throat: Oropharynx is clear and moist.  Neck: Neck supple. No thyromegaly present.  Cardiovascular: Normal rate and regular rhythm.  1/6 systolic murmur.   Pulmonary/Chest: Breath sounds normal. No respiratory distress. She has no wheezes.  Abdominal: Soft. Bowel sounds are normal. There is no tenderness.  Musculoskeletal: She exhibits no edema or tenderness.  Feet:  No lesions.  Intact to light touch and pin prick.  DP pulses palpable and equal bilaterally.   Lymphadenopathy:    She has no cervical adenopathy.  Skin: No rash noted. No erythema.  Psychiatric: She has a normal mood and affect. Her  behavior is normal.    BP 130/78   Pulse 77   Temp 97.9 F (36.6 C) (Oral)   Resp 18   Wt 166 lb 9.6 oz (75.6 kg)   SpO2 97%   BMI 27.72 kg/m  Wt Readings from Last 3 Encounters:  01/23/18 166 lb 9.6 oz (75.6 kg)  11/05/17 161 lb (73 kg)  09/20/17 162 lb 9.6 oz (73.8 kg)     Lab Results  Component Value Date   WBC 5.2 04/08/2017   HGB 14.2 04/08/2017   HCT 40.9 04/08/2017   PLT 192 04/08/2017   GLUCOSE 113 (H) 01/21/2018   CHOL 167 01/21/2018   TRIG 60.0 01/21/2018  HDL 65.50 01/21/2018   LDLCALC 89 01/21/2018   ALT 14 01/21/2018   AST 13 01/21/2018   NA 142 01/21/2018   K 4.3 01/21/2018   CL 105 01/21/2018   CREATININE 0.72 01/21/2018   BUN 20 01/21/2018   CO2 30 01/21/2018   TSH 1.38 09/19/2017   INR 0.95 04/08/2017   HGBA1C 6.6 (H) 01/21/2018   MICROALBUR 1.0 09/19/2017       Assessment & Plan:   Problem List Items Addressed This Visit    Anemia    Iron deficient.  On iron supplements.  Just had GI w/up 10/2017.  Follow cbc.        History of breast cancer    10/26/17 - mammogram ok.        Hypercholesterolemia    On crestor.  Low cholesterol diet and exercise.  Follow lipid panel and liver function tests.        Relevant Medications   rosuvastatin (CRESTOR) 10 MG tablet   telmisartan (MICARDIS) 20 MG tablet   Hypertension    Blood pressure on recheck wnl.  Has been under good control.  Have her spot check her pressure.  Follow metabolic panel.  Continue current medication regimen.        Relevant Medications   rosuvastatin (CRESTOR) 10 MG tablet   telmisartan (MICARDIS) 20 MG tablet   TIA (transient ischemic attack)    On plavix.  Doing well.  Evaluated by neurology.        Relevant Medications   rosuvastatin (CRESTOR) 10 MG tablet   telmisartan (MICARDIS) 20 MG tablet   Type 2 diabetes mellitus with neurological complications ([place])    Discussed recent labs.  a1c 6.6.  Low carb diet and exercise.  Follow met b and a1c.         Relevant Medications   metFORMIN (GLUCOPHAGE) 500 MG tablet   rosuvastatin (CRESTOR) 10 MG tablet   telmisartan (MICARDIS) 20 MG tablet       Einar Pheasant, MD

## 2018-01-26 ENCOUNTER — Encounter: Payer: Self-pay | Admitting: Internal Medicine

## 2018-01-26 NOTE — Assessment & Plan Note (Signed)
On crestor.  Low cholesterol diet and exercise.  Follow lipid panel and liver function tests.   

## 2018-01-26 NOTE — Assessment & Plan Note (Signed)
On plavix.  Doing well.  Evaluated by neurology.

## 2018-01-26 NOTE — Assessment & Plan Note (Signed)
10/26/17 - mammogram ok.   

## 2018-01-26 NOTE — Assessment & Plan Note (Signed)
Discussed recent labs.  a1c 6.6.  Low carb diet and exercise.  Follow met b and a1c.

## 2018-01-26 NOTE — Assessment & Plan Note (Signed)
Iron deficient.  On iron supplements.  Just had GI w/up 10/2017.  Follow cbc.

## 2018-01-26 NOTE — Assessment & Plan Note (Signed)
Blood pressure on recheck wnl.  Has been under good control.  Have her spot check her pressure.  Follow metabolic panel.  Continue current medication regimen.

## 2018-01-29 ENCOUNTER — Encounter: Payer: Self-pay | Admitting: Internal Medicine

## 2018-01-30 ENCOUNTER — Other Ambulatory Visit: Payer: Self-pay

## 2018-02-08 DIAGNOSIS — R69 Illness, unspecified: Secondary | ICD-10-CM | POA: Diagnosis not present

## 2018-02-11 ENCOUNTER — Ambulatory Visit (INDEPENDENT_AMBULATORY_CARE_PROVIDER_SITE_OTHER): Payer: Medicare HMO | Admitting: Internal Medicine

## 2018-02-11 VITALS — BP 136/78 | HR 70 | Temp 98.3°F | Resp 18 | Ht 65.0 in | Wt 168.2 lb

## 2018-02-11 DIAGNOSIS — Z0000 Encounter for general adult medical examination without abnormal findings: Secondary | ICD-10-CM

## 2018-02-11 DIAGNOSIS — Z23 Encounter for immunization: Secondary | ICD-10-CM

## 2018-02-11 NOTE — Progress Notes (Signed)
Patient ID: Angel Floyd, female   DOB: 11-07-1951, 67 y.o.   MRN: [id number] The patient is here for her welcome to Medicare wellness examination and management of other chronic and acute problems.   The risk factors are reflected in the social history.  The roster of all physicians providing medical care to patient - followed by cardiology - Dr Jordan Hawks, ophthalmology - Dr Thomasene Ripple, gastroenterology and sees her dentist regularly.    Activities of daily living:  The patient is 100% independent in all ADLs: dressing, toileting, feeding as well as independent mobility  Home safety : The patient has smoke detectors in the home. She wears seatbelts.  There are firearms at home. There is no violence in the home.   There is no risks for hepatitis, STDs or HIV. There is no   history of blood transfusion. She has no travel history to infectious disease endemic areas of the world.  The patient has seen her dentist in the last six month (last visit 11/2017).   She has seen her eye doctor in the last year.   Discussed the need for sun protection: hats, long sleeves and use of sunscreen if there is significant sun exposure.   Diet: the importance of a healthy diet is discussed. She is trying to watch her diet.    The benefits of regular aerobic exercise were discussed. She plans to start exercising more.    Depression screen: there are no signs or vegative symptoms of depression- irritability, change in appetite, anhedonia, sadness/tearfullness.  Cognitive assessment: the patient manages all her financial and personal affairs and is actively engaged. She could relate day,date,year and events; recalled 3/3 objects at 3 minutes.    The following portions of the patient's history were reviewed and updated as appropriate: allergies, current medications, past family history, past medical history,  past surgical history, past social history  and problem list.  Visual acuity was not assessed per patient  preference since she has regular follow up with her ophthalmologist. Hearing and body mass index were assessed and reviewed.   During the course of the visit the patient was educated and counseled about appropriate screening and preventive services including : fall prevention , diabetes screening, nutrition counseling, colorectal cancer screening, and recommended immunizations.    She is up to date with colonoscopy and mammogram.  Has had shingrix.  Discussed need for prevnar.  Will check hepatitis C with next labs.    Discussed with her regarding living will and health care power of attorney.  She has living will and has appointed health care power of attorney.  Has discussed her wishes for end of life care.

## 2018-02-16 ENCOUNTER — Encounter: Payer: Self-pay | Admitting: Internal Medicine

## 2018-02-16 ENCOUNTER — Other Ambulatory Visit: Payer: Self-pay | Admitting: Internal Medicine

## 2018-02-16 DIAGNOSIS — E7800 Pure hypercholesterolemia, unspecified: Secondary | ICD-10-CM

## 2018-02-16 DIAGNOSIS — E1149 Type 2 diabetes mellitus with other diabetic neurological complication: Secondary | ICD-10-CM

## 2018-02-16 DIAGNOSIS — Z1159 Encounter for screening for other viral diseases: Secondary | ICD-10-CM

## 2018-02-16 DIAGNOSIS — I10 Essential (primary) hypertension: Secondary | ICD-10-CM

## 2018-02-16 NOTE — Progress Notes (Signed)
Order placed for hepatitis c screening

## 2018-02-16 NOTE — Progress Notes (Signed)
Orders placed for f/u labs.  

## 2018-02-18 ENCOUNTER — Ambulatory Visit: Payer: Medicare HMO | Admitting: Internal Medicine

## 2018-05-09 ENCOUNTER — Other Ambulatory Visit: Payer: Self-pay | Admitting: Internal Medicine

## 2018-08-12 ENCOUNTER — Other Ambulatory Visit (INDEPENDENT_AMBULATORY_CARE_PROVIDER_SITE_OTHER): Payer: Medicare HMO

## 2018-08-12 DIAGNOSIS — Z1159 Encounter for screening for other viral diseases: Secondary | ICD-10-CM

## 2018-08-12 DIAGNOSIS — E1149 Type 2 diabetes mellitus with other diabetic neurological complication: Secondary | ICD-10-CM

## 2018-08-12 DIAGNOSIS — E7800 Pure hypercholesterolemia, unspecified: Secondary | ICD-10-CM | POA: Diagnosis not present

## 2018-08-12 DIAGNOSIS — I10 Essential (primary) hypertension: Secondary | ICD-10-CM

## 2018-08-12 LAB — CBC WITH DIFFERENTIAL/PLATELET
BASOS ABS: 0 10*3/uL (ref 0.0–0.1)
Basophils Relative: 0.6 % (ref 0.0–3.0)
EOS PCT: 3.1 % (ref 0.0–5.0)
Eosinophils Absolute: 0.1 10*3/uL (ref 0.0–0.7)
HCT: 38.5 % (ref 36.0–46.0)
HEMOGLOBIN: 13.1 g/dL (ref 12.0–15.0)
Lymphocytes Relative: 29.6 % (ref 12.0–46.0)
Lymphs Abs: 1.4 10*3/uL (ref 0.7–4.0)
MCHC: 34 g/dL (ref 30.0–36.0)
MCV: 89.7 fl (ref 78.0–100.0)
MONOS PCT: 6.8 % (ref 3.0–12.0)
Monocytes Absolute: 0.3 10*3/uL (ref 0.1–1.0)
Neutro Abs: 2.8 10*3/uL (ref 1.4–7.7)
Neutrophils Relative %: 59.9 % (ref 43.0–77.0)
Platelets: 173 10*3/uL (ref 150.0–400.0)
RBC: 4.29 Mil/uL (ref 3.87–5.11)
RDW: 12.7 % (ref 11.5–15.5)
WBC: 4.7 10*3/uL (ref 4.0–10.5)

## 2018-08-12 LAB — BASIC METABOLIC PANEL
BUN: 17 mg/dL (ref 6–23)
CALCIUM: 9.3 mg/dL (ref 8.4–10.5)
CHLORIDE: 106 meq/L (ref 96–112)
CO2: 31 meq/L (ref 19–32)
CREATININE: 0.73 mg/dL (ref 0.40–1.20)
GFR: 84.66 mL/min (ref >60.00)
Glucose, Bld: 144 mg/dL — ABNORMAL HIGH (ref 70–99)
Potassium: 4.1 mEq/L (ref 3.5–5.1)
Sodium: 143 mEq/L (ref 135–145)

## 2018-08-12 LAB — HEPATIC FUNCTION PANEL
ALK PHOS: 61 U/L (ref 39–117)
ALT: 14 U/L (ref 0–35)
AST: 14 U/L (ref 0–37)
Albumin: 4.3 g/dL (ref 3.5–5.2)
BILIRUBIN DIRECT: 0.1 mg/dL (ref 0.0–0.3)
BILIRUBIN TOTAL: 0.6 mg/dL (ref 0.2–1.2)
TOTAL PROTEIN: 6.5 g/dL (ref 6.0–8.3)

## 2018-08-12 LAB — LIPID PANEL
CHOLESTEROL: 163 mg/dL (ref 0–200)
HDL: 57 mg/dL (ref >39.00)
LDL Cholesterol: 90 mg/dL (ref 0–99)
NonHDL: 105.71
TRIGLYCERIDES: 79 mg/dL (ref 0.0–149.0)
Total CHOL/HDL Ratio: 3
VLDL: 15.8 mg/dL (ref 0.0–40.0)

## 2018-08-12 LAB — HEMOGLOBIN A1C: HEMOGLOBIN A1C: 6.7 % — AB (ref 4.6–6.5)

## 2018-08-13 ENCOUNTER — Encounter: Payer: Self-pay | Admitting: Internal Medicine

## 2018-08-13 LAB — HEPATITIS C ANTIBODY
Hepatitis C Ab: NONREACTIVE
SIGNAL TO CUT-OFF: 0.01 (ref <1.00)

## 2018-08-14 ENCOUNTER — Ambulatory Visit (INDEPENDENT_AMBULATORY_CARE_PROVIDER_SITE_OTHER): Payer: Medicare HMO | Admitting: Internal Medicine

## 2018-08-14 ENCOUNTER — Encounter: Payer: Self-pay | Admitting: Internal Medicine

## 2018-08-14 VITALS — BP 130/70 | HR 63 | Temp 97.9°F | Resp 18 | Wt 163.4 lb

## 2018-08-14 DIAGNOSIS — I10 Essential (primary) hypertension: Secondary | ICD-10-CM

## 2018-08-14 DIAGNOSIS — Z0000 Encounter for general adult medical examination without abnormal findings: Secondary | ICD-10-CM | POA: Diagnosis not present

## 2018-08-14 DIAGNOSIS — E1149 Type 2 diabetes mellitus with other diabetic neurological complication: Secondary | ICD-10-CM

## 2018-08-14 DIAGNOSIS — D508 Other iron deficiency anemias: Secondary | ICD-10-CM

## 2018-08-14 DIAGNOSIS — R0989 Other specified symptoms and signs involving the circulatory and respiratory systems: Secondary | ICD-10-CM

## 2018-08-14 DIAGNOSIS — Z853 Personal history of malignant neoplasm of breast: Secondary | ICD-10-CM

## 2018-08-14 DIAGNOSIS — E7800 Pure hypercholesterolemia, unspecified: Secondary | ICD-10-CM

## 2018-08-14 LAB — FERRITIN: Ferritin: 78.2 ng/mL (ref 10.0–291.0)

## 2018-08-14 NOTE — Assessment & Plan Note (Addendum)
Physical today 08/14/18.  PAP 09/20/17 negative with negative HPV.  Colonoscopy 10/2017 - 4 mm polyp and diverticulosis.
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
## 2018-08-15 ENCOUNTER — Encounter: Payer: Self-pay | Admitting: Internal Medicine

## 2018-08-17 ENCOUNTER — Encounter: Payer: Self-pay | Admitting: Internal Medicine

## 2018-08-17 NOTE — Assessment & Plan Note (Signed)
On crestor.  Low cholesterol diet and exercise.  Follow lipid panel and liver function tests.   

## 2018-08-17 NOTE — Assessment & Plan Note (Signed)
Low carb diet and exercise.  Follow met b and a1c.   

## 2018-08-17 NOTE — Assessment & Plan Note (Signed)
Carotid ultrasound 03/2017 < 50%.   Continue risk factor modification.

## 2018-08-17 NOTE — Assessment & Plan Note (Signed)
Iron deficient.  Saw GI.  Had GI w/up as outlined.  On iron supplements.  Taking qod.  Recent hgb wnl.  Check ferritin.

## 2018-08-17 NOTE — Assessment & Plan Note (Signed)
Blood pressure under good control.  Continue same medication regimen.  Follow pressures.  Follow metabolic panel.   

## 2018-08-17 NOTE — Assessment & Plan Note (Signed)
10/26/17 - mammogram ok.

## 2018-09-05 ENCOUNTER — Other Ambulatory Visit: Payer: Self-pay | Admitting: Internal Medicine

## 2018-09-05 MED ORDER — TELMISARTAN 20 MG PO TABS: 20.0000 mg | ORAL_TABLET | Freq: Every day | ORAL | 1 refills | Status: DC

## 2018-09-05 MED ORDER — METFORMIN HCL 500 MG PO TABS: 500.0000 mg | ORAL_TABLET | Freq: Every day | ORAL | 1 refills | Status: DC

## 2018-09-05 MED ORDER — ROSUVASTATIN CALCIUM 10 MG PO TABS: 10.0000 mg | ORAL_TABLET | Freq: Every day | ORAL | 1 refills | Status: DC

## 2018-09-05 MED ORDER — CLOPIDOGREL BISULFATE 75 MG PO TABS: 75.0000 mg | ORAL_TABLET | Freq: Every day | ORAL | 1 refills | Status: DC

## 2018-09-05 NOTE — Telephone Encounter (Signed)
Requested Prescriptions  Pending Prescriptions Disp Refills  . clopidogrel (PLAVIX) 75 MG tablet 90 tablet 1    Sig: Take 1 tablet (75 mg total) by mouth daily.     Hematology: Antiplatelets - clopidogrel Failed - 09/05/2018  8:59 AM      Failed - Evaluate AST, ALT within 2 months of therapy initiation.      Passed - ALT in normal range and within 360 days    ALT  Date Value Ref Range Status  08/12/2018 14 0 - 35 U/L Final   SGPT (ALT)  Date Value Ref Range Status  12/04/2012 54 12 - 78 U/L Final         Passed - AST in normal range and within 360 days    AST  Date Value Ref Range Status  08/12/2018 14 0 - 37 U/L Final   SGOT(AST)  Date Value Ref Range Status  12/04/2012 23 15 - 37 Unit/L Final         Passed - HCT in normal range and within 180 days    HCT  Date Value Ref Range Status  08/12/2018 38.5 36.0 - 46.0 % Final  12/04/2012 45.9 35.0 - 47.0 % Final         Passed - HGB in normal range and within 180 days    Hemoglobin  Date Value Ref Range Status  08/12/2018 13.1 12.0 - 15.0 g/dL Final   HGB  Date Value Ref Range Status  12/04/2012 16.2 (H) 12.0 - 16.0 g/dL Final         Passed - PLT in normal range and within 180 days    Platelets  Date Value Ref Range Status  08/12/2018 173.0 150.0 - 400.0 K/uL Final   Platelet  Date Value Ref Range Status  12/04/2012 235 150 - 440 x10 3/mm 3 Final         Passed - Valid encounter within last 6 months    Recent Outpatient Visits          3 weeks ago Routine general medical examination at a health care facility   [facility] [place] Scott, Charlene, MD   6 months ago Need for vaccination with 13-polyvalent pneumococcal conjugate vaccine   [facility] [place] Scott, Charlene, MD   7 months ago Other iron deficiency anemia   [facility] [place] Scott, Charlene, MD   11 months ago Screening for cervical cancer   [facility] [place] Scott, Charlene, MD   1 year  ago Need for Tdap vaccination   [facility] [place] Scott, Charlene, MD      Future Appointments            In 3 months Scott, Charlene, MD [facility]         . metFORMIN (GLUCOPHAGE) 500 MG tablet 90 tablet 1    Sig: Take 1 tablet (500 mg total) by mouth daily.     Endocrinology:  Diabetes - Biguanides Passed - 09/05/2018  8:59 AM      Passed - Cr in normal range and within 360 days    Creatinine  Date Value Ref Range Status  12/04/2012 0.90 0.60 - 1.30 mg/dL Final   Creatinine, Ser  Date Value Ref Range Status  08/12/2018 0.73 0.40 - 1.20 mg/dL Final         Passed - HBA1C is between 0 and 7.9 and within 180 days    Hgb A1c MFr Bld  Date   Value Ref Range Status  08/12/2018 6.7 (H) 4.6 - 6.5 % Final    Comment:    Glycemic Control Guidelines for People with Diabetes:Non Diabetic:  <6%Goal of Therapy: <7%Additional Action Suggested:  >8%          Passed - eGFR in normal range and within 360 days    EGFR (African American)  Date Value Ref Range Status  12/04/2012 >60  Final   GFR calc Af Amer  Date Value Ref Range Status  04/08/2017 >60 >60 mL/min Final    Comment:    (NOTE) The eGFR has been calculated using the CKD EPI equation. This calculation has not been validated in all clinical situations. eGFR's persistently <60 mL/min signify possible Chronic Kidney Disease.    EGFR (Non-African Amer.)  Date Value Ref Range Status  12/04/2012 >60  Final    Comment:    eGFR values <74m/min/1.73 m2 may be an indication of chronic kidney disease (CKD). Calculated eGFR is useful in patients with stable renal function. The eGFR calculation will not be reliable in acutely ill patients when serum creatinine is changing rapidly. It is not useful in  patients on dialysis. The eGFR calculation may not be applicable to patients at the low and high extremes of body sizes, pregnant women, and vegetarians.    GFR calc non Af Amer  Date  Value Ref Range Status  04/08/2017 >60 >60 mL/min Final   GFR  Date Value Ref Range Status  08/12/2018 84.66 >60.00 mL/min Final         Passed - Valid encounter within last 6 months    Recent Outpatient Visits          3 weeks ago Routine general medical examination at a health care facility   [facility]   6 months ago Need for vaccination with 13-polyvalent pneumococcal conjugate vaccine   [facility]   7 months ago Other iron deficiency anemia   [facility]   11 months ago Screening for cervical cancer   [facility] [place] [facility]   1 year ago Need for Tdap vaccination   [facility]      Future Appointments            In 3 months [facility] [place]        . rosuvastatin (CRESTOR) 10 MG tablet 90 tablet 1    Sig: Take 1 tablet (10 mg total) by mouth daily.     Cardiovascular:  Antilipid - Statins Passed - 09/05/2018  8:59 AM      Passed - Total Cholesterol in normal range and within 360 days    Cholesterol  Date Value Ref Range Status  08/12/2018 163 0 - 200 mg/dL Final    Comment:    ATP III Classification       Desirable:  < 200 mg/dL               Borderline High:  200 - 239 mg/dL          High:  > = 240 mg/dL         Passed - LDL in normal range and within 360 days    LDL Cholesterol  Date Value Ref Range Status  08/12/2018 90 0 - 99 mg/dL Final         Passed - HDL  in normal range and within 360 days    HDL  Date Value Ref Range Status  08/12/2018 57.00 >39.00 mg/dL Final         Passed - Triglycerides in normal range and within 360 days    Triglycerides  Date Value Ref Range Status  08/12/2018 79.0 0.0 - 149.0 mg/dL Final    Comment:    Normal:  <150 mg/dLBorderline High:  150 - 199 mg/dL         Passed - Patient is not  pregnant      Passed - Valid encounter within last 12 months    Recent Outpatient Visits          3 weeks ago Routine general medical examination at a health care facility   [facility] [place] Scott, Charlene, MD   6 months ago Need for vaccination with 13-polyvalent pneumococcal conjugate vaccine   [facility] [place] Scott, Charlene, MD   7 months ago Other iron deficiency anemia   [facility], Charlene, MD   11 months ago Screening for cervical cancer   [facility] [place] Scott, Charlene, MD   1 year ago Need for Tdap vaccination   [facility] [place] Scott, Charlene, MD      Future Appointments            In 3 months Scott, Charlene, MD [facility] [place]         . telmisartan (MICARDIS) 20 MG tablet 90 tablet 1    Sig: Take 1 tablet (20 mg total) by mouth daily.     Cardiovascular:  Angiotensin Receptor Blockers Passed - 09/05/2018  8:59 AM      Passed - Cr in normal range and within 180 days    Creatinine  Date Value Ref Range Status  12/04/2012 0.90 0.60 - 1.30 mg/dL Final   Creatinine, Ser  Date Value Ref Range Status  08/12/2018 0.73 0.40 - 1.20 mg/dL Final         Passed - K in normal range and within 180 days    Potassium  Date Value Ref Range Status  08/12/2018 4.1 3.5 - 5.1 mEq/L Final  12/04/2012 4.1 3.5 - 5.1 mmol/L Final         Passed - Patient is not pregnant      Passed - Last BP in normal range    BP Readings from Last 1 Encounters:  08/14/18 130/70         Passed - Valid encounter within last 6 months    Recent Outpatient Visits          3 weeks ago Routine general medical examination at a health care facility   [facility] [place] Scott, Charlene, MD   6 months ago Need for vaccination with 13-polyvalent pneumococcal conjugate vaccine   [facility] [place] Scott, Charlene, MD   7 months ago Other iron deficiency anemia    [facility] [place] Scott, Charlene, MD   11 months ago Screening for cervical cancer   [facility] [place] Scott, Charlene, MD   1 year ago Need for Tdap vaccination   [facility] [place] Scott, Charlene, MD      Future Appointments            In 3 months Scott, Charlene, MD [facility] [place]          

## 2018-09-05 NOTE — Telephone Encounter (Signed)
Copied from [place] [id number]. Topic: Quick Communication - Rx Refill/Question >> Sep 05, 2018  8:24 AM Margot Ables wrote: Medication: clopidogrel (PLAVIX) 75 MG tablet, metFORMIN (GLUCOPHAGE) 500 MG tablet, rosuvastatin (CRESTOR) 10 MG tablet, telmisartan (MICARDIS) 20 MG tablet, pantoprazole (PROTONIX) 40 MG tablet - pt has about 1 week of each medication left - she states refills were supposed to be sent at [facility] 08/14/18 with Dr. Nicki Reaper - pt next [place] scheduled 12/18/18 with Dr. Nicki Reaper - please fill 90 day supply (refills if ok)  Has the patient contacted their pharmacy? Yes - no RX on file  Preferred Pharmacy (with phone number or street name): [facility] [place]

## 2018-09-06 ENCOUNTER — Other Ambulatory Visit: Payer: Self-pay | Admitting: Internal Medicine

## 2018-09-10 ENCOUNTER — Other Ambulatory Visit: Payer: Self-pay

## 2018-09-10 ENCOUNTER — Telehealth: Payer: Self-pay | Admitting: Internal Medicine

## 2018-09-10 MED ORDER — TELMISARTAN 20 MG PO TABS: 20.0000 mg | ORAL_TABLET | Freq: Every day | ORAL | 0 refills | Status: DC

## 2018-09-10 NOTE — Telephone Encounter (Signed)
Copied from [place] [id number]. Topic: Quick Communication - Rx Refill/Question >> Sep 10, 2018  9:51 AM Angel Floyd wrote: Medication: telmisartan (MICARDIS) 20 MG tablet  - medication currently on manufacturer backorder - calling to see if an alternative can be ordered - she states they have faxed request 2x already - if not responded to by 7pm 09/11/18 it will be up to the patient to f/u with provider.  please advise. Ref # [id number]

## 2018-09-10 NOTE — Telephone Encounter (Signed)
I sent in her Micardis to [facility] in [place] for 30 day supply per patient request. This is where her husband gets his medication

## 2018-09-10 NOTE — Telephone Encounter (Signed)
Patient is calling in regards to getting something else called in for Telmisartan 20mg . She also states they called her and told her that clopidogrel (PLAVIX) 75 MG tablet  Could not be refill because it is a drug interaction with another medication but will not give her the other medication it is in regards too.

## 2018-09-10 NOTE — Telephone Encounter (Signed)
Spoke with pharmacy. They have already approved her plavix refill but the Micardis is on manufacturer back order. They are wanting to know if there is something we can switch her to so they can go ahead and fill it.

## 2018-09-10 NOTE — Telephone Encounter (Signed)
You said this was already addressed.  Please clarify in note and let me know if I need to do anything.

## 2018-09-23 ENCOUNTER — Encounter: Payer: Self-pay | Admitting: Internal Medicine

## 2018-09-23 ENCOUNTER — Other Ambulatory Visit: Payer: Self-pay

## 2018-09-23 MED ORDER — TELMISARTAN 20 MG PO TABS: 20.0000 mg | ORAL_TABLET | Freq: Every day | ORAL | 2 refills | Status: DC

## 2018-09-26 DIAGNOSIS — H2513 Age-related nuclear cataract, bilateral: Secondary | ICD-10-CM | POA: Diagnosis not present

## 2018-09-26 LAB — HM DIABETES EYE EXAM

## 2018-10-13 DIAGNOSIS — H6693 Otitis media, unspecified, bilateral: Secondary | ICD-10-CM | POA: Diagnosis not present

## 2018-10-21 DIAGNOSIS — H9210 Otorrhea, unspecified ear: Secondary | ICD-10-CM | POA: Diagnosis not present

## 2018-10-21 DIAGNOSIS — H903 Sensorineural hearing loss, bilateral: Secondary | ICD-10-CM | POA: Diagnosis not present

## 2018-10-21 DIAGNOSIS — H90A22 Sensorineural hearing loss, unilateral, left ear, with restricted hearing on the contralateral side: Secondary | ICD-10-CM | POA: Diagnosis not present

## 2018-10-21 DIAGNOSIS — H6980 Other specified disorders of Eustachian tube, unspecified ear: Secondary | ICD-10-CM | POA: Diagnosis not present

## 2018-10-30 ENCOUNTER — Encounter: Payer: Self-pay | Admitting: Internal Medicine

## 2018-10-30 DIAGNOSIS — H903 Sensorineural hearing loss, bilateral: Secondary | ICD-10-CM | POA: Diagnosis not present

## 2018-10-30 DIAGNOSIS — H9210 Otorrhea, unspecified ear: Secondary | ICD-10-CM | POA: Diagnosis not present

## 2018-10-30 DIAGNOSIS — H6980 Other specified disorders of Eustachian tube, unspecified ear: Secondary | ICD-10-CM | POA: Diagnosis not present

## 2018-10-31 ENCOUNTER — Ambulatory Visit: Payer: Medicare HMO | Admitting: Family Medicine

## 2018-10-31 ENCOUNTER — Encounter: Payer: Self-pay | Admitting: Family Medicine

## 2018-10-31 ENCOUNTER — Ambulatory Visit (INDEPENDENT_AMBULATORY_CARE_PROVIDER_SITE_OTHER): Payer: Medicare HMO

## 2018-10-31 VITALS — BP 134/88 | HR 65 | Temp 97.9°F | Ht 65.0 in | Wt 161.8 lb

## 2018-10-31 DIAGNOSIS — R5383 Other fatigue: Secondary | ICD-10-CM | POA: Diagnosis not present

## 2018-10-31 DIAGNOSIS — R05 Cough: Secondary | ICD-10-CM

## 2018-10-31 DIAGNOSIS — D508 Other iron deficiency anemias: Secondary | ICD-10-CM | POA: Diagnosis not present

## 2018-10-31 DIAGNOSIS — R053 Chronic cough: Secondary | ICD-10-CM

## 2018-10-31 DIAGNOSIS — R0989 Other specified symptoms and signs involving the circulatory and respiratory systems: Secondary | ICD-10-CM

## 2018-10-31 MED ORDER — PREDNISONE 10 MG (21) PO TBPK: ORAL_TABLET | ORAL | 0 refills | Status: DC

## 2018-10-31 MED ORDER — LEVOFLOXACIN 750 MG PO TABS: 750.0000 mg | ORAL_TABLET | Freq: Every day | ORAL | 0 refills | Status: DC

## 2018-10-31 NOTE — Telephone Encounter (Signed)
Scheduled with Lauren today at 4

## 2018-10-31 NOTE — Progress Notes (Signed)
Subjective:    Patient ID: Angel Floyd, female    DOB: 1950-11-30, 67 y.o.   MRN: [id number]  HPI  Presents to clinic c/o cough and fatigue for 2 months.  Patient does report a history of iron deficiency anemia, currently is not on her iron supplement.  Patient wonders if she possibly is having low iron again.  Patient also recently was seen by ENT (4 weeks ago) and was treated for a left-sided ear infection with Ceftin course.  Patient did have a follow-up with ENT earlier this week and was told that the ear infection appears to have cleared.  Patient states the cough is present all day, and seems worse at night.  Sometimes she does cough white phlegm, other times the cough feels wet but no phlegm is produced.  Patient Active Problem List   Diagnosis Date Noted  . Chest pain 10/13/2015  . Rash and nonspecific skin eruption 06/15/2015  . Health care maintenance 04/08/2015  . Carotid bruit 12/12/2014  . Left leg pain 07/12/2014  . Change in bowel function 07/12/2014  . Hypertension 10/10/2012  . Hypercholesterolemia 10/10/2012  . Type 2 diabetes mellitus with neurological complications ([place]) 74/94/4967  . TIA (transient ischemic attack) 10/10/2012  . Anemia 10/10/2012  . History of breast cancer 10/10/2012   Social History   Tobacco Use  . Smoking status: Never Smoker  . Smokeless tobacco: Never Used  Substance Use Topics  . Alcohol use: No    Alcohol/week: 0.0 standard drinks   Review of Systems   Constitutional: +fatigue HENT: +ear pain Negative for congestion, sinus pain and sore throat.   Eyes: Negative.   Respiratory: +cough. Negative for shortness of breath and wheezing.   Cardiovascular: Negative for chest pain, palpitations and leg swelling.  Gastrointestinal: Negative for abdominal pain, diarrhea, nausea and vomiting.  Genitourinary: Negative for dysuria, frequency and urgency.  Musculoskeletal: Negative for arthralgias and myalgias.  Skin: Negative for color  change, pallor and rash.  Neurological: Negative for syncope, light-headedness and headaches.  Psychiatric/Behavioral: The patient is not nervous/anxious.       Objective:   Physical Exam Vitals signs and nursing note reviewed.  Constitutional:      General: She is not in acute distress.    Appearance: She is not toxic-appearing.  HENT:     Head: Normocephalic and atraumatic.     Ears:     Comments: Some fullness bilat TMs, not red or bulging. Scar tissue seen on left TM    Nose: Nose normal.     Mouth/Throat:     Mouth: Mucous membranes are moist.  Eyes:     General: No scleral icterus.    Extraocular Movements: Extraocular movements intact.     Conjunctiva/sclera: Conjunctivae normal.  Neck:     Musculoskeletal: Normal range of motion and neck supple. No neck rigidity.  Cardiovascular:     Rate and Rhythm: Normal rate and regular rhythm.  Pulmonary:     Effort: Pulmonary effort is normal.     Breath sounds: Rhonchi and rales (scattered rales throughout bases) present.  Skin:    General: Skin is warm and dry.     Coloration: Skin is not pale.  Neurological:     Mental Status: She is alert and oriented to person, place, and time.  Psychiatric:        Mood and Affect: Mood normal.        Behavior: Behavior normal.       Vitals:  10/31/18 1614 10/31/18 1630  BP: (!) 138/98 134/88  Pulse: 65   Temp: 97.9 F (36.6 C)   SpO2: 97%    Assessment & Plan:   Chronic coughing/bilateral rales- due to cough and rales heard on lung auscultation we will have patient do chest x-ray in clinic today and also she will be treated with course of Levaquin and a steroid taper.  Offered albuterol inhaler, patient declines.  Fatigue/iron deficiency anemia- fatigue possibly could be related to iron deficiency anemia so we will be sure to check CBC and iron studies and lab work.  We will also check CMP and thyroid panel to further investigate fatigue.  Patient aware she will be contacted  with results of her x-ray and lab work.  Discussed that if the coughing continues even with this treatment course, we most likely will have to consider a pulmonology referral.

## 2018-11-01 ENCOUNTER — Telehealth: Payer: Self-pay

## 2018-11-01 DIAGNOSIS — Z853 Personal history of malignant neoplasm of breast: Secondary | ICD-10-CM | POA: Diagnosis not present

## 2018-11-01 DIAGNOSIS — C50912 Malignant neoplasm of unspecified site of left female breast: Secondary | ICD-10-CM | POA: Diagnosis not present

## 2018-11-01 DIAGNOSIS — Z170 Estrogen receptor positive status [ER+]: Secondary | ICD-10-CM | POA: Diagnosis not present

## 2018-11-01 DIAGNOSIS — D0512 Intraductal carcinoma in situ of left breast: Secondary | ICD-10-CM | POA: Diagnosis not present

## 2018-11-01 LAB — CBC WITH DIFFERENTIAL/PLATELET
BASOS PCT: 1.3 % (ref 0.0–3.0)
Basophils Absolute: 0.1 10*3/uL (ref 0.0–0.1)
EOS ABS: 0.2 10*3/uL (ref 0.0–0.7)
Eosinophils Relative: 3.6 % (ref 0.0–5.0)
HCT: 39.4 % (ref 36.0–46.0)
HEMOGLOBIN: 13.2 g/dL (ref 12.0–15.0)
Lymphocytes Relative: 28.7 % (ref 12.0–46.0)
Lymphs Abs: 1.7 10*3/uL (ref 0.7–4.0)
MCHC: 33.6 g/dL (ref 30.0–36.0)
MCV: 89.2 fl (ref 78.0–100.0)
Monocytes Absolute: 0.5 10*3/uL (ref 0.1–1.0)
Monocytes Relative: 8.2 % (ref 3.0–12.0)
Neutro Abs: 3.4 10*3/uL (ref 1.4–7.7)
Neutrophils Relative %: 58.2 % (ref 43.0–77.0)
PLATELETS: 236 10*3/uL (ref 150.0–400.0)
RBC: 4.42 Mil/uL (ref 3.87–5.11)
RDW: 12.6 % (ref 11.5–15.5)
WBC: 5.9 10*3/uL (ref 4.0–10.5)

## 2018-11-01 LAB — IRON,TIBC AND FERRITIN PANEL
%SAT: 33 % (calc) (ref 16–45)
Ferritin: 132 ng/mL (ref 16–288)
Iron: 64 ug/dL (ref 45–160)
TIBC: 192 mcg/dL (calc) — ABNORMAL LOW (ref 250–450)

## 2018-11-01 LAB — THYROID PANEL WITH TSH
Free Thyroxine Index: 3 (ref 1.4–3.8)
T3 Uptake: 33 % (ref 22–35)
T4, Total: 9.1 ug/dL (ref 5.1–11.9)
TSH: 0.97 mIU/L (ref 0.40–4.50)

## 2018-11-01 LAB — COMPREHENSIVE METABOLIC PANEL
ALT: 15 U/L (ref 0–35)
AST: 15 U/L (ref 0–37)
Albumin: 4.2 g/dL (ref 3.5–5.2)
Alkaline Phosphatase: 64 U/L (ref 39–117)
BILIRUBIN TOTAL: 0.5 mg/dL (ref 0.2–1.2)
BUN: 13 mg/dL (ref 6–23)
CO2: 26 meq/L (ref 19–32)
Calcium: 9 mg/dL (ref 8.4–10.5)
Chloride: 105 mEq/L (ref 96–112)
Creatinine, Ser: 0.77 mg/dL (ref 0.40–1.20)
GFR: 79.55 mL/min (ref >60.00)
Glucose, Bld: 98 mg/dL (ref 70–99)
Potassium: 3.7 mEq/L (ref 3.5–5.1)
Sodium: 142 mEq/L (ref 135–145)
Total Protein: 6.5 g/dL (ref 6.0–8.3)

## 2018-11-01 NOTE — Telephone Encounter (Signed)
Called patient, gave results and let her know that iron studies are still pending

## 2018-11-01 NOTE — Telephone Encounter (Signed)
Copied from [place] [id number]. Topic: General - Other >> Nov 01, 2018  1:45 PM Mcneil, Ja-Kwan wrote: Reason for CRM: Pt stated she specifically requested labs for her iron to be checked but it was not done. Pt requests a call back. Cb# [phone]

## 2018-11-02 MED ORDER — FERROUS SULFATE 325 (65 FE) MG PO TBEC: 325.0000 mg | DELAYED_RELEASE_TABLET | Freq: Every day | ORAL | 1 refills | Status: DC

## 2018-11-02 NOTE — Addendum Note (Signed)
Addended by: Philis Nettle on: 11/02/2018 10:52 AM   Modules accepted: Orders

## 2018-11-06 ENCOUNTER — Telehealth: Payer: Self-pay

## 2018-11-06 NOTE — Telephone Encounter (Signed)
Copied from [place] [phone]. Topic: General - Call Back - No Documentation >> Nov 06, 2018  4:09 PM Sheran Luz wrote: Reason for CRM: Patient returning call to [place]. Patient is requesting a call back. Please advise.

## 2018-11-07 NOTE — Telephone Encounter (Signed)
LMTCB

## 2018-11-07 NOTE — Telephone Encounter (Signed)
Pt scheduled for follow up with Dr. Nicki Reaper

## 2018-11-07 NOTE — Telephone Encounter (Signed)
Pt called in requesting a call back from from [place] .

## 2018-11-11 ENCOUNTER — Telehealth: Payer: Self-pay | Admitting: Internal Medicine

## 2018-11-11 ENCOUNTER — Encounter: Payer: Self-pay | Admitting: Internal Medicine

## 2018-11-11 ENCOUNTER — Ambulatory Visit: Payer: Medicare HMO | Admitting: Internal Medicine

## 2018-11-11 VITALS — BP 116/58 | HR 78 | Temp 98.0°F | Wt 153.6 lb

## 2018-11-11 DIAGNOSIS — B349 Viral infection, unspecified: Secondary | ICD-10-CM | POA: Diagnosis not present

## 2018-11-11 DIAGNOSIS — E1149 Type 2 diabetes mellitus with other diabetic neurological complication: Secondary | ICD-10-CM

## 2018-11-11 DIAGNOSIS — R053 Chronic cough: Secondary | ICD-10-CM

## 2018-11-11 DIAGNOSIS — I10 Essential (primary) hypertension: Secondary | ICD-10-CM

## 2018-11-11 DIAGNOSIS — R05 Cough: Secondary | ICD-10-CM | POA: Diagnosis not present

## 2018-11-11 MED ORDER — ALBUTEROL SULFATE HFA 108 (90 BASE) MCG/ACT IN AERS: 2.0000 | INHALATION_SPRAY | Freq: Four times a day (QID) | RESPIRATORY_TRACT | 0 refills | Status: DC | PRN

## 2018-11-11 MED ORDER — PREDNISONE 10 MG PO TABS: ORAL_TABLET | ORAL | 0 refills | Status: DC

## 2018-11-11 NOTE — Patient Instructions (Signed)
Robitussin DM twice a day as needed for cough and congestion 

## 2018-11-11 NOTE — Telephone Encounter (Signed)
My chart message sent to pt for update.   

## 2018-11-11 NOTE — Progress Notes (Signed)
Patient ID: Angel Floyd, female   DOB: 1951-11-19, 67 y.o.   MRN: [id number]   Subjective:    Patient ID: Angel Floyd, female    DOB: 1951-02-12, 67 y.o.   MRN: [id number]  HPI  Patient here as a work in with concerns regarding persistent cough and congestion.  She was evaluated 10/31/18.  Was seen for chronic cough and congestion.  cxr clear.  Was treated with prednisone and levaquin.  Also saw ENT for evaluation of her ear.  Treated with abx.  States her ear is better.  Still with increased cough and congestion.  No sinus pressure.  No fever.  Coughing fits.  Several days ago, ate barbeque, potato salad and slaw.  Husband was sick initially.  She then developed diarrhea and nausea/vomiting.  This has resolved.  She is eating now.  Getting hungry.     Past Medical History:  Diagnosis Date  . Anemia   . Anginal pain ([place])   . Breast cancer ([place])   . Diabetes mellitus ([place])   . History of abnormal Pap smear    class III, required cryosurgery  . Hypercholesterolemia   . Hypertension   . TIA (transient ischemic attack)    Past Surgical History:  Procedure Laterality Date  . BREAST CYST ASPIRATION  1989  . COLONOSCOPY WITH PROPOFOL N/A 11/05/2017   Procedure: COLONOSCOPY WITH PROPOFOL;  Surgeon: Lollie Sails, MD;  Location: [facility] ENDOSCOPY;  Service: Endoscopy;  Laterality: N/A;  . DILATION AND CURETTAGE OF UTERUS    . ESOPHAGOGASTRODUODENOSCOPY (EGD) WITH PROPOFOL N/A 11/05/2017   Procedure: ESOPHAGOGASTRODUODENOSCOPY (EGD) WITH PROPOFOL;  Surgeon: Lollie Sails, MD;  Location: [facility] ENDOSCOPY;  Service: Endoscopy;  Laterality: N/A;  . FOOT SURGERY     morton's neuroma removed - right foot  . FOOT SURGERY  10/2009   achilles tendon release with reconstructive surgery  . GYNECOLOGIC CRYOSURGERY     class III pap  . HAND SURGERY     cyst removed - left hand  . hysteroscopy and D&C  7/09  . KNEE ARTHROSCOPY Left [id number]   removed tear & resurfaced area  . KNEE SURGERY   09/20/12   torn meniscus, (Dr Leanor Kail)  . TUBAL LIGATION     Family History  Problem Relation Age of Onset  . Lung cancer Father   . Alcohol abuse Father   . Cancer Father        lung  . CVA Mother   . Colon cancer Mother   . Alcohol abuse Mother   . Cancer Mother        colon  . Stroke Mother   . Colon cancer Sister   . Diabetes Maternal Grandfather    Social History   Socioeconomic History  . Marital status: Married    Spouse name: Not on file  . Number of children: 2  . Years of education: Not on file  . Highest education level: Not on file  Occupational History  . Not on file  Social Needs  . Financial resource strain: Not on file  . Food insecurity:    Worry: Not on file    Inability: Not on file  . Transportation needs:    Medical: Not on file    Non-medical: Not on file  Tobacco Use  . Smoking status: Never Smoker  . Smokeless tobacco: Never Used  Substance and Sexual Activity  . Alcohol use: No    Alcohol/week: 0.0 standard drinks  . Drug use:  No  . Sexual activity: Not on file  Lifestyle  . Physical activity:    Days per week: Not on file    Minutes per session: Not on file  . Stress: Not on file  Relationships  . Social connections:    Talks on phone: Not on file    Gets together: Not on file    Attends religious service: Not on file    Active member of club or organization: Not on file    Attends meetings of clubs or organizations: Not on file    Relationship status: Not on file  Other Topics Concern  . Not on file  Social History Narrative  . Not on file    Outpatient Encounter Medications as of 11/11/2018  Medication Sig  . aspirin 81 MG tablet Take 81 mg by mouth daily.  . clopidogrel (PLAVIX) 75 MG tablet Take 1 tablet (75 mg total) by mouth daily.  . Fe Fum-FePoly-Vit C-Vit B3 (INTEGRA) 62.5-62.5-40-3 MG CAPS Take 1 capsule by mouth daily.  . ferrous sulfate 325 (65 FE) MG EC tablet Take 1 tablet (325 mg total) by mouth  daily with breakfast.  . fish oil-omega-3 fatty acids 1000 MG capsule Take 2 g by mouth daily.   Marland Kitchen glucose blood test strip Use as instructed to check blood sugars three times daily. Has One Touch Ultra glucometer. Dx 250.00  . metFORMIN (GLUCOPHAGE) 500 MG tablet Take 1 tablet (500 mg total) by mouth daily.  . pantoprazole (PROTONIX) 40 MG tablet   . rosuvastatin (CRESTOR) 10 MG tablet Take 1 tablet (10 mg total) by mouth daily.  Marland Kitchen telmisartan (MICARDIS) 20 MG tablet Take 1 tablet (20 mg total) by mouth daily.  Marland Kitchen albuterol (PROVENTIL HFA;VENTOLIN HFA) 108 (90 Base) MCG/ACT inhaler Inhale 2 puffs into the lungs every 6 (six) hours as needed for wheezing or shortness of breath. or prn cough. Also, please instruct pt on proper way to use inhaler.  . predniSONE (DELTASONE) 10 MG tablet Take 6 tablets x 1 day and then decrease by 1/2 tablet per day until down to zero mg.  . [DISCONTINUED] levofloxacin (LEVAQUIN) 750 MG tablet Take 1 tablet (750 mg total) by mouth daily. (Patient not taking: Reported on 11/11/2018)  . [DISCONTINUED] predniSONE (STERAPRED UNI-PAK 21 TAB) 10 MG (21) TBPK tablet Take according to pack instructions (Patient not taking: Reported on 11/11/2018)   No facility-administered encounter medications on file as of 11/11/2018.     Review of Systems  Constitutional: Negative for fever and unexpected weight change.  HENT: Positive for congestion. Negative for sinus pressure.   Respiratory: Positive for cough. Negative for chest tightness and shortness of breath.   Cardiovascular: Negative for chest pain and leg swelling.  Gastrointestinal: Negative for abdominal pain.       No diarrhea or vomiting now.    Genitourinary: Negative for difficulty urinating and dysuria.  Musculoskeletal: Negative for joint swelling and myalgias.  Skin: Negative for color change and rash.  Neurological: Negative for dizziness, light-headedness and headaches.  Psychiatric/Behavioral: Negative for  agitation and dysphoric mood.       Objective:    Physical Exam Constitutional:      General: She is not in acute distress.    Appearance: Normal appearance.  HENT:     Nose: Nose normal. No congestion.     Mouth/Throat:     Pharynx: No oropharyngeal exudate or posterior oropharyngeal erythema.  Neck:     Musculoskeletal: Neck supple. No muscular tenderness.  Thyroid: No thyromegaly.  Cardiovascular:     Rate and Rhythm: Normal rate and regular rhythm.  Pulmonary:     Effort: No respiratory distress.     Breath sounds: Normal breath sounds. No wheezing.     Comments: Increased cough with forced expiration.   Abdominal:     General: Bowel sounds are normal.     Palpations: Abdomen is soft.     Tenderness: There is no abdominal tenderness.  Musculoskeletal:        General: No swelling or tenderness.  Lymphadenopathy:     Cervical: No cervical adenopathy.  Skin:    Findings: No erythema or rash.  Neurological:     Mental Status: She is alert.  Psychiatric:        Mood and Affect: Mood normal.        Thought Content: Thought content normal.     BP (!) 116/58 (BP Location: Left Arm, Patient Position: Sitting, Cuff Size: Normal)   Pulse 78   Temp 98 F (36.7 C)   Wt 153 lb 9.6 oz (69.7 kg)   SpO2 95%   BMI 25.56 kg/m  Wt Readings from Last 3 Encounters:  11/11/18 153 lb 9.6 oz (69.7 kg)  10/31/18 161 lb 12.8 oz (73.4 kg)  08/14/18 163 lb 6.4 oz (74.1 kg)     Lab Results  Component Value Date   WBC 5.9 10/31/2018   HGB 13.2 10/31/2018   HCT 39.4 10/31/2018   PLT 236.0 10/31/2018   GLUCOSE 98 10/31/2018   CHOL 163 08/12/2018   TRIG 79.0 08/12/2018   HDL 57.00 08/12/2018   LDLCALC 90 08/12/2018   ALT 15 10/31/2018   AST 15 10/31/2018   NA 142 10/31/2018   K 3.7 10/31/2018   CL 105 10/31/2018   CREATININE 0.77 10/31/2018   BUN 13 10/31/2018   CO2 26 10/31/2018   TSH 0.97 10/31/2018   INR 0.95 04/08/2017   HGBA1C 6.7 (H) 08/12/2018   MICROALBUR  1.0 09/19/2017       Assessment & Plan:   Problem List Items Addressed This Visit    Hypertension    Blood pressure under good control.  Continue same medication regimen.  Follow pressures.  Follow metabolic panel.        Persistent cough    Recently treated with abx for her ear and treated with levaquin and prednisone for respiratory infection.  Recent cxr clear.  Persistent cough and congestion.  Hold on further abx.  Prednisone taper as directed.  Rest.  Fluids.  Albuterol inhaler as directed.   If persistent, will refer to pulmonary for further evaluation and treatment.        Type 2 diabetes mellitus with neurological complications (HCC)    Low carb diet and exercise.  Follow met b and a1c.         Other Visit Diagnoses    Viral illness    -  Primary   Previous diarrhea and vomiting.  Resolved.  Eating better.  Follow.         Einar Pheasant, MD

## 2018-11-14 ENCOUNTER — Encounter: Payer: Self-pay | Admitting: Internal Medicine

## 2018-11-17 ENCOUNTER — Encounter: Payer: Self-pay | Admitting: Internal Medicine

## 2018-11-17 DIAGNOSIS — R05 Cough: Secondary | ICD-10-CM | POA: Insufficient documentation

## 2018-11-17 DIAGNOSIS — R053 Chronic cough: Secondary | ICD-10-CM | POA: Insufficient documentation

## 2018-11-17 NOTE — Assessment & Plan Note (Signed)
Low carb diet and exercise.  Follow met b and a1c.   

## 2018-11-17 NOTE — Assessment & Plan Note (Signed)
Recently treated with abx for her ear and treated with levaquin and prednisone for respiratory infection.  Recent cxr clear.  Persistent cough and congestion.  Hold on further abx.  Prednisone taper as directed.  Rest.  Fluids.  Albuterol inhaler as directed.   If persistent, will refer to pulmonary for further evaluation and treatment.

## 2018-11-17 NOTE — Assessment & Plan Note (Signed)
Blood pressure under good control.  Continue same medication regimen.  Follow pressures.  Follow metabolic panel.   

## 2018-11-25 ENCOUNTER — Other Ambulatory Visit: Payer: Self-pay | Admitting: Internal Medicine

## 2018-11-25 DIAGNOSIS — R05 Cough: Secondary | ICD-10-CM

## 2018-11-25 DIAGNOSIS — R053 Chronic cough: Secondary | ICD-10-CM

## 2018-11-25 NOTE — Progress Notes (Signed)
Order placed for pulmonary referral.  

## 2018-11-28 ENCOUNTER — Other Ambulatory Visit: Payer: Self-pay | Admitting: Physician Assistant

## 2018-11-28 DIAGNOSIS — H903 Sensorineural hearing loss, bilateral: Secondary | ICD-10-CM

## 2018-11-28 NOTE — Telephone Encounter (Signed)
Do you mind updating her about the pulmonary referral.

## 2018-12-03 ENCOUNTER — Ambulatory Visit (INDEPENDENT_AMBULATORY_CARE_PROVIDER_SITE_OTHER): Payer: Medicare HMO | Admitting: Pulmonary Disease

## 2018-12-03 ENCOUNTER — Other Ambulatory Visit: Payer: Self-pay

## 2018-12-03 ENCOUNTER — Encounter: Payer: Self-pay | Admitting: Pulmonary Disease

## 2018-12-03 VITALS — BP 156/88 | HR 88 | Ht 65.0 in | Wt 156.4 lb

## 2018-12-03 DIAGNOSIS — J45901 Unspecified asthma with (acute) exacerbation: Secondary | ICD-10-CM | POA: Diagnosis not present

## 2018-12-03 DIAGNOSIS — R05 Cough: Secondary | ICD-10-CM | POA: Diagnosis not present

## 2018-12-03 DIAGNOSIS — J310 Chronic rhinitis: Secondary | ICD-10-CM

## 2018-12-03 DIAGNOSIS — R053 Chronic cough: Secondary | ICD-10-CM

## 2018-12-03 DIAGNOSIS — K219 Gastro-esophageal reflux disease without esophagitis: Secondary | ICD-10-CM

## 2018-12-03 MED ORDER — FLUTICASONE FUROATE 200 MCG/ACT IN AEPB: 200.0000 ug | INHALATION_SPRAY | Freq: Every day | RESPIRATORY_TRACT | 10 refills | Status: DC

## 2018-12-03 MED ORDER — TRIAMCINOLONE ACETONIDE 55 MCG/ACT NA AERO: 2.0000 | INHALATION_SPRAY | Freq: Every day | NASAL | 5 refills | Status: DC

## 2018-12-03 MED ORDER — DEXTROMETHORPHAN-GUAIFENESIN 20-400 MG PO TABS: 1.0000 | ORAL_TABLET | Freq: Two times a day (BID) | ORAL | 5 refills | Status: DC

## 2018-12-03 MED ORDER — FLUTICASONE FUROATE-VILANTEROL 100-25 MCG/INH IN AEPB: 1.0000 | INHALATION_SPRAY | Freq: Every day | RESPIRATORY_TRACT | 0 refills | Status: DC

## 2018-12-03 NOTE — Patient Instructions (Signed)
New prescription: Arnuity, 1 inhalation daily.  Rinse mouth after use.  Sample of Breo inhaler provided which can be considered as equivalent  New prescription: Nasacort AQ, 2 sprays per nostril daily.  New prescription: Guaifenesin-dextromethorphan (Robitussin-DM), 1 tab twice a day on a schedule for 1 week then as needed thereafter  Change Protonix to 30 minutes prior to dinnertime  Follow-up in 4-6 weeks.  Call sooner if needed

## 2018-12-04 NOTE — Progress Notes (Signed)
PULMONARY CONSULT NOTE  Requesting MD/Service: Einar Pheasant, MD Date of initial consultation: 12/03/18 Reason for consultation: Chronic cough  PT PROFILE: 68 y.o. female never smoker referred for evaluation of chronic cough of 4 months duration  DATA:  INTERVAL:  HPI:  As above.  She reports a "constant cough" of approximately 4 months duration.  The cough is nonproductive.  It is variable throughout the day.  It does not awaken her from sleep.  She does not recall details of its onset but does not believe that she was sick with any respiratory illness.  She believes her cough is somewhat worse in the evenings.  It is not affected by changes in the weather.  It improves when lying flat in bed.  It worsens when getting up and moving about.  She is found no medications other than Robitussin-DM that relieve her cough.  She also notes mildly progressive exertional dyspnea over the past 4 months.  She has no prior history of allergies or asthma.  In December, she was seen for severe left otalgia.  She was treated with antibiotics presumably for otitis media.  She has been seen by ENT Tami Ribas) and is scheduled for an MRI to better evaluate her left ear.  This is to be performed next week.  She is chronically treated for GERD with pantoprazole.  She has never smoked.  She is retired.  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  She has no significant occupational or environmental exposures.  There is 1 dog in the home.  She has no unusual hobbies.  She was never in the [employer].  She has no history of travel to exotic countries.  She is lived in [place] all of her life.  There is no history of TB exposures.  She has been tested with PPD in the past on several occasions and these were always negative.  She denies orthopnea, paroxysmal nocturnal dyspnea, lower extremity edema, calf tenderness.  She denies hemoptysis, odynophagia, chest pain,  palpitations, unexplained weight loss, rashes, joint pain or swelling.  Past Medical History:  Diagnosis Date  . Anemia   . Anginal pain ([place])   . Breast cancer ([place])   . Diabetes mellitus ([place])   . History of abnormal Pap smear    class III, required cryosurgery  . Hypercholesterolemia   . Hypertension   . TIA (transient ischemic attack)     Past Surgical History:  Procedure Laterality Date  . BREAST CYST ASPIRATION  1989  . COLONOSCOPY WITH PROPOFOL N/A 11/05/2017   Procedure: COLONOSCOPY WITH PROPOFOL;  Surgeon: Lollie Sails, MD;  Location: [facility] ENDOSCOPY;  Service: Endoscopy;  Laterality: N/A;  . DILATION AND CURETTAGE OF UTERUS    . ESOPHAGOGASTRODUODENOSCOPY (EGD) WITH PROPOFOL N/A 11/05/2017   Procedure: ESOPHAGOGASTRODUODENOSCOPY (EGD) WITH PROPOFOL;  Surgeon: Lollie Sails, MD;  Location: [facility] ENDOSCOPY;  Service: Endoscopy;  Laterality: N/A;  . FOOT SURGERY     morton's neuroma removed - right foot  . FOOT SURGERY  10/2009   achilles tendon release with reconstructive surgery  . GYNECOLOGIC CRYOSURGERY     class III pap  . HAND SURGERY     cyst removed - left hand  . hysteroscopy and D&C  7/09  . KNEE ARTHROSCOPY Left [id number]   removed tear & resurfaced area  . KNEE SURGERY  09/20/12   torn meniscus, (Dr Leanor Kail)  . TUBAL LIGATION      MEDICATIONS: I have  reviewed all medications and confirmed regimen as documented  Social History   Socioeconomic History  . Marital status: Married    Spouse name: Not on file  . Number of children: 2  . Years of education: Not on file  . Highest education level: Not on file  Occupational History  . Not on file  Social Needs  . Financial resource strain: Not on file  . Food insecurity:    Worry: Not on file    Inability: Not on file  . Transportation needs:    Medical: Not on file    Non-medical: Not on file  Tobacco Use  . Smoking status: Never Smoker  . Smokeless tobacco: Never Used  Substance  and Sexual Activity  . Alcohol use: No    Alcohol/week: 0.0 standard drinks  . Drug use: No  . Sexual activity: Not on file  Lifestyle  . Physical activity:    Days per week: Not on file    Minutes per session: Not on file  . Stress: Not on file  Relationships  . Social connections:    Talks on phone: Not on file    Gets together: Not on file    Attends religious service: Not on file    Active member of club or organization: Not on file    Attends meetings of clubs or organizations: Not on file    Relationship status: Not on file  . Intimate partner violence:    Fear of current or ex partner: Not on file    Emotionally abused: Not on file    Physically abused: Not on file    Forced sexual activity: Not on file  Other Topics Concern  . Not on file  Social History Narrative  . Not on file    Family History  Problem Relation Age of Onset  . Lung cancer Father   . Alcohol abuse Father   . Cancer Father        lung  . CVA Mother   . Colon cancer Mother   . Alcohol abuse Mother   . Cancer Mother        colon  . Stroke Mother   . Colon cancer Sister   . Diabetes Maternal Grandfather     ROS: No fever, myalgias/arthralgias, unexplained weight loss or weight gain No new focal weakness or sensory deficits No otalgia, hearing loss, visual changes, nasal and sinus symptoms, mouth and throat problems No neck pain or adenopathy No abdominal pain, N/V/D, diarrhea, change in bowel pattern No dysuria, change in urinary pattern   Vitals:   12/03/18 1417 12/03/18 1425  BP:  (!) 156/88  Pulse:  88  SpO2:  97%  Weight: 156 lb 6.4 oz (70.9 kg)   Height: 5\' 5"  (1.651 m)      EXAM:  Gen: WDWN, No overt respiratory distress HEENT: NCAT, sclerae white, right tympanic membrane mildly erythematous, left tympanic membrane with significant scarring, nasal mucosa erythematous, turbinates enlarged bilaterally, oropharynx normal Neck: Supple without LAN, thyromegaly, JVD Lungs:  breath sounds diffusely coarse with scattered expiratory wheezes.  Percussion note normal throughout. Cardiovascular: RRR, no murmurs noted Abdomen: Soft, nontender, normal BS Ext: without clubbing, cyanosis, edema Neuro: CNs grossly intact, motor and sensory intact Skin: Limited exam, no lesions noted  DATA:   BMP Latest Ref Rng & Units 10/31/2018 08/12/2018 01/21/2018  Glucose 70 - 99 mg/dL 98 144(H) 113(H)  BUN 6 - 23 mg/dL 13 17 20   Creatinine 0.40 - 1.20 mg/dL 0.77  0.73 0.72  Sodium 135 - 145 mEq/L 142 143 142  Potassium 3.5 - 5.1 mEq/L 3.7 4.1 4.3  Chloride 96 - 112 mEq/L 105 106 105  CO2 19 - 32 mEq/L 26 31 30   Calcium 8.4 - 10.5 mg/dL 9.0 9.3 9.4    CBC Latest Ref Rng & Units 10/31/2018 08/12/2018 04/08/2017  WBC 4.0 - 10.5 K/uL 5.9 4.7 5.2  Hemoglobin 12.0 - 15.0 g/dL 13.2 13.1 14.2  Hematocrit 36.0 - 46.0 % 39.4 38.5 40.9  Platelets 150.0 - 400.0 K/uL 236.0 173.0 192    CXR 10/31/18: No acute cardiac or pulmonary findings  I have personally reviewed all chest radiographs reported above including CXRs and CT chest unless otherwise indicated  IMPRESSION:     ICD-10-CM   1. Chronic cough R05   2. Chronic rhinitis J31.0   3. Asthmatic bronchitis J45.901   4. GERD K21.9    She has evidence of all 3 components of the "cough triad".  She is already on a PPI and reflux symptoms seem to be well controlled.  There also might be a component of "cyclical cough".  PLAN:  Continue Protonix but recommend that she change it to 30 minutes prior to dinnertime  New prescription: Arnuity, 1 inhalation daily.  Rinse mouth after use.  Sample of Breo inhaler provided as we did not have any samples of Arnuity  New prescription: Nasacort AQ, 2 sprays per nostril daily  New prescription: Guaifenesin-dextromethorphan, 1 tablet twice a day on a schedule for the first week, then as needed thereafter  Follow-up in 4 to 6 weeks.  She is to call sooner as needed   Merton Border, MD PCCM  service Mobile [phone] Pager [phone] 12/04/2018 12:01 PM

## 2018-12-07 ENCOUNTER — Ambulatory Visit
Admission: RE | Admit: 2018-12-07 | Discharge: 2018-12-07 | Disposition: A | Discharge status: Home / Self Care | Payer: Medicare HMO | Source: Ambulatory Visit | Attending: Physician Assistant | Admitting: Physician Assistant

## 2018-12-07 DIAGNOSIS — H903 Sensorineural hearing loss, bilateral: Secondary | ICD-10-CM | POA: Diagnosis not present

## 2018-12-07 MED ORDER — GADOBUTROL 1 MMOL/ML IV SOLN
7.0000 mL | Freq: Once | INTRAVENOUS | Status: AC | PRN
  Administered 2018-12-07: 7 mL via INTRAVENOUS

## 2018-12-09 DIAGNOSIS — J309 Allergic rhinitis, unspecified: Secondary | ICD-10-CM | POA: Diagnosis not present

## 2018-12-09 DIAGNOSIS — H60332 Swimmer's ear, left ear: Secondary | ICD-10-CM | POA: Diagnosis not present

## 2018-12-09 DIAGNOSIS — H903 Sensorineural hearing loss, bilateral: Secondary | ICD-10-CM | POA: Diagnosis not present

## 2018-12-17 ENCOUNTER — Other Ambulatory Visit: Payer: Medicare HMO

## 2018-12-18 ENCOUNTER — Ambulatory Visit: Payer: Medicare HMO | Admitting: Internal Medicine

## 2018-12-18 ENCOUNTER — Encounter: Payer: Self-pay | Admitting: Internal Medicine

## 2018-12-18 DIAGNOSIS — E1149 Type 2 diabetes mellitus with other diabetic neurological complication: Secondary | ICD-10-CM | POA: Diagnosis not present

## 2018-12-18 DIAGNOSIS — Z853 Personal history of malignant neoplasm of breast: Secondary | ICD-10-CM | POA: Diagnosis not present

## 2018-12-18 DIAGNOSIS — R05 Cough: Secondary | ICD-10-CM

## 2018-12-18 DIAGNOSIS — E7800 Pure hypercholesterolemia, unspecified: Secondary | ICD-10-CM | POA: Diagnosis not present

## 2018-12-18 DIAGNOSIS — R053 Chronic cough: Secondary | ICD-10-CM

## 2018-12-18 DIAGNOSIS — D508 Other iron deficiency anemias: Secondary | ICD-10-CM

## 2018-12-18 DIAGNOSIS — I10 Essential (primary) hypertension: Secondary | ICD-10-CM | POA: Diagnosis not present

## 2018-12-18 LAB — LIPID PANEL
Cholesterol: 164 mg/dL (ref 0–200)
HDL: 59.6 mg/dL (ref >39.00)
LDL Cholesterol: 95 mg/dL (ref 0–99)
NonHDL: 103.98
Total CHOL/HDL Ratio: 3
Triglycerides: 44 mg/dL (ref 0.0–149.0)
VLDL: 8.8 mg/dL (ref 0.0–40.0)

## 2018-12-18 LAB — BASIC METABOLIC PANEL
BUN: 16 mg/dL (ref 6–23)
CO2: 30 mEq/L (ref 19–32)
Calcium: 9.2 mg/dL (ref 8.4–10.5)
Chloride: 104 mEq/L (ref 96–112)
Creatinine, Ser: 0.75 mg/dL (ref 0.40–1.20)
GFR: 77.12 mL/min (ref >60.00)
Glucose, Bld: 127 mg/dL — ABNORMAL HIGH (ref 70–99)
Potassium: 3.8 mEq/L (ref 3.5–5.1)
Sodium: 140 mEq/L (ref 135–145)

## 2018-12-18 LAB — HEMOGLOBIN A1C: Hgb A1c MFr Bld: 6.7 % — ABNORMAL HIGH (ref 4.6–6.5)

## 2018-12-18 LAB — HEPATIC FUNCTION PANEL
ALT: 16 U/L (ref 0–35)
AST: 16 U/L (ref 0–37)
Albumin: 4.2 g/dL (ref 3.5–5.2)
Alkaline Phosphatase: 58 U/L (ref 39–117)
Bilirubin, Direct: 0.1 mg/dL (ref 0.0–0.3)
Total Bilirubin: 0.5 mg/dL (ref 0.2–1.2)
Total Protein: 6.3 g/dL (ref 6.0–8.3)

## 2018-12-18 LAB — TSH: TSH: 1.63 u[IU]/mL (ref 0.35–4.50)

## 2018-12-18 LAB — MICROALBUMIN / CREATININE URINE RATIO
Creatinine,U: 112.4 mg/dL
MICROALB/CREAT RATIO: 0.6 mg/g (ref 0.0–30.0)
Microalb, Ur: <0.7 mg/dL (ref 0.0–1.9)

## 2018-12-18 NOTE — Progress Notes (Signed)
Patient ID: Angel Floyd, female   DOB: [birth year], 68 y.o.   MRN: [id number]   Subjective:    Patient ID: Angel Floyd, female    DOB: 1950/12/28, 68 y.o.   MRN: [id number]  HPI  Patient here for a scheduled follow up.  Has had chronic cough.  Evaluated by pulmonary 12/03/18.  Instructed to continue protonix.  Given breo.  Instructed to use nasacort.  Also instructed to use guaifenesin-dextromethorphan.  Planning for allergy testing - scheduled for next week.  No headache.  Some decreased hearing in her left ear.  No ear pain.  Seeing ENT.  No acid reflux.  No abdominal pain.  Bowels moving.  Blood sugars:  130s.  No chest pain.  No sob.  Still with some persistent cough.  Blood pressures averaging 140-150/88-90.     Past Medical History:  Diagnosis Date  . Anemia   . Anginal pain ([place])   . Breast cancer ([place])   . Diabetes mellitus ([place])   . History of abnormal Pap smear    class III, required cryosurgery  . Hypercholesterolemia   . Hypertension   . TIA (transient ischemic attack)    Past Surgical History:  Procedure Laterality Date  . BREAST CYST ASPIRATION  1989  . COLONOSCOPY WITH PROPOFOL N/A 11/05/2017   Procedure: COLONOSCOPY WITH PROPOFOL;  Surgeon: Lollie Sails, MD;  Location: [facility] ENDOSCOPY;  Service: Endoscopy;  Laterality: N/A;  . DILATION AND CURETTAGE OF UTERUS    . ESOPHAGOGASTRODUODENOSCOPY (EGD) WITH PROPOFOL N/A 11/05/2017   Procedure: ESOPHAGOGASTRODUODENOSCOPY (EGD) WITH PROPOFOL;  Surgeon: Lollie Sails, MD;  Location: [facility] ENDOSCOPY;  Service: Endoscopy;  Laterality: N/A;  . FOOT SURGERY     morton's neuroma removed - right foot  . FOOT SURGERY  10/2009   achilles tendon release with reconstructive surgery  . GYNECOLOGIC CRYOSURGERY     class III pap  . HAND SURGERY     cyst removed - left hand  . hysteroscopy and D&C  7/09  . KNEE ARTHROSCOPY Left [id number]   removed tear & resurfaced area  . KNEE SURGERY  09/20/12   torn meniscus, (Dr Leanor Kail)  . TUBAL LIGATION     Family History  Problem Relation Age of Onset  . Lung cancer Father   . Alcohol abuse Father   . Cancer Father        lung  . CVA Mother   . Colon cancer Mother   . Alcohol abuse Mother   . Cancer Mother        colon  . Stroke Mother   . Colon cancer Sister   . Diabetes Maternal Grandfather    Social History   Socioeconomic History  . Marital status: Married    Spouse name: Not on file  . Number of children: 2  . Years of education: Not on file  . Highest education level: Not on file  Occupational History  . Not on file  Social Needs  . Financial resource strain: Not on file  . Food insecurity:    Worry: Not on file    Inability: Not on file  . Transportation needs:    Medical: Not on file    Non-medical: Not on file  Tobacco Use  . Smoking status: Never Smoker  . Smokeless tobacco: Never Used  Substance and Sexual Activity  . Alcohol use: No    Alcohol/week: 0.0 standard drinks  . Drug use: No  . Sexual activity: Not on file  Lifestyle  . Physical activity:    Days per week: Not on file    Minutes per session: Not on file  . Stress: Not on file  Relationships  . Social connections:    Talks on phone: Not on file    Gets together: Not on file    Attends religious service: Not on file    Active member of club or organization: Not on file    Attends meetings of clubs or organizations: Not on file    Relationship status: Not on file  Other Topics Concern  . Not on file  Social History Narrative  . Not on file    Outpatient Encounter Medications as of 12/18/2018  Medication Sig  . aspirin 81 MG tablet Take 81 mg by mouth daily.  . clopidogrel (PLAVIX) 75 MG tablet Take 1 tablet (75 mg total) by mouth daily.  . Fe Fum-FePoly-Vit C-Vit B3 (INTEGRA) 62.5-62.5-40-3 MG CAPS Take 1 capsule by mouth daily.  . fish oil-omega-3 fatty acids 1000 MG capsule Take 2 g by mouth daily.   . Fluticasone Furoate (ARNUITY ELLIPTA) 200  MCG/ACT AEPB Inhale 200 mcg into the lungs daily.  Marland Kitchen glucose blood test strip Use as instructed to check blood sugars three times daily. Has One Touch Ultra glucometer. Dx 250.00  . metFORMIN (GLUCOPHAGE) 500 MG tablet Take 1 tablet (500 mg total) by mouth daily.  . pantoprazole (PROTONIX) 40 MG tablet   . rosuvastatin (CRESTOR) 10 MG tablet Take 1 tablet (10 mg total) by mouth daily.  Marland Kitchen telmisartan (MICARDIS) 20 MG tablet Take 1 tablet (20 mg total) by mouth daily.  Marland Kitchen triamcinolone (NASACORT) 55 MCG/ACT AERO nasal inhaler Place 2 sprays into the nose daily.  . [DISCONTINUED] albuterol (PROVENTIL HFA;VENTOLIN HFA) 108 (90 Base) MCG/ACT inhaler Inhale 2 puffs into the lungs every 6 (six) hours as needed for wheezing or shortness of breath. or prn cough. Also, please instruct pt on proper way to use inhaler.  . [DISCONTINUED] Dextromethorphan-guaiFENesin 20-400 MG TABS Take 1 tablet by mouth 2 (two) times daily. Take on a schedule twice a day for the next week, then as needed thereafter  . [DISCONTINUED] fluticasone furoate-vilanterol (BREO ELLIPTA) 100-25 MCG/INH AEPB Inhale 1 puff into the lungs daily.   No facility-administered encounter medications on file as of 12/18/2018.     Review of Systems  Constitutional: Negative for appetite change and unexpected weight change.  HENT: Positive for hearing loss. Negative for congestion and sinus pressure.   Respiratory: Positive for cough. Negative for chest tightness and shortness of breath.   Cardiovascular: Negative for chest pain, palpitations and leg swelling.  Gastrointestinal: Negative for abdominal pain, diarrhea and nausea.  Genitourinary: Negative for difficulty urinating and dysuria.  Musculoskeletal: Negative for joint swelling and myalgias.  Skin: Negative for color change and rash.  Neurological: Negative for dizziness, light-headedness and headaches.  Psychiatric/Behavioral: Negative for agitation and dysphoric mood.         Objective:     Blood pressure rechecked by me:  Right - 136/88, left - 134/84  Physical Exam Constitutional:      General: She is not in acute distress.    Appearance: Normal appearance.  HENT:     Nose: Nose normal. No congestion.     Mouth/Throat:     Pharynx: No oropharyngeal exudate or posterior oropharyngeal erythema.  Neck:     Musculoskeletal: Neck supple. No muscular tenderness.     Thyroid: No thyromegaly.  Cardiovascular:  Rate and Rhythm: Normal rate and regular rhythm.  Pulmonary:     Effort: No respiratory distress.     Breath sounds: Normal breath sounds. No wheezing.  Abdominal:     General: Bowel sounds are normal.     Palpations: Abdomen is soft.     Tenderness: There is no abdominal tenderness.  Musculoskeletal:        General: No swelling or tenderness.  Lymphadenopathy:     Cervical: No cervical adenopathy.  Skin:    Findings: No erythema or rash.  Neurological:     Mental Status: She is alert.  Psychiatric:        Mood and Affect: Mood normal.        Behavior: Behavior normal.     BP 134/84   Pulse 70   Temp 97.8 F (36.6 C) (Oral)   Resp 16   Wt 156 lb 9.6 oz (71 kg)   SpO2 99%   BMI 26.06 kg/m  Wt Readings from Last 3 Encounters:  12/18/18 156 lb 9.6 oz (71 kg)  12/03/18 156 lb 6.4 oz (70.9 kg)  11/11/18 153 lb 9.6 oz (69.7 kg)     Lab Results  Component Value Date   WBC 5.9 10/31/2018   HGB 13.2 10/31/2018   HCT 39.4 10/31/2018   PLT 236.0 10/31/2018   GLUCOSE 127 (H) 12/18/2018   CHOL 164 12/18/2018   TRIG 44.0 12/18/2018   HDL 59.60 12/18/2018   LDLCALC 95 12/18/2018   ALT 16 12/18/2018   AST 16 12/18/2018   NA 140 12/18/2018   K 3.8 12/18/2018   CL 104 12/18/2018   CREATININE 0.75 12/18/2018   BUN 16 12/18/2018   CO2 30 12/18/2018   TSH 1.63 12/18/2018   INR 0.95 04/08/2017   HGBA1C 6.7 (H) 12/18/2018   MICROALBUR <0.7 12/18/2018    Mr Brain/iac W Wo Contrast  Result Date: 12/07/2018 CLINICAL DATA:   Sensorineural hearing loss bilateral EXAM: MRI HEAD WITHOUT AND WITH CONTRAST TECHNIQUE: Multiplanar, multiecho pulse sequences of the brain and surrounding structures were obtained without and with intravenous contrast. CONTRAST:  7 mL Gadovist IV COMPARISON:  MRI head 04/08/2017 FINDINGS: Brain: IAC protocol was performed including thin section imaging through the posterior fossa before and after intravenous contrast. Seventh and 8th cranial nerves are normal. Negative for vestibular schwannoma. Basilar cisterns normal. Brainstem and cerebellum normal. Mastoid sinus is clear bilaterally. No enhancing mass in the posterior fossa or temporal bone. Interval development of chronic white matter infarcts bilaterally involving the periventricular frontal white matter as well as the left external capsule anteriorly. Negative for hemorrhage or mass. Normal enhancement of the brain. Vascular: Normal arterial flow voids Skull and upper cervical spine: Negative Sinuses/Orbits: Mucosal edema throughout the paranasal sinuses. Bilateral mastoid effusion. Normal orbit Other: None IMPRESSION: No cause for hearing loss identified Chronic ischemic changes in the white matter have progressed since 2018 Extensive paranasal sinusitis and bilateral mastoid effusion with progression from the prior MRI. Electronically Signed   By: Franchot Gallo M.D.   On: 12/07/2018 14:27       Assessment & Plan:   Problem List Items Addressed This Visit    Anemia    Iron deficient.  Saw GI.  Had GI w/up.  Follow cbc and ferritin.        History of breast cancer    Mammogram 11/01/18 - Birads II.        Hypercholesterolemia    On crestor.  Low cholesterol diet and  exercise.  Follow lipid panel and liver function tests.        Hypertension    Blood pressure slightly elevated today.  Recheck improved.  Have her spot check her pressure.  Get her back in soon to reassess.  Follow metabolic panel.        Persistent cough    Saw  pulmonary.  Recommended Breo and nasacort.  Has allergy testing next week.  Continue f/u with pulmonary.        Type 2 diabetes mellitus with neurological complications (HCC)    Low carb diet and exercise.  Follow met b and a1c.            Einar Pheasant, MD

## 2018-12-21 ENCOUNTER — Encounter: Payer: Self-pay | Admitting: Internal Medicine

## 2018-12-21 NOTE — Assessment & Plan Note (Signed)
Blood pressure slightly elevated today.  Recheck improved.  Have her spot check her pressure.  Get her back in soon to reassess.  Follow metabolic panel.

## 2018-12-21 NOTE — Assessment & Plan Note (Signed)
Iron deficient.  Saw GI.  Had GI w/up.  Follow cbc and ferritin.

## 2018-12-21 NOTE — Assessment & Plan Note (Signed)
Low carb diet and exercise.  Follow met b and a1c.

## 2018-12-21 NOTE — Assessment & Plan Note (Signed)
Mammogram 11/01/18 - Birads II.

## 2018-12-21 NOTE — Assessment & Plan Note (Signed)
Saw pulmonary.  Recommended Breo and nasacort.  Has allergy testing next week.  Continue f/u with pulmonary.

## 2018-12-21 NOTE — Assessment & Plan Note (Signed)
On crestor.  Low cholesterol diet and exercise.  Follow lipid panel and liver function tests.   

## 2018-12-23 ENCOUNTER — Other Ambulatory Visit: Payer: Self-pay | Admitting: Pulmonary Disease

## 2018-12-23 MED ORDER — FLUTICASONE FUROATE-VILANTEROL 200-25 MCG/INH IN AEPB: 1.0000 | INHALATION_SPRAY | Freq: Every day | RESPIRATORY_TRACT | 5 refills | Status: DC

## 2018-12-24 DIAGNOSIS — J301 Allergic rhinitis due to pollen: Secondary | ICD-10-CM | POA: Diagnosis not present

## 2019-01-15 ENCOUNTER — Encounter: Payer: Self-pay | Admitting: Pulmonary Disease

## 2019-01-15 ENCOUNTER — Ambulatory Visit: Payer: Medicare HMO | Admitting: Pulmonary Disease

## 2019-01-15 VITALS — BP 132/80 | HR 74 | Ht 65.0 in | Wt 157.8 lb

## 2019-01-15 DIAGNOSIS — J310 Chronic rhinitis: Secondary | ICD-10-CM | POA: Diagnosis not present

## 2019-01-15 DIAGNOSIS — R05 Cough: Secondary | ICD-10-CM | POA: Diagnosis not present

## 2019-01-15 DIAGNOSIS — K219 Gastro-esophageal reflux disease without esophagitis: Secondary | ICD-10-CM | POA: Diagnosis not present

## 2019-01-15 DIAGNOSIS — J45909 Unspecified asthma, uncomplicated: Secondary | ICD-10-CM | POA: Diagnosis not present

## 2019-01-15 DIAGNOSIS — R053 Chronic cough: Secondary | ICD-10-CM

## 2019-01-15 MED ORDER — FLUTICASONE FUROATE-VILANTEROL 200-25 MCG/INH IN AEPB: 1.0000 | INHALATION_SPRAY | Freq: Every day | RESPIRATORY_TRACT | 5 refills | Status: DC

## 2019-01-15 NOTE — Patient Instructions (Signed)
Continue Breo inhaler, 1 inhalation daily.  Rinse mouth after use Continue Protonix at bedtime You may use Nasacort as needed for nasal congestion  Follow-up in 4-6 months.  Call sooner if needed

## 2019-01-15 NOTE — Progress Notes (Signed)
PULMONARY [facility] FOLLOW UP NOTE  Requesting MD/Service: Einar Pheasant, MD Date of initial consultation: 12/03/18 Reason for consultation: Chronic cough  PT PROFILE: 68 y.o. female never smoker referred for evaluation of chronic cough of 4 months duration  DATA:  INTERVAL: Initial visit 01/14 for chronic cough. Treated for all 3 components of cough triad - Breo, Nasacort, Pantoprazole.   SUBJ:  This is a scheduled follow-up.  Her cough is approximately 85% better.  She attributes the improvement to the [facility] inhaler.  She did switch to Arnuity but did not feel that this was as beneficial as Breo.  She used the Nasacort inhaler until her nasal symptoms resolved and has now discontinued it.  She remains on pantoprazole which she is taking at bedtime.  She has no new complaints.  She is pleased with the improvement.  She denies CP, fever, purulent sputum, hemoptysis, LE edema and calf tenderness.   Vitals:   01/15/19 0832 01/15/19 0839  BP:  132/80  Pulse:  74  SpO2:  97%  Weight: 157 lb 12.8 oz (71.6 kg)   Height: 5\' 5"  (1.651 m)   RA  EXAM:  Gen: NAD HEENT: NCAT, sclera white Neck: No JVD Lungs: breath sounds full, no wheezes or other adventitious sounds Cardiovascular: RRR, no murmurs Abdomen: Soft, nontender, normal BS Ext: without clubbing, cyanosis, edema Neuro: grossly intact Skin: Limited exam, no lesions noted    DATA:   BMP Latest Ref Rng & Units 12/18/2018 10/31/2018 08/12/2018  Glucose 70 - 99 mg/dL 127(H) 98 144(H)  BUN 6 - 23 mg/dL 16 13 17   Creatinine 0.40 - 1.20 mg/dL 0.75 0.77 0.73  Sodium 135 - 145 mEq/L 140 142 143  Potassium 3.5 - 5.1 mEq/L 3.8 3.7 4.1  Chloride 96 - 112 mEq/L 104 105 106  CO2 19 - 32 mEq/L 30 26 31   Calcium 8.4 - 10.5 mg/dL 9.2 9.0 9.3    CBC Latest Ref Rng & Units 10/31/2018 08/12/2018 04/08/2017  WBC 4.0 - 10.5 K/uL 5.9 4.7 5.2  Hemoglobin 12.0 - 15.0 g/dL 13.2 13.1 14.2  Hematocrit 36.0 - 46.0 % 39.4 38.5 40.9  Platelets 150.0 -  400.0 K/uL 236.0 173.0 192    CXR: No new film  I have personally reviewed all chest radiographs reported above including CXRs and CT chest unless otherwise indicated  IMPRESSION:     ICD-10-CM   1. Chronic cough , much improved R05   2. Chronic asthmatic bronchitis J45.909   3. Chronic rhinitis, much improved J31.0   4. Suspected GERD K21.9      PLAN:  Continue Breo inhaler, 1 inhalation daily.  Rinse mouth after use Continue Protonix at bedtime Continue Nasacort as needed for nasal congestion and rhinorrhea Follow-up in 4-6 months.  Call sooner if needed   Merton Border, MD PCCM service Mobile [phone] Pager [phone] 01/15/2019 10:18 AM

## 2019-01-16 DIAGNOSIS — D1800 Hemangioma unspecified site: Secondary | ICD-10-CM | POA: Diagnosis not present

## 2019-01-20 DIAGNOSIS — H60339 Swimmer's ear, unspecified ear: Secondary | ICD-10-CM | POA: Diagnosis not present

## 2019-01-20 DIAGNOSIS — J309 Allergic rhinitis, unspecified: Secondary | ICD-10-CM | POA: Diagnosis not present

## 2019-02-24 ENCOUNTER — Telehealth: Payer: Self-pay | Admitting: Internal Medicine

## 2019-02-24 DIAGNOSIS — E7800 Pure hypercholesterolemia, unspecified: Secondary | ICD-10-CM

## 2019-02-24 DIAGNOSIS — I10 Essential (primary) hypertension: Secondary | ICD-10-CM

## 2019-02-24 DIAGNOSIS — E1149 Type 2 diabetes mellitus with other diabetic neurological complication: Secondary | ICD-10-CM

## 2019-02-24 NOTE — Telephone Encounter (Signed)
Pt would like to have labs before her CPE on 08/18/2019

## 2019-02-24 NOTE — Telephone Encounter (Signed)
I can order labs for her if you want me too. Do you want anything additional added to the labs she had done last year?

## 2019-02-24 NOTE — Telephone Encounter (Signed)
I have ordered the labs, but she would be due for a f/u before 07/2019.  Is due 5-04/2019.  Thanks.

## 2019-02-26 ENCOUNTER — Ambulatory Visit: Payer: Medicare HMO | Admitting: Internal Medicine

## 2019-02-26 NOTE — Telephone Encounter (Signed)
I have rescheduled her follow up that she originally had scheduled for tomorrow with you but pt is going to wait on having labs. It is not time for her to have them done yet. I scheduled her for a fasting lab appt the week prior to her physical.

## 2019-02-27 ENCOUNTER — Ambulatory Visit (INDEPENDENT_AMBULATORY_CARE_PROVIDER_SITE_OTHER): Payer: Medicare HMO | Admitting: Internal Medicine

## 2019-02-27 DIAGNOSIS — R053 Chronic cough: Secondary | ICD-10-CM

## 2019-02-27 DIAGNOSIS — R05 Cough: Secondary | ICD-10-CM | POA: Diagnosis not present

## 2019-02-27 DIAGNOSIS — I10 Essential (primary) hypertension: Secondary | ICD-10-CM

## 2019-02-27 DIAGNOSIS — D508 Other iron deficiency anemias: Secondary | ICD-10-CM

## 2019-02-27 DIAGNOSIS — E7800 Pure hypercholesterolemia, unspecified: Secondary | ICD-10-CM | POA: Diagnosis not present

## 2019-02-27 DIAGNOSIS — E1149 Type 2 diabetes mellitus with other diabetic neurological complication: Secondary | ICD-10-CM

## 2019-03-01 ENCOUNTER — Encounter: Payer: Self-pay | Admitting: Internal Medicine

## 2019-03-01 NOTE — Assessment & Plan Note (Signed)
Saw pulmonary.  Using breo initially on a regular basis.  Doing well.  No increased cough now.  Now just using breo prn.  Follow.

## 2019-03-01 NOTE — Assessment & Plan Note (Signed)
Has a history of iron deficiency.  Had GI w/up.  Follow cbc and ferritin.

## 2019-03-01 NOTE — Progress Notes (Addendum)
Patient ID: Angel Floyd, female   DOB: 1951-07-24, 68 y.o.   MRN: [id number] Virtual Visit via video: Note  This visit type was conducted due to national recommendations for restrictions regarding the COVID-19 pandemic (e.g. social distancing).  This format is felt to be most appropriate for this patient at this time.  All issues noted in this document were discussed and addressed.  No physical exam was performed (except for noted visual exam findings with Video Visits).   I connected with Angel Floyd on 02/27/19 at  9:30 AM EDT by a video enabled telemedicine application and verified that I am speaking with the correct person using two identifiers. Location patient: home Location provider: work  Persons participating in the virtual visit: patient, provider  I discussed the limitations, risks, security and privacy concerns of performing an evaluation and management service by telephone and the availability of in person appointments. The patient expressed understanding and agreed to proceed.   Reason for visit: scheduled follow up.   HPI: She reports she is doing well.  Feels good.  Was having issues with chronic cough.  Saw Dr Alva Garnet.  Placed on Breo.  Cough is better.  Taking protonix in the evening.  No sob.  No chest pain.  Was seeing ENT for decreased hearing.  Had MRI - extensive paranasal sinusitis and bilateral mastoid eff.  Is better.  Continue f/u with ENT.  She is walking.  Regarding her diabetes, she is exercising and watching her diet.  Blood sugars averaging 130s (states < 135).  Blood pressures have been doing well.  Handling stress.  Discussed last labs.  a1c 6.7.     ROS: See pertinent positives and negatives per HPI.  Past Medical History:  Diagnosis Date  . Anemia   . Anginal pain ([place])   . Breast cancer ([place])   . Diabetes mellitus ([place])   . History of abnormal Pap smear    class III, required cryosurgery  . Hypercholesterolemia   . Hypertension   . TIA (transient  ischemic attack)     Past Surgical History:  Procedure Laterality Date  . BREAST CYST ASPIRATION  1989  . COLONOSCOPY WITH PROPOFOL N/A 11/05/2017   Procedure: COLONOSCOPY WITH PROPOFOL;  Surgeon: Lollie Sails, MD;  Location: [facility] ENDOSCOPY;  Service: Endoscopy;  Laterality: N/A;  . DILATION AND CURETTAGE OF UTERUS    . ESOPHAGOGASTRODUODENOSCOPY (EGD) WITH PROPOFOL N/A 11/05/2017   Procedure: ESOPHAGOGASTRODUODENOSCOPY (EGD) WITH PROPOFOL;  Surgeon: Lollie Sails, MD;  Location: [facility] ENDOSCOPY;  Service: Endoscopy;  Laterality: N/A;  . FOOT SURGERY     morton's neuroma removed - right foot  . FOOT SURGERY  10/2009   achilles tendon release with reconstructive surgery  . GYNECOLOGIC CRYOSURGERY     class III pap  . HAND SURGERY     cyst removed - left hand  . hysteroscopy and D&C  7/09  . KNEE ARTHROSCOPY Left [id number]   removed tear & resurfaced area  . KNEE SURGERY  09/20/12   torn meniscus, (Dr Leanor Kail)  . TUBAL LIGATION      Family History  Problem Relation Age of Onset  . Lung cancer Father   . Alcohol abuse Father   . Cancer Father        lung  . CVA Mother   . Colon cancer Mother   . Alcohol abuse Mother   . Cancer Mother        colon  . Stroke Mother   .  Colon cancer Sister   . Diabetes Maternal Grandfather     SOCIAL HX: reviewed.     Current Outpatient Medications:  .  aspirin 81 MG tablet, Take 81 mg by mouth daily., Disp: , Rfl:  .  clopidogrel (PLAVIX) 75 MG tablet, Take 1 tablet (75 mg total) by mouth daily., Disp: 90 tablet, Rfl: 1 .  Fe Fum-FePoly-Vit C-Vit B3 (INTEGRA) 62.5-62.5-40-3 MG CAPS, Take 1 capsule by mouth daily., Disp: 90 capsule, Rfl: 1 .  fish oil-omega-3 fatty acids 1000 MG capsule, Take 2 g by mouth daily. , Disp: , Rfl:  .  fluticasone furoate-vilanterol (BREO ELLIPTA) 200-25 MCG/INH AEPB, Inhale 1 puff into the lungs daily., Disp: 60 each, Rfl: 5 .  glucose blood test strip, Use as instructed to check blood sugars  three times daily. Has One Touch Ultra glucometer. Dx 250.00, Disp: 100 each, Rfl: 2 .  metFORMIN (GLUCOPHAGE) 500 MG tablet, Take 1 tablet (500 mg total) by mouth daily., Disp: 90 tablet, Rfl: 1 .  pantoprazole (PROTONIX) 40 MG tablet, , Disp: , Rfl:  .  rosuvastatin (CRESTOR) 10 MG tablet, Take 1 tablet (10 mg total) by mouth daily., Disp: 90 tablet, Rfl: 1 .  telmisartan (MICARDIS) 20 MG tablet, Take 1 tablet (20 mg total) by mouth daily., Disp: 90 tablet, Rfl: 2 .  triamcinolone (NASACORT) 55 MCG/ACT AERO nasal inhaler, Place 2 sprays into the nose daily., Disp: 1 Inhaler, Rfl: 5  EXAM:  VITALS:  Weight per her report:  153 pounds.    GENERAL: alert, oriented, appears well and in no acute distress  HEENT: atraumatic, conjunttiva clear, no obvious abnormalities on inspection of external nose  NECK: normal movements of the head and neck  LUNGS: on inspection no signs of respiratory distress, breathing rate appears normal, no obvious gross SOB, gasping or wheezing  CV: no obvious cyanosis  PSYCH/NEURO: pleasant and cooperative, no obvious depression or anxiety, speech and thought processing grossly intact  ASSESSMENT AND PLAN:  Discussed the following assessment and plan:  Other iron deficiency anemia  Hypercholesterolemia  Essential hypertension  Persistent cough  Type 2 diabetes mellitus with neurological complications (HCC)  Anemia Has a history of iron deficiency.  Had GI w/up.  Follow cbc and ferritin.    Hypercholesterolemia On crestor.  Low cholesterol diet and exercise.  Follow lipid panel and liver function tests.    Hypertension Blood pressure under good control.  Continue same medication regimen.  Follow pressures.  Follow metabolic panel.    Persistent cough Saw pulmonary.  Using breo initially on a regular basis.  Doing well.  No increased cough now.  Now just using breo prn.  Follow.    Type 2 diabetes mellitus with neurological complications (HCC) Is  walking.  Low carb diet and exercise.  Follow met b and a1c.      I discussed the assessment and treatment plan with the patient. The patient was provided an opportunity to ask questions and all were answered. The patient agreed with the plan and demonstrated an understanding of the instructions.   The patient was advised to call back or seek an in-person evaluation if the symptoms worsen or if the condition fails to improve as anticipated.   Einar Pheasant, MD

## 2019-03-01 NOTE — Assessment & Plan Note (Signed)
Is walking.  Low carb diet and exercise.  Follow met b and a1c.

## 2019-03-01 NOTE — Assessment & Plan Note (Signed)
On crestor.  Low cholesterol diet and exercise.  Follow lipid panel and liver function tests.   

## 2019-03-01 NOTE — Assessment & Plan Note (Signed)
Blood pressure under good control.  Continue same medication regimen.  Follow pressures.  Follow metabolic panel.   

## 2019-03-03 IMAGING — MR MR BRAIN/IAC WO/W
10 of 13 series · 27 of 48 positions shown · IV contrast (agent unspecified)
Comparison: MRI head 04/08/2017

CLINICAL DATA: Sensorineural hearing loss bilateral

EXAM:
MRI HEAD WITHOUT AND WITH CONTRAST
TECHNIQUE: Multiplanar, multiecho pulse sequences of the brain and surrounding
structures were obtained without and with intravenous contrast.
CONTRAST:  7 mL Gadovist IV

[Series 5: T1 · sagittal · 5.0mm · 0.62mm/px · 3 of 25 slices shown (1 of 3)]
[im 1/25]
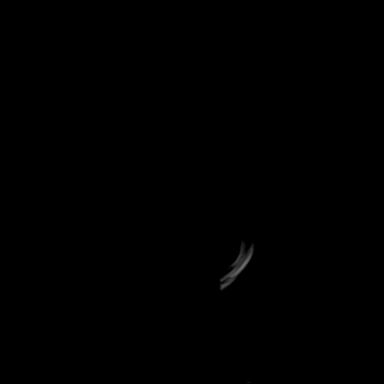
[im 13/25]
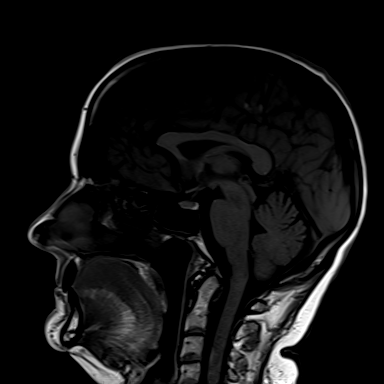
[im 25/25]
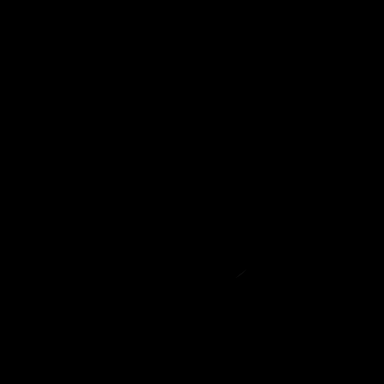

[Series 6: ax dwi_tracew · axial · 3.0mm · 0.73mm/px · z∈[-21,+138]mm · 4 of 54 slices shown]
[im 1/54]
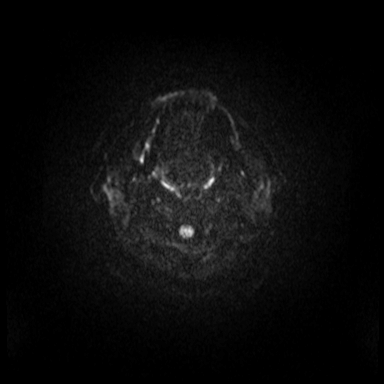
[im 18/54]
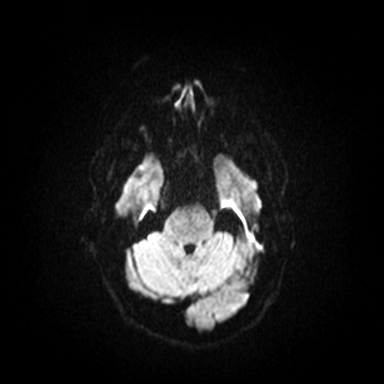
[im 36/54]
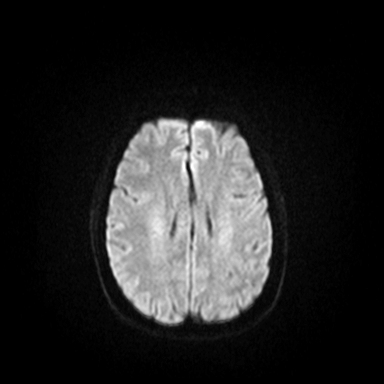
[im 54/54]
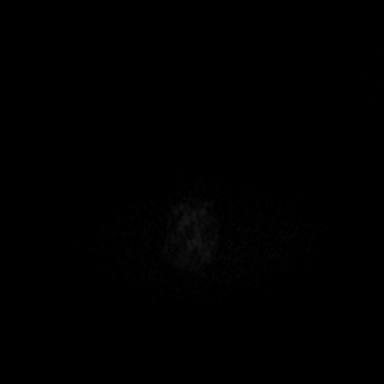

[Series 7: ax dwi_adc · axial · 3.0mm · 0.73mm/px · 1 of 53 slices shown]
[im 1/53]
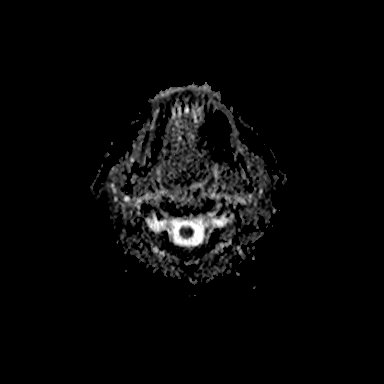

[Series 8: T2 · axial · 5.0mm · 0.53mm/px · z∈[-17,+138]mm · 2 of 27 slices shown]
[im 1/27]
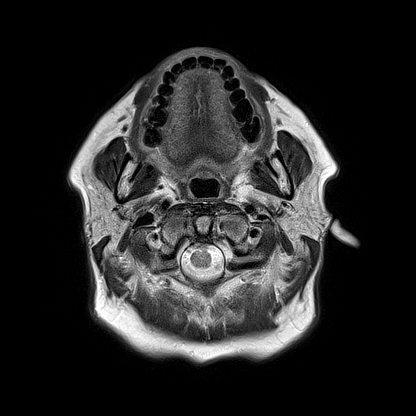
[im 27/27]
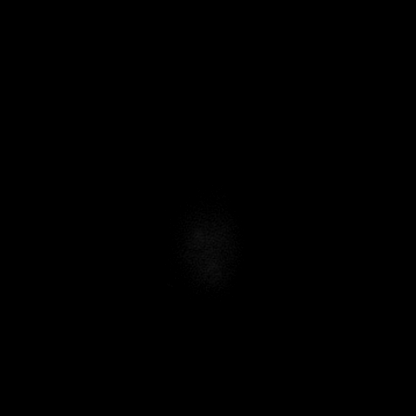

[Series 11: FLAIR · axial · 3.0mm · 0.53mm/px · z∈[-20,+141]mm · 4 of 55 slices shown]
[im 1/55]
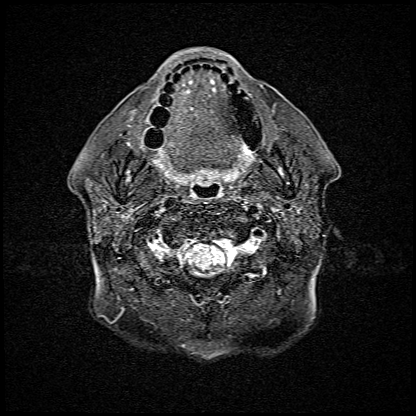
[im 19/55]
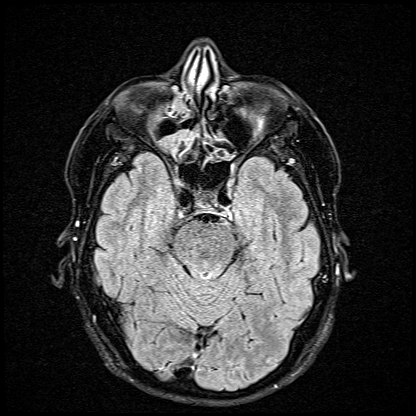
[im 37/55]
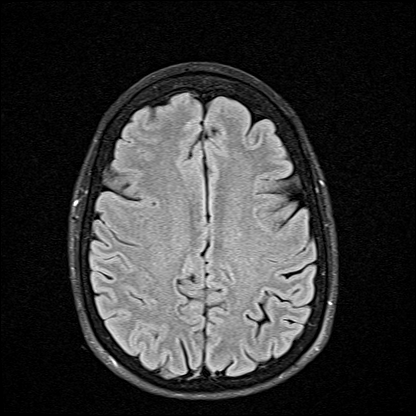
[im 55/55]
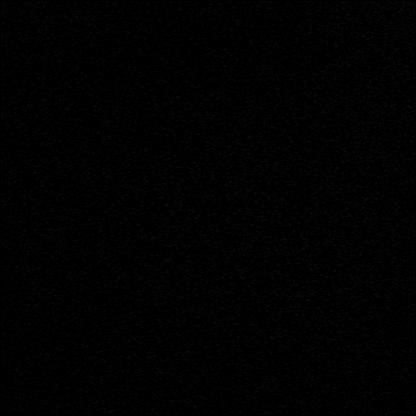

[Series 13: T1 · axial · non-contrast · 3.0mm · 0.21mm/px · 1 of 15 slices shown (2 of 3)]
[im 1/15]
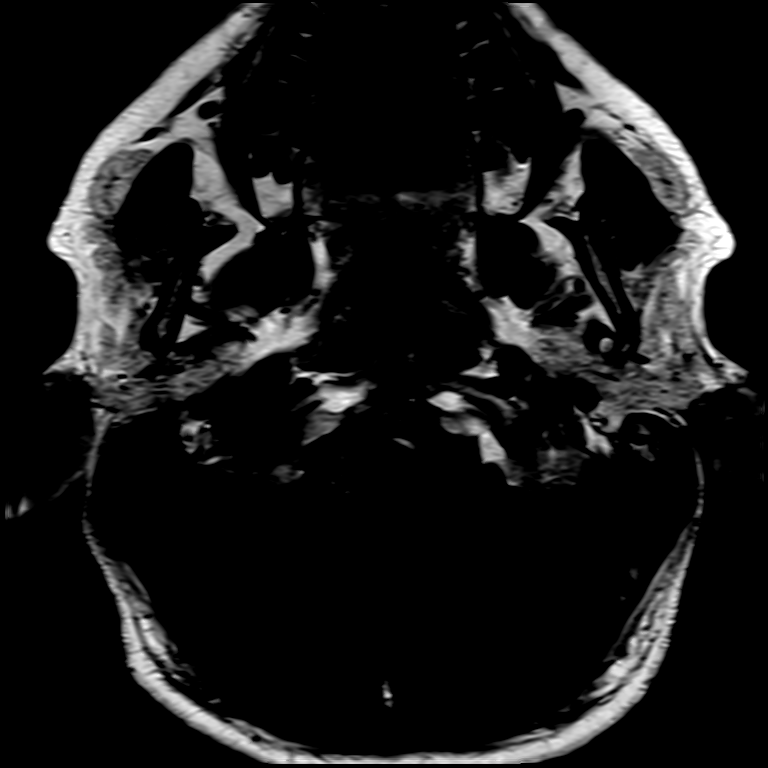

[Series 14: T1 · coronal · non-contrast · 3.0mm · 0.21mm/px · 1 of 13 slices shown (3 of 3)]
[im 1/13]
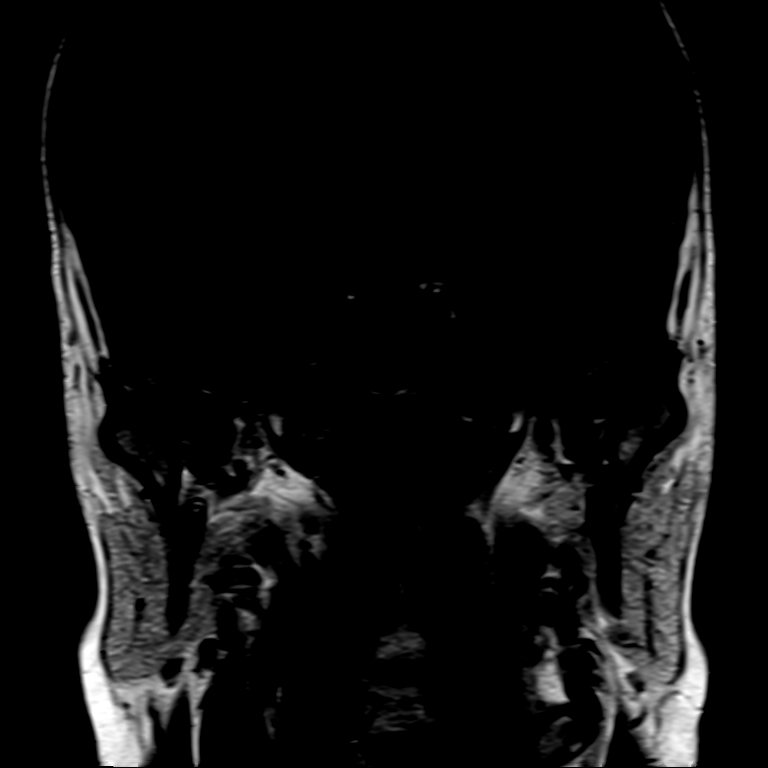

[Series 15: T1 post-contrast · axial · 3.0mm · 0.21mm/px · 1 of 15 slices shown (1 of 3)]
[im 1/15]
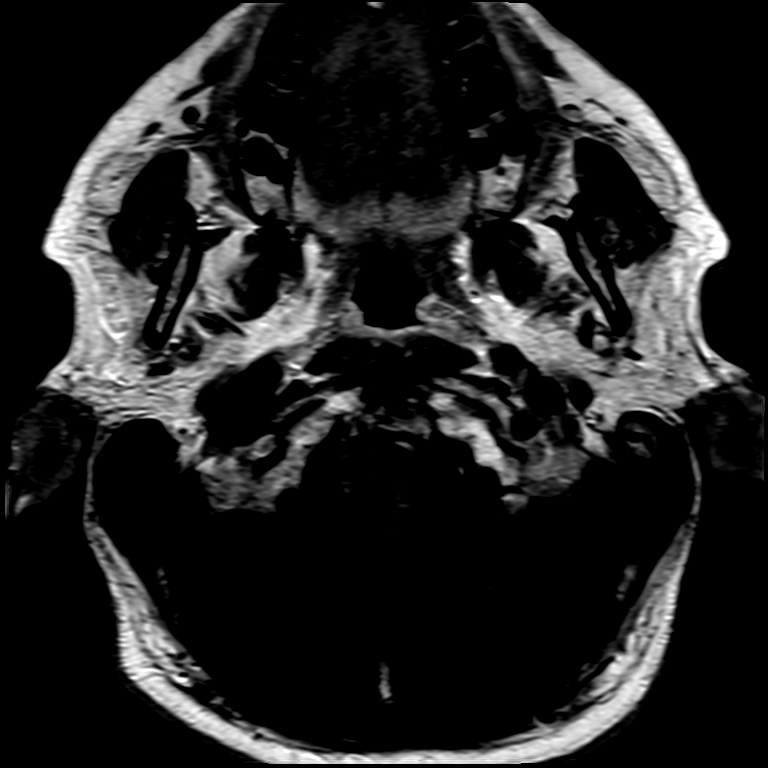

[Series 16: T1 post-contrast · coronal · 3.0mm · 0.21mm/px · 1 of 13 slices shown (2 of 3)]
[im 1/13]
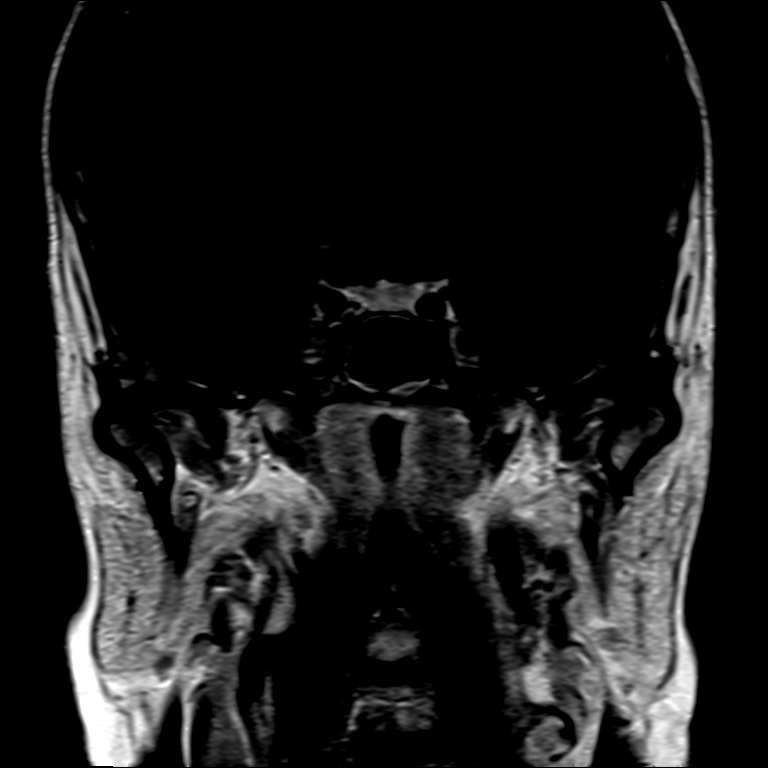

[Series 17: T1 post-contrast · axial · 1.0mm · 0.98mm/px · z∈[-26,+148]mm · 9 of 172 slices shown (3 of 3)]
[im 1/172]
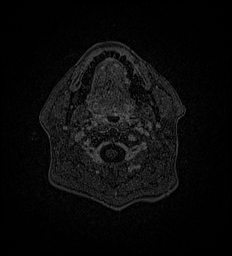
[im 29/172]
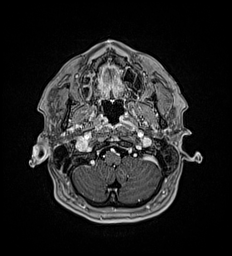
[im 58/172]
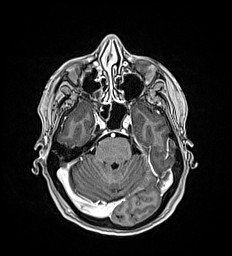
[im 72/172]
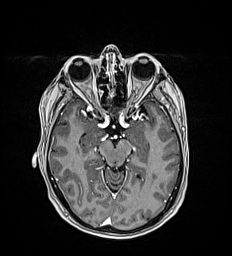
[im 86/172]
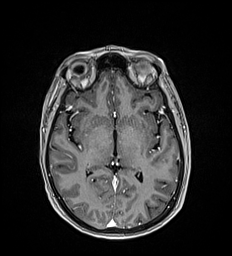
[im 100/172]
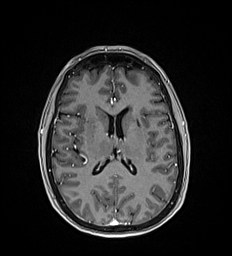
[im 115/172]
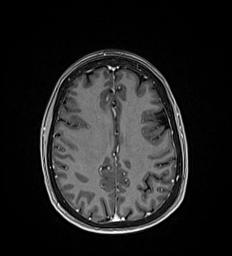
[im 143/172]
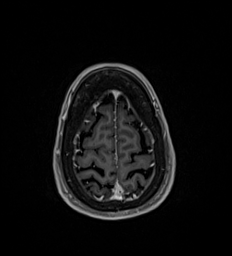
[im 172/172]
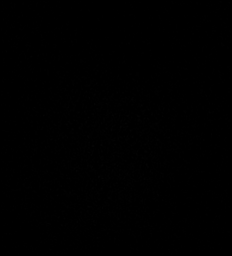

[27 of 48 positions shown; findings below may reference images not displayed]

FINDINGS: Brain: IAC protocol was performed including thin section imaging
through the posterior fossa before and after intravenous contrast.
Seventh and 8th cranial nerves are normal. Negative for vestibular
schwannoma. Basilar cisterns normal. Brainstem and cerebellum
normal. Mastoid sinus is clear bilaterally. No enhancing mass in the
posterior fossa or temporal bone.

Interval development of chronic white matter infarcts bilaterally
involving the periventricular frontal white matter as well as the
left external capsule anteriorly. Negative for hemorrhage or mass.
Normal enhancement of the brain.

Vascular: Normal arterial flow voids

Skull and upper cervical spine: Negative

Sinuses/Orbits: Mucosal edema throughout the paranasal sinuses.
Bilateral mastoid effusion. Normal orbit

Other: None
IMPRESSION: No cause for hearing loss identified

Chronic ischemic changes in the white matter have progressed since
6372

Extensive paranasal sinusitis and bilateral mastoid effusion with
progression from the prior MRI.

## 2019-03-06 ENCOUNTER — Other Ambulatory Visit: Payer: Self-pay | Admitting: Internal Medicine

## 2019-05-15 ENCOUNTER — Other Ambulatory Visit: Payer: Self-pay | Admitting: Internal Medicine

## 2019-06-09 ENCOUNTER — Other Ambulatory Visit: Payer: Self-pay

## 2019-06-09 ENCOUNTER — Ambulatory Visit (INDEPENDENT_AMBULATORY_CARE_PROVIDER_SITE_OTHER): Payer: Medicare HMO

## 2019-06-09 ENCOUNTER — Other Ambulatory Visit: Payer: Self-pay | Admitting: Internal Medicine

## 2019-06-09 DIAGNOSIS — Z0000 Encounter for general adult medical examination without abnormal findings: Secondary | ICD-10-CM

## 2019-06-09 MED ORDER — PANTOPRAZOLE SODIUM 40 MG PO TBEC: 40.0000 mg | DELAYED_RELEASE_TABLET | Freq: Every day | ORAL | 1 refills | Status: DC

## 2019-06-09 NOTE — Progress Notes (Signed)
Subjective:   Angel Floyd is a 68 y.o. female who presents for an Initial Medicare Annual Wellness Visit.  Review of Systems    No ROS.  Medicare Wellness Virtual Visit.  Visual/audio telehealth visit, UTA vital signs.   See social history for additional risk factors.  Cardiac Risk Factors include: advanced age (>41men, >27 women);diabetes mellitus;hypertension     Objective:    Today's Vitals   There is no height or weight on file to calculate BMI.  Advanced Directives 06/09/2019 04/08/2017 04/08/2017  Does Patient Have a Medical Advance Directive? Yes Yes Yes  Type of Paramedic of [place];Living will [place];Living will Living will;Healthcare Power of Attorney  Does patient want to make changes to medical advance directive? No - Patient declined No - Patient declined -  Copy of [place] in Chart? No - copy requested No - copy requested No - copy requested  Would patient like information on creating a medical advance directive? - No - Patient declined No - Patient declined    Current Medications (verified) Outpatient Encounter Medications as of 06/09/2019  Medication Sig  . aspirin 81 MG tablet Take 81 mg by mouth daily.  . clopidogrel (PLAVIX) 75 MG tablet TAKE 1 TABLET DAILY  . Fe Fum-FePoly-Vit C-Vit B3 (INTEGRA) 62.5-62.5-40-3 MG CAPS Take 1 capsule by mouth daily.  . fish oil-omega-3 fatty acids 1000 MG capsule Take 2 g by mouth daily.   . fluticasone furoate-vilanterol (BREO ELLIPTA) 200-25 MCG/INH AEPB Inhale 1 puff into the lungs daily.  Marland Kitchen glucose blood test strip Use as instructed to check blood sugars three times daily. Has One Touch Ultra glucometer. Dx 250.00  . metFORMIN (GLUCOPHAGE) 500 MG tablet TAKE 1 TABLET DAILY  . pantoprazole (PROTONIX) 40 MG tablet TAKE 1 TABLET DAILY 30     MINUTES BEFORE A MEAL.     MAKE AN APPOINTMENT FOR    FURTHER REFILLS  . rosuvastatin (CRESTOR) 10 MG tablet TAKE 1  TABLET DAILY  . telmisartan (MICARDIS) 20 MG tablet TAKE 1 TABLET DAILY  . [DISCONTINUED] pantoprazole (PROTONIX) 40 MG tablet   . [DISCONTINUED] triamcinolone (NASACORT) 55 MCG/ACT AERO nasal inhaler Place 2 sprays into the nose daily.   No facility-administered encounter medications on file as of 06/09/2019.     Allergies (verified) Aggrenox [aspirin-dipyridamole er]   History: Past Medical History:  Diagnosis Date  . Anemia   . Anginal pain ([place])   . Breast cancer ([place])   . Diabetes mellitus ([place])   . History of abnormal Pap smear    class III, required cryosurgery  . Hypercholesterolemia   . Hypertension   . TIA (transient ischemic attack)    Past Surgical History:  Procedure Laterality Date  . BREAST CYST ASPIRATION  1989  . COLONOSCOPY WITH PROPOFOL N/A 11/05/2017   Procedure: COLONOSCOPY WITH PROPOFOL;  Surgeon: Lollie Sails, MD;  Location: [facility] ENDOSCOPY;  Service: Endoscopy;  Laterality: N/A;  . DILATION AND CURETTAGE OF UTERUS    . ESOPHAGOGASTRODUODENOSCOPY (EGD) WITH PROPOFOL N/A 11/05/2017   Procedure: ESOPHAGOGASTRODUODENOSCOPY (EGD) WITH PROPOFOL;  Surgeon: Lollie Sails, MD;  Location: [facility] ENDOSCOPY;  Service: Endoscopy;  Laterality: N/A;  . FOOT SURGERY     morton's neuroma removed - right foot  . FOOT SURGERY  10/2009   achilles tendon release with reconstructive surgery  . GYNECOLOGIC CRYOSURGERY     class III pap  . HAND SURGERY     cyst removed -  left hand  . hysteroscopy and D&C  7/09  . KNEE ARTHROSCOPY Left [id number]   removed tear & resurfaced area  . KNEE SURGERY  09/20/12   torn meniscus, (Dr Leanor Kail)  . TUBAL LIGATION     Family History  Problem Relation Age of Onset  . Lung cancer Father   . Alcohol abuse Father   . Cancer Father        lung  . CVA Mother   . Colon cancer Mother   . Alcohol abuse Mother   . Cancer Mother        colon  . Stroke Mother   . Colon cancer Sister   . Diabetes Maternal Grandfather     Social History   Socioeconomic History  . Marital status: Married    Spouse name: Not on file  . Number of children: 2  . Years of education: Not on file  . Highest education level: Not on file  Occupational History  . Not on file  Social Needs  . Financial resource strain: Not hard at all  . Food insecurity    Worry: Never true    Inability: Never true  . Transportation needs    Medical: No    Non-medical: No  Tobacco Use  . Smoking status: Never Smoker  . Smokeless tobacco: Never Used  Substance and Sexual Activity  . Alcohol use: No    Alcohol/week: 0.0 standard drinks  . Drug use: No  . Sexual activity: Not on file  Lifestyle  . Physical activity    Days per week: Not on file    Minutes per session: Not on file  . Stress: Only a little  Relationships  . Social Herbalist on phone: Not on file    Gets together: Not on file    Attends religious service: Not on file    Active member of club or organization: Not on file    Attends meetings of clubs or organizations: Not on file    Relationship status: Not on file  Other Topics Concern  . Not on file  Social History Narrative  . Not on file    Tobacco Counseling Counseling given: Not Answered   Clinical Intake:  Pre-visit preparation completed: Yes        Diabetes: Yes(Followed by pcp)  How often do you need to have someone help you when you read instructions, pamphlets, or other written materials from your doctor or pharmacy?: 1 - Never  Interpreter Needed?: No      Activities of Daily Living In your present state of health, do you have any difficulty performing the following activities: 06/09/2019  Hearing? N  Vision? N  Difficulty concentrating or making decisions? N  Walking or climbing stairs? N  Dressing or bathing? N  Doing errands, shopping? N  Preparing Food and eating ? N  Using the Toilet? N  In the past six months, have you accidently leaked urine? N  Do you have  problems with loss of bowel control? N  Managing your Medications? N  Managing your Finances? N  Housekeeping or managing your Housekeeping? N  Some recent data might be hidden     Immunizations and Health Maintenance Immunization History  Administered Date(s) Administered  . Influenza Split 08/10/2012, 08/08/2013, 08/26/2014  . Influenza,inj,Quad PF,6+ Mos 08/11/2016  . Influenza-Unspecified 09/02/2015, 09/06/2017, 09/04/2018  . Pneumococcal Conjugate-13 02/11/2018  . Pneumococcal Polysaccharide-23 07/07/2013  . Tdap 07/03/2017  . Zoster 09/04/2013  . Zoster  Recombinat (Shingrix) 07/03/2017   Health Maintenance Due  Topic Date Due  . DEXA SCAN  02/23/2017  . FOOT EXAM  01/24/2019  . PNA vac Low Risk Adult (2 of 2 - PPSV23) 02/12/2019    Patient Care Team: Einar Pheasant, MD as PCP - General (Internal Medicine)  Indicate any recent Medical Services you may have received from other than [facility] providers in the past year (date may be approximate).     Assessment:   This is a routine wellness examination for Angel Floyd.  I connected with patient 06/09/19 at  1:00 PM EDT by an audio enabled telemedicine application and verified that I am speaking with the correct person using two identifiers. Patient stated full name and DOB. Patient gave permission to continue with virtual visit. Patient's location was at home and Nurse's location was at [place] office.   Requests refills for protonix be sent mail order. Next appointment scheduled September. Taking 1 daily. Reports 14 left.    Health Screenings  Mammogram - 09/2015 Colonoscopy - 10/2017 Bone Density - 09/2012. postponed per patient request.  Glaucoma -none Hearing -difficulty hearing. Followed by ENT. Demonstrates normal hearing during visit. Hemoglobin A1C -07/2018 Cholesterol - 11/2018 Dental- UTD Vision- visits within the last 12 months.  Social  Alcohol intake - no       Smoking history- never  Smokers in home? none  Illicit drug use? none Physical activity- yard work. No routine. Rides a bike while at the beach.  Diet - regular Sexually Active -not currently BMI- discussed the importance of a healthy diet, water intake and the benefits of aerobic exercise.  Educational material provided.   Safety  Patient feels safe at home- yes Patient does have smoke detectors at home- yes Patient does wear sunscreen or protective clothing when in direct sunlight -yes Patient does wear seat belt when in a moving vehicle -yes Patient drives- yes  [id number] precautions and sickness symptoms discussed.   Activities of Daily Living Patient denies needing assistance with: driving, household chores, feeding themselves, getting from bed to chair, getting to the toilet, bathing/showering, dressing, managing money, or preparing meals.  No new identified risk were noted.    Depression Screen Patient denies losing interest in daily life, feeling hopeless, or crying easily over simple problems.   Medication-taking as directed and without issues.   Fall Screen Patient denies being afraid of falling or falling in the last year.   Memory Screen Patient is alert.  Patient denies difficulty focusing, concentrating or misplacing items. Correctly identified the president of the [place], season and recall. Patient likes to knit for brain stimulation.  Immunizations The following Immunizations were discussed: Influenza, shingles, pneumonia, and tetanus.   Other Providers Patient Care Team: Einar Pheasant, MD as PCP - General (Internal Medicine)  Hearing/Vision screen  Hearing Screening   125Hz  250Hz  500Hz  1000Hz  2000Hz  3000Hz  4000Hz  6000Hz  8000Hz   Right ear:           Left ear:           Comments: Difficulty hearing. She does not wear hearing aids. Followed by ENT.   Vision Screening Comments: Wears corrective lenses Visual acuity not assessed, virtual visit.  They have seen their ophthalmologist in the last 12  months.    Dietary issues and exercise activities discussed: Current Exercise Habits: Home exercise routine, Time (Minutes): 15, Frequency (Times/Week): 2, Weekly Exercise (Minutes/Week): 30, Intensity: Mild  Goals      Patient Stated   . Increase physical activity (pt-stated)  Walk more for exercise Do more brain stimulating activities      Depression Screen PHQ 2/9 Scores 06/09/2019 09/20/2017 09/20/2017 08/11/2016 08/10/2015  PHQ - 2 Score 0 0 0 0 0  PHQ- 9 Score - 0 - - -    Fall Risk Fall Risk  06/09/2019 09/20/2017 08/11/2016 08/10/2015  Falls in the past year? 0 No No No   Cognitive Function:     6CIT Screen 06/09/2019  What Year? 0 points  What month? 0 points  What time? 0 points  Count back from 20 0 points  Months in reverse 0 points  Repeat phrase 0 points  Total Score 0    Screening Tests Health Maintenance  Topic Date Due  . DEXA SCAN  02/23/2017  . FOOT EXAM  01/24/2019  . PNA vac Low Risk Adult (2 of 2 - PPSV23) 02/12/2019  . HEMOGLOBIN A1C  06/18/2019  . INFLUENZA VACCINE  06/21/2019  . OPHTHALMOLOGY EXAM  09/27/2019  . MAMMOGRAM  10/27/2019  . COLONOSCOPY  11/05/2022  . TETANUS/TDAP  07/04/2027  . Hepatitis C Screening  Completed       Plan:    End of life planning; Advance aging; Advanced directives discussed.  Copy of current HCPOA/Living Will requested.    I have personally reviewed and noted the following in the patient's chart:   . Medical and social history . Use of alcohol, tobacco or illicit drugs  . Current medications and supplements . Functional ability and status . Nutritional status . Physical activity . Advanced directives . List of other physicians . Hospitalizations, surgeries, and ER visits in previous 12 months . Vitals . Screenings to include cognitive, depression, and falls . Referrals and appointments  In addition, I have reviewed and discussed with patient certain preventive protocols, quality metrics, and best  practice recommendations. A written personalized care plan for preventive services as well as general preventive health recommendations were provided to patient.     Varney Biles, LPN   7/98/9211    Reviewed above information.  Agree with assessment  and plan.    Dr Nicki Reaper

## 2019-06-09 NOTE — Patient Instructions (Addendum)
  Angel Floyd , Thank you for taking time to come for your Medicare Wellness Visit. I appreciate your ongoing commitment to your health goals. Please review the following plan we discussed and let me know if I can assist you in the future.   These are the goals we discussed: Goals      Patient Stated   . Increase physical activity (pt-stated)     Walk more for exercise Do more brain stimulating activities       This is a list of the screening recommended for you and due dates:  Health Maintenance  Topic Date Due  . DEXA scan (bone density measurement)  02/23/2017  . Complete foot exam   01/24/2019  . Pneumonia vaccines (2 of 2 - PPSV23) 02/12/2019  . Hemoglobin A1C  06/18/2019  . Flu Shot  06/21/2019  . Eye exam for diabetics  09/27/2019  . Mammogram  10/27/2019  . Colon Cancer Screening  11/05/2022  . Tetanus Vaccine  07/04/2027  .  Hepatitis C: One time screening is recommended by Center for Disease Control  (CDC) for  adults born from [birth year] through 1964.   Completed

## 2019-06-09 NOTE — Progress Notes (Signed)
rx sent to [facility].

## 2019-07-31 ENCOUNTER — Emergency Department: Admission: EM | Admit: 2019-07-31 | Discharge: 2019-07-31 | Discharge status: Absent without leave | Payer: Medicare HMO

## 2019-07-31 ENCOUNTER — Other Ambulatory Visit: Payer: Self-pay

## 2019-07-31 ENCOUNTER — Telehealth: Payer: Self-pay

## 2019-07-31 DIAGNOSIS — Z20822 Contact with and (suspected) exposure to covid-19: Secondary | ICD-10-CM

## 2019-07-31 DIAGNOSIS — Z20828 Contact with and (suspected) exposure to other viral communicable diseases: Secondary | ICD-10-CM

## 2019-07-31 DIAGNOSIS — R6889 Other general symptoms and signs: Secondary | ICD-10-CM | POA: Diagnosis not present

## 2019-07-31 NOTE — Telephone Encounter (Signed)
I placed the order for her to be covid tested. She is going today and scheduled for doxy tomorrow at 1:30. Patient is not having any symptoms at this time.

## 2019-08-01 ENCOUNTER — Ambulatory Visit (INDEPENDENT_AMBULATORY_CARE_PROVIDER_SITE_OTHER): Payer: Medicare HMO | Admitting: Internal Medicine

## 2019-08-01 ENCOUNTER — Encounter: Payer: Self-pay | Admitting: Internal Medicine

## 2019-08-01 DIAGNOSIS — Z20822 Contact with and (suspected) exposure to covid-19: Secondary | ICD-10-CM

## 2019-08-01 DIAGNOSIS — Z20828 Contact with and (suspected) exposure to other viral communicable diseases: Secondary | ICD-10-CM

## 2019-08-01 LAB — NOVEL CORONAVIRUS, NAA: SARS-CoV-2, NAA: NOT DETECTED

## 2019-08-01 NOTE — Progress Notes (Signed)
Patient ID: Angel Floyd, female   DOB: [birth year], 68 y.o.   MRN: [id number]   Virtual Visit via video Note  This visit type was conducted due to national recommendations for restrictions regarding the COVID-19 pandemic (e.g. social distancing).  This format is felt to be most appropriate for this patient at this time.  All issues noted in this document were discussed and addressed.  No physical exam was performed (except for noted visual exam findings with Video Visits).   I connected with Massie Polkowski by a video enabled telemedicine application and verified that I am speaking with the correct person using two identifiers. Location patient: home Location provider: work  Persons participating in the virtual visit: patient, provider  I discussed the limitations, risks, security and privacy concerns of performing an evaluation and management service by video and the availability of in person appointments.  The patient expressed understanding and agreed to proceed.   Reason for visit: work in appt.   HPI: States her husband started having symptoms 07/21/19.  Symptoms worsened and was taken to ER.  COVID positive.  Still with fever.  They are trying to social distance at home.  She had some minimal diarrhea this am.  She has had no fever.  No congestion, cough or sob.  No abdominal pain or vomiting.  Eating.  States she feels fine.  She was tested yesterday.  No results during visit.  Discussed her husband's symptoms.  Discussed quarantine.  They have been quarantined since he was diagnosed.     ROS: See pertinent positives and negatives per HPI.  Past Medical History:  Diagnosis Date  . Anemia   . Anginal pain ([place])   . Breast cancer ([place])   . Diabetes mellitus ([place])   . History of abnormal Pap smear    class III, required cryosurgery  . Hypercholesterolemia   . Hypertension   . TIA (transient ischemic attack)     Past Surgical History:  Procedure Laterality Date  . BREAST CYST  ASPIRATION  1989  . COLONOSCOPY WITH PROPOFOL N/A 11/05/2017   Procedure: COLONOSCOPY WITH PROPOFOL;  Surgeon: Lollie Sails, MD;  Location: [facility] ENDOSCOPY;  Service: Endoscopy;  Laterality: N/A;  . DILATION AND CURETTAGE OF UTERUS    . ESOPHAGOGASTRODUODENOSCOPY (EGD) WITH PROPOFOL N/A 11/05/2017   Procedure: ESOPHAGOGASTRODUODENOSCOPY (EGD) WITH PROPOFOL;  Surgeon: Lollie Sails, MD;  Location: [facility] ENDOSCOPY;  Service: Endoscopy;  Laterality: N/A;  . FOOT SURGERY     morton's neuroma removed - right foot  . FOOT SURGERY  10/2009   achilles tendon release with reconstructive surgery  . GYNECOLOGIC CRYOSURGERY     class III pap  . HAND SURGERY     cyst removed - left hand  . hysteroscopy and D&C  7/09  . KNEE ARTHROSCOPY Left [id number]   removed tear & resurfaced area  . KNEE SURGERY  09/20/12   torn meniscus, (Dr Leanor Kail)  . TUBAL LIGATION      Family History  Problem Relation Age of Onset  . Lung cancer Father   . Alcohol abuse Father   . Cancer Father        lung  . CVA Mother   . Colon cancer Mother   . Alcohol abuse Mother   . Cancer Mother        colon  . Stroke Mother   . Colon cancer Sister   . Diabetes Maternal Grandfather     SOCIAL HX: reviewed.    Current  Outpatient Medications:  .  aspirin 81 MG tablet, Take 81 mg by mouth daily., Disp: , Rfl:  .  clopidogrel (PLAVIX) 75 MG tablet, TAKE 1 TABLET DAILY, Disp: 90 tablet, Rfl: 1 .  Fe Fum-FePoly-Vit C-Vit B3 (INTEGRA) 62.5-62.5-40-3 MG CAPS, Take 1 capsule by mouth daily., Disp: 90 capsule, Rfl: 1 .  fish oil-omega-3 fatty acids 1000 MG capsule, Take 2 g by mouth daily. , Disp: , Rfl:  .  fluticasone furoate-vilanterol (BREO ELLIPTA) 200-25 MCG/INH AEPB, Inhale 1 puff into the lungs daily., Disp: 60 each, Rfl: 5 .  glucose blood test strip, Use as instructed to check blood sugars three times daily. Has One Touch Ultra glucometer. Dx 250.00, Disp: 100 each, Rfl: 2 .  metFORMIN (GLUCOPHAGE) 500  MG tablet, TAKE 1 TABLET DAILY, Disp: 90 tablet, Rfl: 1 .  pantoprazole (PROTONIX) 40 MG tablet, Take 1 tablet (40 mg total) by mouth daily., Disp: 90 tablet, Rfl: 1 .  rosuvastatin (CRESTOR) 10 MG tablet, TAKE 1 TABLET DAILY, Disp: 90 tablet, Rfl: 1 .  telmisartan (MICARDIS) 20 MG tablet, TAKE 1 TABLET DAILY, Disp: 90 tablet, Rfl: 2  EXAM:  GENERAL: alert, oriented, appears well and in no acute distress  HEENT: atraumatic, conjunttiva clear, no obvious abnormalities on inspection of external nose and ears  NECK: normal movements of the head and neck  LUNGS: on inspection no signs of respiratory distress, breathing rate appears normal, no obvious gross SOB, gasping or wheezing  CV: no obvious cyanosis  PSYCH/NEURO: pleasant and cooperative, no obvious depression or anxiety, speech and thought processing grossly intact  ASSESSMENT AND PLAN:  Discussed the following assessment and plan:  Close Exposure to Covid-19 Virus Discussed with her today.  Husband diagnosed with covid.  She had minimal diarrhea this am. No fever.  No cough or congestion.  No sob.  Tested yesterday. Results pending.  Continue social distancing in their home.  Discussed self quarantine.  They have been quarantined since husband diagnosed.  Call with update.      I discussed the assessment and treatment plan with the patient. The patient was provided an opportunity to ask questions and all were answered. The patient agreed with the plan and demonstrated an understanding of the instructions.   The patient was advised to call back or seek an in-person evaluation if the symptoms worsen or if the condition fails to improve as anticipated.   Einar Pheasant, MD

## 2019-08-03 ENCOUNTER — Encounter: Payer: Self-pay | Admitting: Internal Medicine

## 2019-08-03 DIAGNOSIS — Z20822 Contact with and (suspected) exposure to covid-19: Secondary | ICD-10-CM | POA: Insufficient documentation

## 2019-08-03 DIAGNOSIS — Z20828 Contact with and (suspected) exposure to other viral communicable diseases: Secondary | ICD-10-CM | POA: Insufficient documentation

## 2019-08-03 NOTE — Assessment & Plan Note (Signed)
Discussed with her today.  Husband diagnosed with covid.  She had minimal diarrhea this am. No fever.  No cough or congestion.  No sob.  Tested yesterday. Results pending.  Continue social distancing in their home.  Discussed self quarantine.  They have been quarantined since husband diagnosed.  Call with update.

## 2019-08-05 ENCOUNTER — Ambulatory Visit: Payer: Self-pay | Admitting: *Deleted

## 2019-08-05 DIAGNOSIS — R0602 Shortness of breath: Secondary | ICD-10-CM | POA: Diagnosis not present

## 2019-08-05 DIAGNOSIS — J208 Acute bronchitis due to other specified organisms: Secondary | ICD-10-CM | POA: Diagnosis not present

## 2019-08-05 DIAGNOSIS — R1013 Epigastric pain: Secondary | ICD-10-CM | POA: Diagnosis not present

## 2019-08-05 DIAGNOSIS — U071 COVID-19: Secondary | ICD-10-CM

## 2019-08-05 DIAGNOSIS — R05 Cough: Secondary | ICD-10-CM | POA: Diagnosis not present

## 2019-08-05 DIAGNOSIS — Z20828 Contact with and (suspected) exposure to other viral communicable diseases: Secondary | ICD-10-CM | POA: Diagnosis not present

## 2019-08-05 HISTORY — DX: COVID-19: U07.1

## 2019-08-05 NOTE — Telephone Encounter (Signed)
Attempted to reach patient. Phone was busy 

## 2019-08-05 NOTE — Telephone Encounter (Signed)
Called pt due to symptoms; hard to take a deep breath due to chest tightness which started on 08/02/2019;  She also complains of fatigue, nausea, inability to eat; the pt says that she has lost 10 lbs; her CBG was 116 on 08/03/2019 and 163 on 08/04/2019; recommendations made per nurse triage protocol; she verbalized understanding but does not want to go to the ED; the pt sees Dr Einar Pheasant, [facility]; pt transferred to [facility] for scheduling if approved by provider; will route to office for notification.  Reason for Disposition . [1] MODERATE difficulty breathing (e.g., speaks in phrases, SOB even at rest, pulse 100-120) AND [2] NEW-onset or WORSE than normal  Answer Assessment - Initial Assessment Questions 1. RESPIRATORY STATUS: "Describe your breathing?" (e.g., wheezing, shortness of breath, unable to speak, severe coughing)      Chest tightness 2. ONSET: "When did this breathing problem begin?"     08/02/2019 3. PATTERN "Does the difficult breathing come and go, or has it been constant since it started?"      intermittent 4. SEVERITY: "How bad is your breathing?" (e.g., mild, moderate, severe)    - MILD: No SOB at rest, mild SOB with walking, speaks normally in sentences, can lay down, no retractions, pulse < 100.    - MODERATE: SOB at rest, SOB with minimal exertion and prefers to sit, cannot lie down flat, speaks in phrases, mild retractions, audible wheezing, pulse 100-120.    - SEVERE: Very SOB at rest, speaks in single words, struggling to breathe, sitting hunched forward, retractions, pulse > 120      moderate 5. RECURRENT SYMPTOM: "Have you had difficulty breathing before?" If so, ask: "When was the last time?" and "What happened that time?"     no 6. CARDIAC HISTORY: "Do you have any history of heart disease?" (e.g., heart attack, angina, bypass surgery, angioplasty)      HTN, heart murmer 7. LUNG HISTORY: "Do you have any history of lung disease?"  (e.g., pulmonary embolus,  asthma, emphysema)     Asthmatic bronchitis  8. CAUSE: "What do you think is causing the breathing problem?"      Not sure; pt tested negative for COVID but her husband was posititive 9. OTHER SYMPTOMS: "Do you have any other symptoms? (e.g., dizziness, runny nose, cough, chest pain, fever)   Fatigue, unable to eat/nausea; weight loss 10. PREGNANCY: "Is there any chance you are pregnant?" "When was your last menstrual period?"       no 11. TRAVEL: "Have you traveled out of the country in the last month?" (e.g., travel history, exposures)      no  Protocols used: BREATHING DIFFICULTY-A-AH

## 2019-08-05 NOTE — Telephone Encounter (Signed)
Reviewed message.  It appears pt went to Acute Care at [facility].  Please call and confirm pt doing ok.

## 2019-08-05 NOTE — Telephone Encounter (Signed)
Spoke to patient after call was transferred from [facility] triage nurse.  Patient stated that her husband had tested positive for COVID recently.  Patient said that at first she felt fine but over the past few days she said that she has started to feel bad.  Pt c/o of SOB, chest tightness, productive cough, mucus, increased use of inhaler.  Recommended patient go to ED but patient declined and started crying stating that she had lost her sister recently after she had to take her to the ER.  Patient said that it would be difficult for her to return there.  Recommended patient to go to an urgent care.  Recommended [facility].  Gave patient number to [facility].  Patient was agreeable to going and said that she felt ok to drive herself.  Patient plans on going there this  morning.

## 2019-08-06 ENCOUNTER — Other Ambulatory Visit: Payer: Self-pay

## 2019-08-06 ENCOUNTER — Emergency Department
Admission: EM | Admit: 2019-08-06 | Discharge: 2019-08-06 | Disposition: A | Discharge status: Home / Self Care | Payer: Medicare HMO | Attending: Student | Admitting: Student

## 2019-08-06 ENCOUNTER — Emergency Department: Payer: Medicare HMO

## 2019-08-06 DIAGNOSIS — R0602 Shortness of breath: Secondary | ICD-10-CM | POA: Diagnosis not present

## 2019-08-06 DIAGNOSIS — Z7982 Long term (current) use of aspirin: Secondary | ICD-10-CM | POA: Diagnosis not present

## 2019-08-06 DIAGNOSIS — Z7901 Long term (current) use of anticoagulants: Secondary | ICD-10-CM | POA: Diagnosis not present

## 2019-08-06 DIAGNOSIS — Z7984 Long term (current) use of oral hypoglycemic drugs: Secondary | ICD-10-CM | POA: Insufficient documentation

## 2019-08-06 DIAGNOSIS — Z853 Personal history of malignant neoplasm of breast: Secondary | ICD-10-CM | POA: Insufficient documentation

## 2019-08-06 DIAGNOSIS — J189 Pneumonia, unspecified organism: Secondary | ICD-10-CM | POA: Diagnosis not present

## 2019-08-06 DIAGNOSIS — I10 Essential (primary) hypertension: Secondary | ICD-10-CM | POA: Diagnosis not present

## 2019-08-06 DIAGNOSIS — E119 Type 2 diabetes mellitus without complications: Secondary | ICD-10-CM | POA: Diagnosis not present

## 2019-08-06 DIAGNOSIS — Z8673 Personal history of transient ischemic attack (TIA), and cerebral infarction without residual deficits: Secondary | ICD-10-CM | POA: Diagnosis not present

## 2019-08-06 DIAGNOSIS — U071 COVID-19: Secondary | ICD-10-CM | POA: Insufficient documentation

## 2019-08-06 DIAGNOSIS — Z79899 Other long term (current) drug therapy: Secondary | ICD-10-CM | POA: Insufficient documentation

## 2019-08-06 DIAGNOSIS — R0902 Hypoxemia: Secondary | ICD-10-CM | POA: Diagnosis not present

## 2019-08-06 DIAGNOSIS — R069 Unspecified abnormalities of breathing: Secondary | ICD-10-CM | POA: Diagnosis not present

## 2019-08-06 LAB — CBC WITH DIFFERENTIAL/PLATELET
Abs Immature Granulocytes: 0.02 10*3/uL (ref 0.00–0.07)
Basophils Absolute: 0 10*3/uL (ref 0.0–0.1)
Basophils Relative: 0 %
Eosinophils Absolute: 0 10*3/uL (ref 0.0–0.5)
Eosinophils Relative: 0 %
HCT: 40.4 % (ref 36.0–46.0)
Hemoglobin: 13.8 g/dL (ref 12.0–15.0)
Immature Granulocytes: 0 %
Lymphocytes Relative: 14 %
Lymphs Abs: 0.9 10*3/uL (ref 0.7–4.0)
MCH: 29.3 pg (ref 26.0–34.0)
MCHC: 34.2 g/dL (ref 30.0–36.0)
MCV: 85.8 fL (ref 80.0–100.0)
Monocytes Absolute: 0.4 10*3/uL (ref 0.1–1.0)
Monocytes Relative: 6 %
Neutro Abs: 5 10*3/uL (ref 1.7–7.7)
Neutrophils Relative %: 80 %
Platelets: 148 10*3/uL — ABNORMAL LOW (ref 150–400)
RBC: 4.71 MIL/uL (ref 3.87–5.11)
RDW: 11.7 % (ref 11.5–15.5)
WBC: 6.3 10*3/uL (ref 4.0–10.5)
nRBC: 0 % (ref 0.0–0.2)

## 2019-08-06 LAB — COMPREHENSIVE METABOLIC PANEL
ALT: 23 U/L (ref 0–44)
AST: 28 U/L (ref 15–41)
Albumin: 4 g/dL (ref 3.5–5.0)
Alkaline Phosphatase: 61 U/L (ref 38–126)
Anion gap: 14 (ref 5–15)
BUN: 18 mg/dL (ref 8–23)
CO2: 27 mmol/L (ref 22–32)
Calcium: 9.2 mg/dL (ref 8.9–10.3)
Chloride: 98 mmol/L (ref 98–111)
Creatinine, Ser: 0.66 mg/dL (ref 0.44–1.00)
GFR calc Af Amer: >60 mL/min (ref >60)
GFR calc non Af Amer: >60 mL/min (ref >60)
Glucose, Bld: 123 mg/dL — ABNORMAL HIGH (ref 70–99)
Potassium: 3.3 mmol/L — ABNORMAL LOW (ref 3.5–5.1)
Sodium: 139 mmol/L (ref 135–145)
Total Bilirubin: 0.9 mg/dL (ref 0.3–1.2)
Total Protein: 7.6 g/dL (ref 6.5–8.1)

## 2019-08-06 LAB — PROCALCITONIN: Procalcitonin: <0.1 ng/mL

## 2019-08-06 LAB — LACTIC ACID, PLASMA: Lactic Acid, Venous: 1.6 mmol/L (ref 0.5–1.9)

## 2019-08-06 MED ORDER — SODIUM CHLORIDE 0.9 % IV BOLUS
500.0000 mL | Freq: Once | INTRAVENOUS | Status: AC
  Administered 2019-08-06: 500 mL via INTRAVENOUS

## 2019-08-06 MED ORDER — ACETAMINOPHEN 500 MG PO TABS
1000.0000 mg | ORAL_TABLET | Freq: Once | ORAL | Status: AC
  Administered 2019-08-06: 1000 mg via ORAL
  Filled 2019-08-06: qty 2

## 2019-08-06 NOTE — ED Provider Notes (Signed)
[facility] Emergency Department Provider Note  ____________________________________________   First MD Initiated Contact with Patient 08/06/19 2023     (approximate)  I have reviewed the triage vital signs and the nursing notes.  History  Chief Complaint Shortness of Breath    HPI Angel Floyd is a 68 y.o. female history of diabetes, hypertension, hypercholesterolemia who presents to the emergency department for shortness of breath.  Known COVID positive.  Positive result on 9/15.  She reports start of her symptoms on 9/11, including generalized fatigue, shortness of breath. She reports decreased appetite as well.  No vomiting, diarrhea, or abdominal pain.    She was seen in the clinic yesterday, where chest x-ray was concerning for possible early pneumonia, and was started on antibiotics for this.  Today, she was called with her COVID results.  She began to feel more short of breath, called her primary care doctor, who instructed her to seek care in the emergency department.  She states she has been measuring her oxygen at home, the lowest oxygen she has had is 90%.  She has no history of COPD or asthma.        Past Medical Hx Past Medical History:  Diagnosis Date  . Anemia   . Anginal pain ([place])   . Breast cancer ([place])   . Diabetes mellitus ([place])   . History of abnormal Pap smear    class III, required cryosurgery  . Hypercholesterolemia   . Hypertension   . TIA (transient ischemic attack)     Problem List Patient Active Problem List   Diagnosis Date Noted  . Close Exposure to Covid-19 Virus 08/03/2019  . Persistent cough 11/17/2018  . Chest pain 10/13/2015  . Rash and nonspecific skin eruption 06/15/2015  . Health care maintenance 04/08/2015  . Carotid bruit 12/12/2014  . Left leg pain 07/12/2014  . Change in bowel function 07/12/2014  . Hypertension 10/10/2012  . Hypercholesterolemia 10/10/2012  . Type 2 diabetes mellitus with  neurological complications ([place]) 123XX123  . TIA (transient ischemic attack) 10/10/2012  . Anemia 10/10/2012  . History of breast cancer 10/10/2012    Past Surgical Hx Past Surgical History:  Procedure Laterality Date  . BREAST CYST ASPIRATION  1989  . COLONOSCOPY WITH PROPOFOL N/A 11/05/2017   Procedure: COLONOSCOPY WITH PROPOFOL;  Surgeon: Lollie Sails, MD;  Location: [facility] ENDOSCOPY;  Service: Endoscopy;  Laterality: N/A;  . DILATION AND CURETTAGE OF UTERUS    . ESOPHAGOGASTRODUODENOSCOPY (EGD) WITH PROPOFOL N/A 11/05/2017   Procedure: ESOPHAGOGASTRODUODENOSCOPY (EGD) WITH PROPOFOL;  Surgeon: Lollie Sails, MD;  Location: [facility] ENDOSCOPY;  Service: Endoscopy;  Laterality: N/A;  . FOOT SURGERY     morton's neuroma removed - right foot  . FOOT SURGERY  10/2009   achilles tendon release with reconstructive surgery  . GYNECOLOGIC CRYOSURGERY     class III pap  . HAND SURGERY     cyst removed - left hand  . hysteroscopy and D&C  7/09  . KNEE ARTHROSCOPY Left [id number]   removed tear & resurfaced area  . KNEE SURGERY  09/20/12   torn meniscus, (Dr Leanor Kail)  . TUBAL LIGATION      Medications Prior to Admission medications   Medication Sig Start Date End Date Taking? Authorizing Provider  aspirin 81 MG tablet Take 81 mg by mouth daily.    [provider]  clopidogrel (PLAVIX) 75 MG tablet TAKE 1 TABLET DAILY 03/06/19   Einar Pheasant, MD  Fe  Fum-FePoly-Vit C-Vit B3 (INTEGRA) 62.5-62.5-40-3 MG CAPS Take 1 capsule by mouth daily. 04/27/17   Einar Pheasant, MD  fish oil-omega-3 fatty acids 1000 MG capsule Take 2 g by mouth daily.     [provider]  fluticasone furoate-vilanterol (BREO ELLIPTA) 200-25 MCG/INH AEPB Inhale 1 puff into the lungs daily. 01/15/19   Wilhelmina Mcardle, MD  glucose blood test strip Use as instructed to check blood sugars three times daily. Has One Touch Ultra glucometer. Dx 250.00 01/23/18   Einar Pheasant, MD  metFORMIN  (GLUCOPHAGE) 500 MG tablet TAKE 1 TABLET DAILY 05/16/19   Einar Pheasant, MD  pantoprazole (PROTONIX) 40 MG tablet Take 1 tablet (40 mg total) by mouth daily. 06/09/19   Einar Pheasant, MD  rosuvastatin (CRESTOR) 10 MG tablet TAKE 1 TABLET DAILY 05/16/19   Einar Pheasant, MD  telmisartan (MICARDIS) 20 MG tablet TAKE 1 TABLET DAILY 05/16/19   Einar Pheasant, MD    Allergies Aggrenox [aspirin-dipyridamole er]  Family Hx Family History  Problem Relation Age of Onset  . Lung cancer Father   . Alcohol abuse Father   . Cancer Father        lung  . CVA Mother   . Colon cancer Mother   . Alcohol abuse Mother   . Cancer Mother        colon  . Stroke Mother   . Colon cancer Sister   . Diabetes Maternal Grandfather     Social Hx Social History   Tobacco Use  . Smoking status: Never Smoker  . Smokeless tobacco: Never Used  Substance Use Topics  . Alcohol use: No    Alcohol/week: 0.0 standard drinks  . Drug use: No     Review of Systems  Constitutional: + for fever. Negative for chills. Eyes: Negative for visual changes. ENT: Negative for sore throat. Cardiovascular: Negative for chest pain. Respiratory: + for shortness of breath. Gastrointestinal: Negative for abdominal pain. Negative for nausea. Negative for vomiting. Genitourinary: Negative for dysuria. Musculoskeletal: Negative for leg swelling. Skin: Negative for rash. Neurological: Negative for for headaches.   Physical Exam  Vital Signs: ED Triage Vitals  Enc Vitals Group     BP 08/06/19 2022 (!) 152/58     Pulse Rate 08/06/19 2022 78     Resp 08/06/19 2022 20     Temp 08/06/19 2022 (!) 100.6 F (38.1 C)     Temp Source 08/06/19 2022 Oral     SpO2 08/06/19 2022 97 %     Weight 08/06/19 2024 145 lb (65.8 kg)     Height 08/06/19 2024 5\' 5"  (1.651 m)     Head Circumference --      Peak Flow --      Pain Score 08/06/19 2022 8     Pain Loc --      Pain Edu? --      Excl. in [place]? --     Constitutional:  Alert and oriented.  Appears fatigued. Eyes: Conjunctivae clear. Sclera anicteric. Head: Normocephalic. Atraumatic. Nose: No congestion. No rhinorrhea. Neck: No stridor.   Cardiovascular: Normal rate, regular rhythm. Extremities well perfused. Respiratory: Normal respiratory effort.  Oxygen mid 90s and above.  Speaking in full sentences. Gastrointestinal: Soft. No distention.  Musculoskeletal: No lower extremity edema. Neurologic:  Normal speech and language. No gross focal neurologic deficits are appreciated.  Skin: Skin is warm, dry and intact. No rash noted. Psychiatric: Mood and affect are appropriate for situation.  EKG  Personally reviewed.  Rate: 74 Rhythm: sinus Axis: normal Intervals: WNL No acute ischemic changes No STEMI    Radiology  XR: IMPRESSION: 1. Low inspiratory volumes. 2. Stable borderline cardiomegaly and pulmonary vascular congestion without overt edema.     Procedures  Procedure(s) performed (including critical care):  Procedures   Initial Impression / Assessment and Plan / ED Course  68 y.o. female who presents to the ED for shortness of breath, fatigue in the setting of known COVID positive.  Ddx: likely related to her known COVID diagnosis, consider superimposed infection as well  Plan: labs, imaging, symptom control and reassess.  At this time she does not require supplemental oxygen.  X-ray without lobar consolidation.  Lab work without actionable derangements.  Lactic acid within normal limits, negative procalcitonin - this makes superimposed bacterial infection far less likely.  On reassessment, she is feeling improved.  Her temperature has improved with antipyretics.  She has not required any supplemental oxygen.  Discussed results with patient.  Given she is feeling improved, her oxygen has remained stable, > 92%, without the need for supplemental oxygen for either hypoxia nor work of breathing, will plan for discharge home.   Advised continued supportive care.  Discussed strict return precautions as well as hygiene and quarantining precautions.  She voices understanding is comfortable to plan and discharge.   Final Clinical Impression(s) / ED Diagnosis  Final diagnoses:  SOB (shortness of breath)  COVID-19       Note:  This document was prepared using Dragon voice recognition software and may include unintentional dictation errors.   Lilia Pro., MD 08/06/19 [phone]

## 2019-08-06 NOTE — ED Triage Notes (Signed)
Pt went to walk-in clinic where she was diagnosed with upper left lobe pneumonia. Pt tested positive for COVID. Pt states husband also tested positive for COVID. Pt monitors oxygen at home and noticed a drop from 96% to 90% on room air. Pt states she has chest pain with big inspiration and low back pain that is new. Pt is experiencing SOB.

## 2019-08-06 NOTE — Discharge Instructions (Addendum)
Thank you for letting us take care of you in the emergency department today.   Please continue to take any regular, prescribed medications.   Please continue to check your vital signs at home.  Please try to stay well-hydrated, and continue to eat a healthy diet.  Please return to the emergency department for any new or worsening symptoms, including increased weakness, inability to care for yourself, dehydration, vomiting or diarrhea, increased difficulty breathing.  Please continue to follow the appropriate hygiene and quarantining precautions.

## 2019-08-07 NOTE — Telephone Encounter (Signed)
Pt was ed last night

## 2019-08-10 ENCOUNTER — Encounter (HOSPITAL_COMMUNITY): Payer: Self-pay

## 2019-08-10 ENCOUNTER — Inpatient Hospital Stay (HOSPITAL_COMMUNITY)
Admission: AD | Admit: 2019-08-10 | Discharge: 2019-08-14 | DRG: 177 | Disposition: A | Discharge status: Home / Self Care | Payer: Medicare HMO | Source: Other Acute Inpatient Hospital | Attending: Internal Medicine | Admitting: Internal Medicine

## 2019-08-10 ENCOUNTER — Emergency Department
Admission: EM | Admit: 2019-08-10 | Discharge: 2019-08-10 | Disposition: A | Discharge status: Other Acute Inpatient Hospital | Payer: Medicare HMO | Attending: Emergency Medicine | Admitting: Emergency Medicine

## 2019-08-10 ENCOUNTER — Other Ambulatory Visit: Payer: Self-pay

## 2019-08-10 ENCOUNTER — Emergency Department: Payer: Medicare HMO

## 2019-08-10 ENCOUNTER — Encounter: Payer: Self-pay | Admitting: Emergency Medicine

## 2019-08-10 DIAGNOSIS — E119 Type 2 diabetes mellitus without complications: Secondary | ICD-10-CM | POA: Diagnosis not present

## 2019-08-10 DIAGNOSIS — R0902 Hypoxemia: Secondary | ICD-10-CM | POA: Diagnosis not present

## 2019-08-10 DIAGNOSIS — J45909 Unspecified asthma, uncomplicated: Secondary | ICD-10-CM | POA: Diagnosis not present

## 2019-08-10 DIAGNOSIS — Z7952 Long term (current) use of systemic steroids: Secondary | ICD-10-CM

## 2019-08-10 DIAGNOSIS — E1165 Type 2 diabetes mellitus with hyperglycemia: Secondary | ICD-10-CM | POA: Diagnosis present

## 2019-08-10 DIAGNOSIS — Z800 Family history of malignant neoplasm of digestive organs: Secondary | ICD-10-CM | POA: Diagnosis not present

## 2019-08-10 DIAGNOSIS — Z801 Family history of malignant neoplasm of trachea, bronchus and lung: Secondary | ICD-10-CM | POA: Diagnosis not present

## 2019-08-10 DIAGNOSIS — I10 Essential (primary) hypertension: Secondary | ICD-10-CM | POA: Insufficient documentation

## 2019-08-10 DIAGNOSIS — Z7982 Long term (current) use of aspirin: Secondary | ICD-10-CM | POA: Insufficient documentation

## 2019-08-10 DIAGNOSIS — Z7901 Long term (current) use of anticoagulants: Secondary | ICD-10-CM | POA: Diagnosis not present

## 2019-08-10 DIAGNOSIS — Z7984 Long term (current) use of oral hypoglycemic drugs: Secondary | ICD-10-CM | POA: Diagnosis not present

## 2019-08-10 DIAGNOSIS — R740 Nonspecific elevation of levels of transaminase and lactic acid dehydrogenase [LDH]: Secondary | ICD-10-CM | POA: Diagnosis not present

## 2019-08-10 DIAGNOSIS — Z7951 Long term (current) use of inhaled steroids: Secondary | ICD-10-CM | POA: Diagnosis not present

## 2019-08-10 DIAGNOSIS — J1282 Pneumonia due to coronavirus disease 2019: Secondary | ICD-10-CM | POA: Diagnosis present

## 2019-08-10 DIAGNOSIS — R918 Other nonspecific abnormal finding of lung field: Secondary | ICD-10-CM | POA: Diagnosis not present

## 2019-08-10 DIAGNOSIS — J9601 Acute respiratory failure with hypoxia: Secondary | ICD-10-CM | POA: Diagnosis present

## 2019-08-10 DIAGNOSIS — J9600 Acute respiratory failure, unspecified whether with hypoxia or hypercapnia: Secondary | ICD-10-CM | POA: Diagnosis not present

## 2019-08-10 DIAGNOSIS — Z8673 Personal history of transient ischemic attack (TIA), and cerebral infarction without residual deficits: Secondary | ICD-10-CM | POA: Insufficient documentation

## 2019-08-10 DIAGNOSIS — J1289 Other viral pneumonia: Secondary | ICD-10-CM

## 2019-08-10 DIAGNOSIS — Z833 Family history of diabetes mellitus: Secondary | ICD-10-CM | POA: Diagnosis not present

## 2019-08-10 DIAGNOSIS — U071 COVID-19: Principal | ICD-10-CM | POA: Diagnosis present

## 2019-08-10 DIAGNOSIS — Z79899 Other long term (current) drug therapy: Secondary | ICD-10-CM | POA: Diagnosis not present

## 2019-08-10 DIAGNOSIS — R05 Cough: Secondary | ICD-10-CM | POA: Diagnosis not present

## 2019-08-10 DIAGNOSIS — T380X5A Adverse effect of glucocorticoids and synthetic analogues, initial encounter: Secondary | ICD-10-CM | POA: Diagnosis present

## 2019-08-10 DIAGNOSIS — E1149 Type 2 diabetes mellitus with other diabetic neurological complication: Secondary | ICD-10-CM | POA: Diagnosis present

## 2019-08-10 DIAGNOSIS — J189 Pneumonia, unspecified organism: Secondary | ICD-10-CM | POA: Diagnosis not present

## 2019-08-10 DIAGNOSIS — R7401 Elevation of levels of liver transaminase levels: Secondary | ICD-10-CM | POA: Diagnosis present

## 2019-08-10 DIAGNOSIS — R0602 Shortness of breath: Secondary | ICD-10-CM | POA: Diagnosis not present

## 2019-08-10 DIAGNOSIS — E876 Hypokalemia: Secondary | ICD-10-CM | POA: Diagnosis present

## 2019-08-10 DIAGNOSIS — E7800 Pure hypercholesterolemia, unspecified: Secondary | ICD-10-CM | POA: Diagnosis present

## 2019-08-10 DIAGNOSIS — J1281 Pneumonia due to SARS-associated coronavirus: Secondary | ICD-10-CM | POA: Insufficient documentation

## 2019-08-10 DIAGNOSIS — Z7902 Long term (current) use of antithrombotics/antiplatelets: Secondary | ICD-10-CM

## 2019-08-10 DIAGNOSIS — Z853 Personal history of malignant neoplasm of breast: Secondary | ICD-10-CM

## 2019-08-10 LAB — CBC WITH DIFFERENTIAL/PLATELET
Abs Immature Granulocytes: 0.01 10*3/uL (ref 0.00–0.07)
Basophils Absolute: 0 10*3/uL (ref 0.0–0.1)
Basophils Relative: 0 %
Eosinophils Absolute: 0 10*3/uL (ref 0.0–0.5)
Eosinophils Relative: 0 %
HCT: 40.6 % (ref 36.0–46.0)
Hemoglobin: 13.5 g/dL (ref 12.0–15.0)
Immature Granulocytes: 0 %
Lymphocytes Relative: 27 %
Lymphs Abs: 1.1 10*3/uL (ref 0.7–4.0)
MCH: 29 pg (ref 26.0–34.0)
MCHC: 33.3 g/dL (ref 30.0–36.0)
MCV: 87.3 fL (ref 80.0–100.0)
Monocytes Absolute: 0.3 10*3/uL (ref 0.1–1.0)
Monocytes Relative: 8 %
Neutro Abs: 2.5 10*3/uL (ref 1.7–7.7)
Neutrophils Relative %: 65 %
Platelets: 244 10*3/uL (ref 150–400)
RBC: 4.65 MIL/uL (ref 3.87–5.11)
RDW: 11.5 % (ref 11.5–15.5)
WBC: 4 10*3/uL (ref 4.0–10.5)
nRBC: 0 % (ref 0.0–0.2)

## 2019-08-10 LAB — COMPREHENSIVE METABOLIC PANEL
ALT: 124 U/L — ABNORMAL HIGH (ref 0–44)
AST: 68 U/L — ABNORMAL HIGH (ref 15–41)
Albumin: 3.3 g/dL — ABNORMAL LOW (ref 3.5–5.0)
Alkaline Phosphatase: 100 U/L (ref 38–126)
Anion gap: 11 (ref 5–15)
BUN: 22 mg/dL (ref 8–23)
CO2: 30 mmol/L (ref 22–32)
Calcium: 8.9 mg/dL (ref 8.9–10.3)
Chloride: 98 mmol/L (ref 98–111)
Creatinine, Ser: 0.6 mg/dL (ref 0.44–1.00)
GFR calc Af Amer: >60 mL/min (ref >60)
GFR calc non Af Amer: >60 mL/min (ref >60)
Glucose, Bld: 117 mg/dL — ABNORMAL HIGH (ref 70–99)
Potassium: 3 mmol/L — ABNORMAL LOW (ref 3.5–5.1)
Sodium: 139 mmol/L (ref 135–145)
Total Bilirubin: 1 mg/dL (ref 0.3–1.2)
Total Protein: 7.2 g/dL (ref 6.5–8.1)

## 2019-08-10 LAB — GLUCOSE, CAPILLARY
Glucose-Capillary: 100 mg/dL — ABNORMAL HIGH (ref 70–99)
Glucose-Capillary: 224 mg/dL — ABNORMAL HIGH (ref 70–99)

## 2019-08-10 LAB — PROCALCITONIN: Procalcitonin: <0.1 ng/mL

## 2019-08-10 LAB — TROPONIN I (HIGH SENSITIVITY): Troponin I (High Sensitivity): 10 ng/L (ref <18)

## 2019-08-10 MED ORDER — ONDANSETRON HCL 4 MG PO TABS: 4.0000 mg | ORAL_TABLET | Freq: Four times a day (QID) | ORAL | Status: DC | PRN

## 2019-08-10 MED ORDER — ENOXAPARIN SODIUM 40 MG/0.4ML ~~LOC~~ SOLN
40.0000 mg | SUBCUTANEOUS | Status: DC
  Administered 2019-08-10 – 2019-08-13 (×4): 40 mg via SUBCUTANEOUS
  Filled 2019-08-10 (×4): qty 0.4

## 2019-08-10 MED ORDER — POTASSIUM CHLORIDE CRYS ER 20 MEQ PO TBCR
40.0000 meq | EXTENDED_RELEASE_TABLET | Freq: Once | ORAL | Status: AC
  Administered 2019-08-10: 40 meq via ORAL
  Filled 2019-08-10: qty 2

## 2019-08-10 MED ORDER — ALBUTEROL SULFATE HFA 108 (90 BASE) MCG/ACT IN AERS
2.0000 | INHALATION_SPRAY | RESPIRATORY_TRACT | Status: DC | PRN
  Filled 2019-08-10: qty 6.7

## 2019-08-10 MED ORDER — PANTOPRAZOLE SODIUM 40 MG PO TBEC
40.0000 mg | DELAYED_RELEASE_TABLET | Freq: Every day | ORAL | Status: DC
  Administered 2019-08-10 – 2019-08-13 (×4): 40 mg via ORAL
  Filled 2019-08-10 (×4): qty 1

## 2019-08-10 MED ORDER — SODIUM CHLORIDE 0.9 % IV SOLN
1.0000 g | Freq: Once | INTRAVENOUS | Status: AC
  Administered 2019-08-10: 1 g via INTRAVENOUS
  Filled 2019-08-10: qty 10

## 2019-08-10 MED ORDER — INSULIN ASPART 100 UNIT/ML ~~LOC~~ SOLN
0.0000 [IU] | Freq: Every day | SUBCUTANEOUS | Status: DC
  Administered 2019-08-10 – 2019-08-11 (×2): 2 [IU] via SUBCUTANEOUS
  Administered 2019-08-12 – 2019-08-13 (×2): 3 [IU] via SUBCUTANEOUS

## 2019-08-10 MED ORDER — AZITHROMYCIN 500 MG PO TABS
500.0000 mg | ORAL_TABLET | Freq: Once | ORAL | Status: AC
  Administered 2019-08-10: 03:00:00 500 mg via ORAL
  Filled 2019-08-10: qty 1

## 2019-08-10 MED ORDER — SODIUM CHLORIDE 0.9% FLUSH
3.0000 mL | Freq: Two times a day (BID) | INTRAVENOUS | Status: DC
  Administered 2019-08-11 – 2019-08-13 (×6): 3 mL via INTRAVENOUS

## 2019-08-10 MED ORDER — POLYETHYLENE GLYCOL 3350 17 G PO PACK: 17.0000 g | PACK | Freq: Every day | ORAL | Status: DC | PRN

## 2019-08-10 MED ORDER — HYDROCOD POLST-CPM POLST ER 10-8 MG/5ML PO SUER: 5.0000 mL | Freq: Two times a day (BID) | ORAL | Status: DC | PRN

## 2019-08-10 MED ORDER — SODIUM CHLORIDE 0.9 % IV SOLN
100.0000 mg | INTRAVENOUS | Status: DC
  Administered 2019-08-11 – 2019-08-13 (×3): 100 mg via INTRAVENOUS
  Filled 2019-08-10 (×3): qty 20

## 2019-08-10 MED ORDER — DOCUSATE SODIUM 100 MG PO CAPS
100.0000 mg | ORAL_CAPSULE | Freq: Two times a day (BID) | ORAL | Status: DC
  Administered 2019-08-10 – 2019-08-13 (×6): 100 mg via ORAL
  Filled 2019-08-10 (×7): qty 1

## 2019-08-10 MED ORDER — ACETAMINOPHEN 325 MG PO TABS: 650.0000 mg | ORAL_TABLET | Freq: Four times a day (QID) | ORAL | Status: DC | PRN

## 2019-08-10 MED ORDER — SODIUM CHLORIDE 0.9% FLUSH
3.0000 mL | INTRAVENOUS | Status: DC | PRN
  Administered 2019-08-13: 3 mL via INTRAVENOUS
  Filled 2019-08-10: qty 3

## 2019-08-10 MED ORDER — BENZONATATE 100 MG PO CAPS: 200.0000 mg | ORAL_CAPSULE | Freq: Three times a day (TID) | ORAL | Status: DC | PRN

## 2019-08-10 MED ORDER — ENSURE ENLIVE PO LIQD
237.0000 mL | Freq: Two times a day (BID) | ORAL | Status: DC
  Administered 2019-08-11 – 2019-08-12 (×3): 237 mL via ORAL

## 2019-08-10 MED ORDER — ONDANSETRON HCL 4 MG/2ML IJ SOLN: 4.0000 mg | Freq: Four times a day (QID) | INTRAMUSCULAR | Status: DC | PRN

## 2019-08-10 MED ORDER — CLOPIDOGREL BISULFATE 75 MG PO TABS
75.0000 mg | ORAL_TABLET | Freq: Every day | ORAL | Status: DC
  Administered 2019-08-10 – 2019-08-14 (×5): 75 mg via ORAL
  Filled 2019-08-10 (×5): qty 1

## 2019-08-10 MED ORDER — SODIUM CHLORIDE 0.9 % IV SOLN: 250.0000 mL | INTRAVENOUS | Status: DC | PRN

## 2019-08-10 MED ORDER — SODIUM CHLORIDE 0.9 % IV SOLN
200.0000 mg | Freq: Once | INTRAVENOUS | Status: AC
  Administered 2019-08-10: 200 mg via INTRAVENOUS
  Filled 2019-08-10: qty 40

## 2019-08-10 MED ORDER — GUAIFENESIN-DM 100-10 MG/5ML PO SYRP: 10.0000 mL | ORAL_SOLUTION | ORAL | Status: DC | PRN

## 2019-08-10 MED ORDER — INSULIN ASPART 100 UNIT/ML ~~LOC~~ SOLN
0.0000 [IU] | Freq: Three times a day (TID) | SUBCUTANEOUS | Status: DC
  Administered 2019-08-11: 4 [IU] via SUBCUTANEOUS
  Administered 2019-08-11: 7 [IU] via SUBCUTANEOUS
  Administered 2019-08-11: 4 [IU] via SUBCUTANEOUS
  Administered 2019-08-12: 7 [IU] via SUBCUTANEOUS
  Administered 2019-08-12 (×2): 4 [IU] via SUBCUTANEOUS
  Administered 2019-08-13: 7 [IU] via SUBCUTANEOUS
  Administered 2019-08-13: 11 [IU] via SUBCUTANEOUS
  Administered 2019-08-14: 3 [IU] via SUBCUTANEOUS
  Administered 2019-08-14: 7 [IU] via SUBCUTANEOUS

## 2019-08-10 MED ORDER — FLUTICASONE FUROATE-VILANTEROL 200-25 MCG/INH IN AEPB
1.0000 | INHALATION_SPRAY | Freq: Every day | RESPIRATORY_TRACT | Status: DC
  Administered 2019-08-10 – 2019-08-14 (×5): 1 via RESPIRATORY_TRACT
  Filled 2019-08-10: qty 28

## 2019-08-10 MED ORDER — ROSUVASTATIN CALCIUM 5 MG PO TABS
10.0000 mg | ORAL_TABLET | Freq: Every day | ORAL | Status: DC
  Administered 2019-08-10 – 2019-08-14 (×5): 10 mg via ORAL
  Filled 2019-08-10 (×5): qty 2

## 2019-08-10 MED ORDER — POTASSIUM CHLORIDE 20 MEQ PO PACK
40.0000 meq | PACK | Freq: Once | ORAL | Status: AC
  Administered 2019-08-10: 03:00:00 40 meq via ORAL
  Filled 2019-08-10: qty 2

## 2019-08-10 MED ORDER — ASPIRIN EC 81 MG PO TBEC
81.0000 mg | DELAYED_RELEASE_TABLET | Freq: Every day | ORAL | Status: DC
  Administered 2019-08-10 – 2019-08-14 (×5): 81 mg via ORAL
  Filled 2019-08-10 (×5): qty 1

## 2019-08-10 MED ORDER — METHYLPREDNISOLONE SODIUM SUCC 40 MG IJ SOLR
40.0000 mg | Freq: Two times a day (BID) | INTRAMUSCULAR | Status: DC
  Administered 2019-08-10 – 2019-08-14 (×8): 40 mg via INTRAVENOUS
  Filled 2019-08-10 (×8): qty 1

## 2019-08-10 MED ORDER — VITAMIN C 500 MG PO TABS
500.0000 mg | ORAL_TABLET | Freq: Every day | ORAL | Status: DC
  Administered 2019-08-10 – 2019-08-14 (×5): 500 mg via ORAL
  Filled 2019-08-10 (×5): qty 1

## 2019-08-10 MED ORDER — ZINC SULFATE 220 (50 ZN) MG PO CAPS
220.0000 mg | ORAL_CAPSULE | Freq: Every day | ORAL | Status: DC
  Administered 2019-08-10 – 2019-08-14 (×5): 220 mg via ORAL
  Filled 2019-08-10 (×5): qty 1

## 2019-08-10 NOTE — ED Provider Notes (Signed)
[facility] Emergency Department Provider Note  ____________________________________________  Time seen: Approximately 2:01 AM  I have reviewed the triage vital signs and the nursing notes.   HISTORY  Chief Complaint Shortness of Breath   HPI Angel Floyd is a 68 y.o. female with a history of breast cancer on remission, diabetes, anemia, hypertension, hyperlipidemia who presents for evaluation of shortness of breath.  Patient was diagnosed COVID positive on 9/15. She was started on augmentin on 9/15 for possible overlying bacterial PNA.  Both her and her husband are positive.  She reports progressively worsening shortness of breath.  Over the last 24 hours her pulse oximeter at home has been measuring 82 to 86% on room air.  She does not have oxygen at home.  Today her shortness of breath was severe which prompted her to call EMS.  She was found to be hypoxic to 85% and did improved on 3 L nasal cannula.  She denies chest pain.  Her last fever was 48 hours ago.  No vomiting or diarrhea.  No history of smoking, COPD, asthma.  Patient reports that she feels markedly improved with oxygen.   She also complains of generalized malaise and weakness.  Past Medical History:  Diagnosis Date  . Anemia   . Anginal pain ([place])   . Breast cancer ([place])   . Diabetes mellitus ([place])   . History of abnormal Pap smear    class III, required cryosurgery  . Hypercholesterolemia   . Hypertension   . TIA (transient ischemic attack)     Patient Active Problem List   Diagnosis Date Noted  . Close Exposure to Covid-19 Virus 08/03/2019  . Persistent cough 11/17/2018  . Chest pain 10/13/2015  . Rash and nonspecific skin eruption 06/15/2015  . Health care maintenance 04/08/2015  . Carotid bruit 12/12/2014  . Left leg pain 07/12/2014  . Change in bowel function 07/12/2014  . Hypertension 10/10/2012  . Hypercholesterolemia 10/10/2012  . Type 2 diabetes mellitus with  neurological complications ([place]) 123XX123  . TIA (transient ischemic attack) 10/10/2012  . Anemia 10/10/2012  . History of breast cancer 10/10/2012    Past Surgical History:  Procedure Laterality Date  . BREAST CYST ASPIRATION  1989  . COLONOSCOPY WITH PROPOFOL N/A 11/05/2017   Procedure: COLONOSCOPY WITH PROPOFOL;  Surgeon: Lollie Sails, MD;  Location: [facility] ENDOSCOPY;  Service: Endoscopy;  Laterality: N/A;  . DILATION AND CURETTAGE OF UTERUS    . ESOPHAGOGASTRODUODENOSCOPY (EGD) WITH PROPOFOL N/A 11/05/2017   Procedure: ESOPHAGOGASTRODUODENOSCOPY (EGD) WITH PROPOFOL;  Surgeon: Lollie Sails, MD;  Location: [facility] ENDOSCOPY;  Service: Endoscopy;  Laterality: N/A;  . FOOT SURGERY     morton's neuroma removed - right foot  . FOOT SURGERY  10/2009   achilles tendon release with reconstructive surgery  . GYNECOLOGIC CRYOSURGERY     class III pap  . HAND SURGERY     cyst removed - left hand  . hysteroscopy and D&C  7/09  . KNEE ARTHROSCOPY Left [id number]   removed tear & resurfaced area  . KNEE SURGERY  09/20/12   torn meniscus, (Dr Leanor Kail)  . TUBAL LIGATION      Prior to Admission medications   Medication Sig Start Date End Date Taking? Authorizing Provider  aspirin 81 MG tablet Take 81 mg by mouth daily.    [provider]  clopidogrel (PLAVIX) 75 MG tablet TAKE 1 TABLET DAILY 03/06/19   Einar Pheasant, MD  Fe Fum-FePoly-Vit C-Vit B3 (  INTEGRA) 62.5-62.5-40-3 MG CAPS Take 1 capsule by mouth daily. 04/27/17   Einar Pheasant, MD  fish oil-omega-3 fatty acids 1000 MG capsule Take 2 g by mouth daily.     [provider]  fluticasone furoate-vilanterol (BREO ELLIPTA) 200-25 MCG/INH AEPB Inhale 1 puff into the lungs daily. 01/15/19   Wilhelmina Mcardle, MD  glucose blood test strip Use as instructed to check blood sugars three times daily. Has One Touch Ultra glucometer. Dx 250.00 01/23/18   Einar Pheasant, MD  metFORMIN (GLUCOPHAGE) 500 MG tablet TAKE 1  TABLET DAILY 05/16/19   Einar Pheasant, MD  pantoprazole (PROTONIX) 40 MG tablet Take 1 tablet (40 mg total) by mouth daily. 06/09/19   Einar Pheasant, MD  rosuvastatin (CRESTOR) 10 MG tablet TAKE 1 TABLET DAILY 05/16/19   Einar Pheasant, MD  telmisartan (MICARDIS) 20 MG tablet TAKE 1 TABLET DAILY 05/16/19   Einar Pheasant, MD    Allergies Aggrenox [aspirin-dipyridamole er]  Family History  Problem Relation Age of Onset  . Lung cancer Father   . Alcohol abuse Father   . Cancer Father        lung  . CVA Mother   . Colon cancer Mother   . Alcohol abuse Mother   . Cancer Mother        colon  . Stroke Mother   . Colon cancer Sister   . Diabetes Maternal Grandfather     Social History Social History   Tobacco Use  . Smoking status: Never Smoker  . Smokeless tobacco: Never Used  Substance Use Topics  . Alcohol use: No    Alcohol/week: 0.0 standard drinks  . Drug use: No    Review of Systems  Constitutional: Negative for fever. + Generalized malaise and weakness Eyes: Negative for visual changes. ENT: Negative for sore throat. Neck: No neck pain  Cardiovascular: Negative for chest pain. Respiratory: + shortness of breath. Gastrointestinal: Negative for abdominal pain, vomiting or diarrhea. Genitourinary: Negative for dysuria. Musculoskeletal: Negative for back pain. Skin: Negative for rash. Neurological: Negative for headaches, weakness or numbness. Psych: No SI or HI  ____________________________________________   PHYSICAL EXAM:  VITAL SIGNS: ED Triage Vitals  Enc Vitals Group     BP 08/10/19 0108 (!) 161/79     Pulse Rate 08/10/19 0108 61     Resp 08/10/19 0108 16     Temp 08/10/19 0108 98.5 F (36.9 C)     Temp Source 08/10/19 0108 Oral     SpO2 08/10/19 0105 (!) 85 %     Weight 08/10/19 0109 145 lb 1 oz (65.8 kg)     Height 08/10/19 0109 5\' 5"  (1.651 m)     Head Circumference --      Peak Flow --      Pain Score 08/10/19 0108 9     Pain Loc --       Pain Edu? --      Excl. in [place]? --     Constitutional: Alert and oriented, mild respiratory distress.  HEENT:      Head: Normocephalic and atraumatic.         Eyes: Conjunctivae are normal. Sclera is non-icteric.       Mouth/Throat: Mucous membranes are moist.       Neck: Supple with no signs of meningismus. Cardiovascular: Regular rate and rhythm. No murmurs, gallops, or rubs. 2+ symmetrical distal pulses are present in all extremities. No JVD. Respiratory: Mild increased work of breathing, hypoxic to 85% on room  air which improved on 3 L nasal cannula, faint crackles bilaterally Gastrointestinal: Soft, non tender, and non distended with positive bowel sounds. No rebound or guarding. Musculoskeletal: Nontender with normal range of motion in all extremities. No edema, cyanosis, or erythema of extremities. Neurologic: Normal speech and language. Face is symmetric. Moving all extremities. No gross focal neurologic deficits are appreciated. Skin: Skin is warm, dry and intact. No rash noted. Psychiatric: Mood and affect are normal. Speech and behavior are normal.  ____________________________________________   LABS (all labs ordered are listed, but only abnormal results are displayed)  Labs Reviewed  COMPREHENSIVE METABOLIC PANEL - Abnormal; Notable for the following components:      Result Value   Potassium 3.0 (*)    Glucose, Bld 117 (*)    Albumin 3.3 (*)    AST 68 (*)    ALT 124 (*)    All other components within normal limits  CBC WITH DIFFERENTIAL/PLATELET  PROCALCITONIN  TROPONIN I (HIGH SENSITIVITY)   ____________________________________________  EKG  ED ECG REPORT I, Rudene Re, the attending physician, personally viewed and interpreted this ECG.  Sinus rhythm, rate of 61, normal intervals, normal axis, minimal ST depressions in lateral leads with no ST elevation.  Unchanged from prior. ____________________________________________  RADIOLOGY  I have  personally reviewed the images performed during this visit and I agree with the Radiologist's read.   Interpretation by Radiologist:  Dg Chest Portable 1 View  Result Date: 08/10/2019 CLINICAL DATA:  Pneumonia EXAM: PORTABLE CHEST 1 VIEW COMPARISON:  08/06/2019 FINDINGS: There has been interval development of multifocal airspace opacities bilaterally. There is no pneumothorax. No large pleural effusion. The heart size is stable. Aortic calcifications are noted. There is no acute osseous abnormality. IMPRESSION: Interval development of multifocal airspace opacities bilaterally compatible with multifocal pneumonia (viral or bacterial). Electronically Signed   By: Constance Holster M.D.   On: 08/10/2019 01:36     ____________________________________________   PROCEDURES  Procedure(s) performed: None Procedures Critical Care performed: yes  CRITICAL CARE Performed by: Rudene Re  ?  Total critical care time: 30 min  Critical care time was exclusive of separately billable procedures and treating other patients.  Critical care was necessary to treat or prevent imminent or life-threatening deterioration.  Critical care was time spent personally by me on the following activities: development of treatment plan with patient and/or surrogate as well as nursing, discussions with consultants, evaluation of patient's response to treatment, examination of patient, obtaining history from patient or surrogate, ordering and performing treatments and interventions, ordering and review of laboratory studies, ordering and review of radiographic studies, pulse oximetry and re-evaluation of patient's condition.  ____________________________________________   INITIAL IMPRESSION / ASSESSMENT AND PLAN / ED COURSE  68 y.o. female with a history of breast cancer on remission, diabetes, anemia, hypertension, hyperlipidemia who presents for evaluation of shortness of breath and hypoxia in the setting  of COVID infection diagnosed 5 days ago.  Patient arrives with mildly increased work of breathing, hypoxic to 85% with crackles bilaterally, respiratory rate and oxygenation improved on 3 L nasal cannula.  No fever over the last 24 hours.  Normal white count. Labs show mildly elevated LFTs consistent with COVID disease and  Mild hypokalemia which was supplemented p.o. EKG and troponin with no signs of myocarditis.  Chest x-ray consistent with multifocal pneumonia.  Patient currently on Augmentin and since fever seems to have resolved since starting on Augmentin, will continue with abx coverage of rocephin and azithromycin. Procalcitonin level  pending.  Will consult [facility] for admission  Clinical Course as of Aug 09 721  Sun Aug 10, 2019  [id number] Patient reassessed, remains stable on [place] oxygen. Awaiting bed at [facility].   [CV]    Clinical Course User Index [CV] Alfred Levins [place], MD      As part of my medical decision making, I reviewed the following data within the [place] notes reviewed and incorporated, Labs reviewed , EKG interpreted , Old EKG reviewed, Old chart reviewed, Radiograph reviewed , Discussed with admitting physician , Notes from prior ED visits and [place] Controlled Substance Database   Patient was evaluated in Emergency Department today for the symptoms described in the history of present illness. Patient was evaluated in the context of the global COVID-19 pandemic, which necessitated consideration that the patient might be at risk for infection with the SARS-CoV-2 virus that causes COVID-19. Institutional protocols and algorithms that pertain to the evaluation of patients at risk for COVID-19 are in a state of rapid change based on information released by regulatory bodies including the CDC and federal and state organizations. These policies and algorithms were followed during the patient's care in the ED.    ____________________________________________   FINAL CLINICAL IMPRESSION(S) / ED DIAGNOSES   Final diagnoses:  Acute respiratory failure with hypoxia ([place])  Pneumonia due to COVID-19 virus      NEW MEDICATIONS STARTED DURING THIS VISIT:  ED Discharge Orders    None       Note:  This document was prepared using Dragon voice recognition software and may include unintentional dictation errors.    Rudene Re, MD 08/10/19 [phone]

## 2019-08-10 NOTE — ED Notes (Signed)
Waiting for bed from [facility], patient aware

## 2019-08-10 NOTE — Plan of Care (Signed)
[place] hospitalist note:  68 yo F with COVID, new O2 requirement.  COVID positive test is visible under care everywhere on 08/05/2019, lab results.  CXR shows multifocal PNA.  Accepted to Med-surg here at [facility].

## 2019-08-10 NOTE — ED Notes (Signed)
Pt D/C into the care of [facility] at this time. Pt transferred to [facility] at this time. Pt noted to be in stable condition at this time.

## 2019-08-10 NOTE — Plan of Care (Signed)
Will initiate plan of care.

## 2019-08-10 NOTE — ED Notes (Signed)
Report given to [facility] [place].

## 2019-08-10 NOTE — Plan of Care (Signed)
POC reviewed with patient and spouse.

## 2019-08-10 NOTE — ED Notes (Signed)
This RN to bedside to check on patient, pt requesting this RN to cut off her sports bra, this RN did as patient asked, pt states she is much more comfortable after her sports bra cut off. This RN obtained signature for consent for transfer. Updated patient. Pt states understanding. Will continue to monitor for further patient needs.

## 2019-08-10 NOTE — ED Notes (Signed)
emtala reviewed by this RN 

## 2019-08-10 NOTE — ED Notes (Signed)
Provided patient with ice water and a warm blanket. Patient is resting comfortably, this RN updated her regarding status of [facility] transfer

## 2019-08-10 NOTE — H&P (Signed)
[facility] History and Physical  Angel Floyd [id number] DOB: 02-Jan-1951 DOA: 08/10/2019   PCP: Einar Pheasant, MD  Specialists: None  Chief Complaint: Shortness of breath and cough for about a week or so  HPI: Angel Floyd is a 68 y.o. female with a past medical history of asthma, stroke, diabetes mellitus type 2, essential hypertension, remote history of breast cancer in remission, lives with her husband.  Her husband tested positive for COVID about 2 weeks ago.  She started developing symptoms.  She was originally tested for COVID on September 10 when it was negative.  Her symptoms persisted and she started developing shortness of breath and cough.  She went to her primary care provider office on September 15 and was retested and that time she was positive.  She was told us to observe herself at home.  She was prescribed Augmentin for possible bacterial pneumonia.  Over the last 24 hours she has been checking her oxygen saturations at home and they have been measuring 82 to 86%.  She started getting worsening shortness of breath.  So she decided to come back to the emergency department.  She was noted to have oxygen saturations of 85%.  She was placed on oxygen.  She mentioned coughing up a yellowish expectoration.  Denies orthopnea or PND.  Occasional chest tightness.  No headaches.  No nausea vomiting.  No diarrhea.  In the emergency department chest x-ray was done which showed bilateral pneumonia.  She was noted to be hypoxic as discussed above.  She was hospitalized for further management.  Originally seen at [facility].  Subsequently transferred to [facility].     Home Medications: Prior to Admission medications   Medication Sig Start Date End Date Taking? Authorizing Provider  albuterol (VENTOLIN HFA) 108 (90 Base) MCG/ACT inhaler Inhale 2 puffs into the lungs every 4 (four) hours as needed for wheezing. 08/05/19  Yes [provider]  amoxicillin-clavulanate  (AUGMENTIN) 875-125 MG tablet Take 1 tablet by mouth 2 (two) times daily. 08/05/19 08/12/19 Yes [provider]  aspirin 81 MG tablet Take 81 mg by mouth daily.   Yes [provider]  azithromycin (ZITHROMAX) 250 MG tablet Take 250 mg by mouth daily. 2 tablets by mouth day 1 then 1 tablet daily x 4 days 08/05/19 08/10/19 Yes [provider]  benzonatate (TESSALON) 200 MG capsule Take 200 mg by mouth 3 (three) times daily as needed for cough. 08/05/19 08/12/19 Yes [provider]  clopidogrel (PLAVIX) 75 MG tablet TAKE 1 TABLET DAILY Patient taking differently: Take 75 mg by mouth daily.  03/06/19  Yes Einar Pheasant, MD  Fe Fum-FePoly-Vit C-Vit B3 (INTEGRA) 62.5-62.5-40-3 MG CAPS Take 1 capsule by mouth daily. 04/27/17  Yes Einar Pheasant, MD  fish oil-omega-3 fatty acids 1000 MG capsule Take 2 g by mouth daily.    Yes [provider]  fluticasone furoate-vilanterol (BREO ELLIPTA) 200-25 MCG/INH AEPB Inhale 1 puff into the lungs daily. Patient taking differently: Inhale 1 puff into the lungs daily as needed.  01/15/19  Yes Wilhelmina Mcardle, MD  metFORMIN (GLUCOPHAGE) 500 MG tablet TAKE 1 TABLET DAILY Patient taking differently: Take 500 mg by mouth daily with breakfast.  05/16/19  Yes Einar Pheasant, MD  pantoprazole (PROTONIX) 40 MG tablet Take 1 tablet (40 mg total) by mouth daily. 06/09/19  Yes Einar Pheasant, MD  predniSONE (DELTASONE) 20 MG tablet Take 20 mg by mouth daily with breakfast. 08/05/19 08/10/19 Yes [provider]  rosuvastatin (CRESTOR) 10 MG tablet TAKE 1 TABLET DAILY 05/16/19  Yes Einar Pheasant, MD  telmisartan (MICARDIS) 20 MG tablet TAKE 1 TABLET DAILY Patient taking differently: Take 20 mg by mouth daily.  05/16/19  Yes Einar Pheasant, MD  glucose blood test strip Use as instructed to check blood sugars three times daily. Has One Touch Ultra glucometer. Dx 250.00 01/23/18   Einar Pheasant, MD    Allergies:  Allergies  Allergen  Reactions  . Aggrenox [Aspirin-Dipyridamole Er]     Photosensitivity, "felt awful"    Past Medical History: Past Medical History:  Diagnosis Date  . Anemia   . Anginal pain ([place])   . Breast cancer ([place])   . Diabetes mellitus ([place])   . History of abnormal Pap smear    class III, required cryosurgery  . Hypercholesterolemia   . Hypertension   . TIA (transient ischemic attack)     Past Surgical History:  Procedure Laterality Date  . BREAST CYST ASPIRATION  1989  . COLONOSCOPY WITH PROPOFOL N/A 11/05/2017   Procedure: COLONOSCOPY WITH PROPOFOL;  Surgeon: Lollie Sails, MD;  Location: [facility] ENDOSCOPY;  Service: Endoscopy;  Laterality: N/A;  . DILATION AND CURETTAGE OF UTERUS    . ESOPHAGOGASTRODUODENOSCOPY (EGD) WITH PROPOFOL N/A 11/05/2017   Procedure: ESOPHAGOGASTRODUODENOSCOPY (EGD) WITH PROPOFOL;  Surgeon: Lollie Sails, MD;  Location: [facility] ENDOSCOPY;  Service: Endoscopy;  Laterality: N/A;  . FOOT SURGERY     morton's neuroma removed - right foot  . FOOT SURGERY  10/2009   achilles tendon release with reconstructive surgery  . GYNECOLOGIC CRYOSURGERY     class III pap  . HAND SURGERY     cyst removed - left hand  . hysteroscopy and D&C  7/09  . KNEE ARTHROSCOPY Left [id number]   removed tear & resurfaced area  . KNEE SURGERY  09/20/12   torn meniscus, (Dr Leanor Kail)  . TUBAL LIGATION      Social History: Denies smoking alcohol use illicit drug use.  Lives with her husband.  Independent with daily activities.   Family History:  Family History  Problem Relation Age of Onset  . Lung cancer Father   . Alcohol abuse Father   . Cancer Father        lung  . CVA Mother   . Colon cancer Mother   . Alcohol abuse Mother   . Cancer Mother        colon  . Stroke Mother   . Colon cancer Sister   . Diabetes Maternal Grandfather      Review of Systems - History obtained from the patient General ROS: positive for  - chills, fatigue and malaise Psychological  ROS: negative Ophthalmic ROS: negative ENT ROS: negative Allergy and Immunology ROS: negative Hematological and Lymphatic ROS: negative Endocrine ROS: negative Respiratory ROS: As in HPI Cardiovascular ROS: As in HPI Gastrointestinal ROS: no abdominal pain, change in bowel habits, or black or bloody stools Genito-Urinary ROS: no dysuria, trouble voiding, or hematuria Musculoskeletal ROS: negative Neurological ROS: no TIA or stroke symptoms Dermatological ROS: negative  Physical Examination  Vitals:   08/10/19 1345 08/10/19 1436 08/10/19 1646  BP: (!) 158/68  (!) 132/59  Pulse: (!) 54    Resp: 20    Temp: (!) 97.5 F (36.4 C)  97.9 F (36.6 C)  TempSrc: Oral  Oral  SpO2: 99%    Weight:  65.8 kg   Height:  5\' 5"  (1.651 m)     BP (!) 132/59  Pulse (!) 54   Temp 97.9 F (36.6 C) (Oral)   Resp 20   Ht 5\' 5"  (1.651 m)   Wt 65.8 kg   SpO2 99%   BMI 24.14 kg/m   General appearance: alert, cooperative, appears stated age and no distress Head: Normocephalic, without obvious abnormality, atraumatic Eyes: conjunctivae/corneas clear. PERRL, EOM's intact.  Throat: lips, mucosa, and tongue normal; teeth and gums normal Neck: no adenopathy, no carotid bruit, no JVD, supple, symmetrical, trachea midline and thyroid not enlarged, symmetric, no tenderness/mass/nodules Resp: Mildly tachypneic at rest.  No use of accessory muscles.  Coarse breath sounds bilaterally with crackles bilaterally left more than right.  No wheezing or rhonchi. Cardio: regular rate and rhythm, S1, S2 normal, no murmur, click, rub or gallop GI: soft, non-tender; bowel sounds normal; no masses,  no organomegaly Extremities: extremities normal, atraumatic, no cyanosis or edema Pulses: 2+ and symmetric Skin: Skin color, texture, turgor normal. No rashes or lesions Lymph nodes: Cervical, supraclavicular, and axillary nodes normal. Neurologic: Alert and oriented x3.  No cranial nerve deficits.  Motor strength  equal bilateral upper and lower extremities.   Labs on Admission: I have personally reviewed following labs and imaging studies  CBC: Recent Labs  Lab 08/06/19 2059 08/10/19 0113  WBC 6.3 4.0  NEUTROABS 5.0 2.5  HGB 13.8 13.5  HCT 40.4 40.6  MCV 85.8 87.3  PLT 148* [id number]   Basic Metabolic Panel: Recent Labs  Lab 08/06/19 2059 08/10/19 0113  NA 139 139  K 3.3* 3.0*  CL 98 98  CO2 27 30  GLUCOSE 123* 117*  BUN 18 22  CREATININE 0.66 0.60  CALCIUM 9.2 8.9   GFR: Estimated Creatinine Clearance: 61.4 mL/min (by C-G formula based on SCr of 0.6 mg/dL). Liver Function Tests: Recent Labs  Lab 08/06/19 2059 08/10/19 0113  AST 28 68*  ALT 23 124*  ALKPHOS 61 100  BILITOT 0.9 1.0  PROT 7.6 7.2  ALBUMIN 4.0 3.3*   CBG: Recent Labs  Lab 08/10/19 1644  GLUCAP 100*     Radiological Exams on Admission: Dg Chest Portable 1 View  Result Date: 08/10/2019 CLINICAL DATA:  Pneumonia EXAM: PORTABLE CHEST 1 VIEW COMPARISON:  08/06/2019 FINDINGS: There has been interval development of multifocal airspace opacities bilaterally. There is no pneumothorax. No large pleural effusion. The heart size is stable. Aortic calcifications are noted. There is no acute osseous abnormality. IMPRESSION: Interval development of multifocal airspace opacities bilaterally compatible with multifocal pneumonia (viral or bacterial). Electronically Signed   By: Constance Holster M.D.   On: 08/10/2019 01:36    My interpretation of Electrocardiogram: Sinus rhythm at 55 bpm.  Normal axis.  RBBB noted.  No concerning ST or T wave changes.   Problem List  Principal Problem:   Pneumonia due to COVID-19 virus Active Problems:   Hypertension   Type 2 diabetes mellitus with neurological complications ([place])   History of breast cancer   Acute respiratory failure with hypoxia ([facility])   History of stroke   Transaminitis   Assessment: This is a 68 year old Caucasian female with a past medical history as  stated earlier who comes in with one-week history of worsening shortness of breath and cough.  She was noted to be hypoxic in the emergency department.  She was positive for COVID-19 on September 15.  Results are available in care everywhere.  She is noted to have transaminitis.  Plan:  1. Pneumonia secondary to COVID-19/Acute respiratory failure with hypoxia: Symptoms and presentation most  likely due to COVID-19.  Procalcitonin less than 0.1 which makes bacterial infection less likely.  WBC is normal.  Patient will be hospitalized.  Placed on oxygen.  Check inflammatory markers.  She will be placed on Remdesivir and steroids.  No need for antibacterials.  Actemra and convalescent plasma discussed with the patient in case they are needed in the future.  Incentive spirometry.  Prone positioning explained.  Mobilization as much as possible.  2.  Transaminitis: Most likely due to COVID-19.  Trend LFTs.  Check hepatitis panel.  3.  Diabetes mellitus type 2, controlled: We will check HbA1c.  Anticipate some worsening in glycemic control due to steroids.  SSI.  Hold her metformin.  4.  Essential hypertension: Monitor blood pressures closely.  Hold her antihypertensives for today.  5.  Remote history of breast cancer: This was in 2012.  Is not on any treatment currently.  6.  Hypokalemia: Potassium will be repleted.  7. History of asthma: Continue home medications.   DVT Prophylaxis: Lovenox Code Status: Full code Family Communication: Discussed with the patient Disposition: Hopefully home when improved Consults called: None Admission Status: Inpatient  Severity of Illness: The appropriate patient status for this patient is INPATIENT. Inpatient status is judged to be reasonable and necessary in order to provide the required intensity of service to ensure the patient's safety. The patient's presenting symptoms, physical exam findings, and initial radiographic and laboratory data in the context of  their chronic comorbidities is felt to place them at high risk for further clinical deterioration. Furthermore, it is not anticipated that the patient will be medically stable for discharge from the hospital within 2 midnights of admission. The following factors support the patient status of inpatient.   " The patient's presenting symptoms include shortness of breath and cough. " The worrisome physical exam findings include crackles in the lung, hypoxia. " The initial radiographic and laboratory data are worrisome because of bilateral lung opacities. " The chronic co-morbidities include diabetes and hypertension.   * I certify that at the point of admission it is my clinical judgment that the patient will require inpatient hospital care spanning beyond 2 midnights from the point of admission due to high intensity of service, high risk for further deterioration and high frequency of surveillance required.*    Further management decisions will depend on results of further testing and patient's response to treatment.   Natacha Jepsen [employer]  [facility] operational officer on [email or web address]  08/10/2019, 4:59 PM

## 2019-08-10 NOTE — Progress Notes (Addendum)
Report received from [place] during BSR, pt is sitting in bed and has eaten only about 25% of her dinner. She denies pain and SOB. VSS, call light within reach, she uses it appropriately

## 2019-08-10 NOTE — ED Notes (Signed)
Patient assisted to restroom, steady with assist. Repositioned in bed

## 2019-08-10 NOTE — ED Notes (Signed)
Patient has bed at [facility], [place] [id number]  Called [facility], truck not available until after 7AM

## 2019-08-10 NOTE — ED Notes (Signed)
[facility] called about transport per Ruby will be after 1100

## 2019-08-10 NOTE — ED Notes (Signed)
This RN to bedside at this time, pt states, "I'm just ready to leave and go get better, I need to go to [facility] and get better". Pt states that she needs to be transferred to [facility] and that she is tired of sitting around waiting and that "they have to have more than one truck". This RN apologized profusely for delay and explained to patient several times that [facility] would be coming to get patient. Pt states understanding, noted to be tearful intermittently, states after going to the bathroom feels like a "knot" in her chest to the L side that is relieved with pressure. Repeat EKG obtained by this RN. Pt remains SB on the monitor, 2L via [place]. PT appears appeased after speaking with this RN. Will continue to monitor for further patient needs.

## 2019-08-10 NOTE — ED Triage Notes (Signed)
Patient dx with COVID 9/15, presents with increased shortness of breath and decreased sats. EMS found patient on room air at 85%

## 2019-08-11 LAB — COMPREHENSIVE METABOLIC PANEL
ALT: 99 U/L — ABNORMAL HIGH (ref 0–44)
AST: 48 U/L — ABNORMAL HIGH (ref 15–41)
Albumin: 3.2 g/dL — ABNORMAL LOW (ref 3.5–5.0)
Alkaline Phosphatase: 85 U/L (ref 38–126)
Anion gap: 12 (ref 5–15)
BUN: 22 mg/dL (ref 8–23)
CO2: 28 mmol/L (ref 22–32)
Calcium: 9.3 mg/dL (ref 8.9–10.3)
Chloride: 100 mmol/L (ref 98–111)
Creatinine, Ser: 0.59 mg/dL (ref 0.44–1.00)
GFR calc Af Amer: >60 mL/min (ref >60)
GFR calc non Af Amer: >60 mL/min (ref >60)
Glucose, Bld: 159 mg/dL — ABNORMAL HIGH (ref 70–99)
Potassium: 4.7 mmol/L (ref 3.5–5.1)
Sodium: 140 mmol/L (ref 135–145)
Total Bilirubin: 0.7 mg/dL (ref 0.3–1.2)
Total Protein: 7.1 g/dL (ref 6.5–8.1)

## 2019-08-11 LAB — HEMOGLOBIN A1C
Hgb A1c MFr Bld: 6.4 % — ABNORMAL HIGH (ref 4.8–5.6)
Mean Plasma Glucose: 136.98 mg/dL

## 2019-08-11 LAB — CBC WITH DIFFERENTIAL/PLATELET
Abs Immature Granulocytes: 0.01 10*3/uL (ref 0.00–0.07)
Basophils Absolute: 0 10*3/uL (ref 0.0–0.1)
Basophils Relative: 0 %
Eosinophils Absolute: 0 10*3/uL (ref 0.0–0.5)
Eosinophils Relative: 0 %
HCT: 40.6 % (ref 36.0–46.0)
Hemoglobin: 13.1 g/dL (ref 12.0–15.0)
Immature Granulocytes: 0 %
Lymphocytes Relative: 21 %
Lymphs Abs: 0.8 10*3/uL (ref 0.7–4.0)
MCH: 29.2 pg (ref 26.0–34.0)
MCHC: 32.3 g/dL (ref 30.0–36.0)
MCV: 90.6 fL (ref 80.0–100.0)
Monocytes Absolute: 0.2 10*3/uL (ref 0.1–1.0)
Monocytes Relative: 5 %
Neutro Abs: 3 10*3/uL (ref 1.7–7.7)
Neutrophils Relative %: 74 %
Platelets: 278 10*3/uL (ref 150–400)
RBC: 4.48 MIL/uL (ref 3.87–5.11)
RDW: 11.4 % — ABNORMAL LOW (ref 11.5–15.5)
WBC: 4 10*3/uL (ref 4.0–10.5)
nRBC: 0 % (ref 0.0–0.2)

## 2019-08-11 LAB — CULTURE, BLOOD (ROUTINE X 2)
Culture: NO GROWTH
Culture: NO GROWTH
Special Requests: ADEQUATE
Special Requests: ADEQUATE

## 2019-08-11 LAB — C-REACTIVE PROTEIN: CRP: 5.1 mg/dL — ABNORMAL HIGH (ref <1.0)

## 2019-08-11 LAB — GLUCOSE, CAPILLARY
Glucose-Capillary: 161 mg/dL — ABNORMAL HIGH (ref 70–99)
Glucose-Capillary: 214 mg/dL — ABNORMAL HIGH (ref 70–99)
Glucose-Capillary: 178 mg/dL — ABNORMAL HIGH (ref 70–99)
Glucose-Capillary: 226 mg/dL — ABNORMAL HIGH (ref 70–99)

## 2019-08-11 LAB — MAGNESIUM: Magnesium: 2.1 mg/dL (ref 1.7–2.4)

## 2019-08-11 LAB — D-DIMER, QUANTITATIVE: D-Dimer, Quant: 0.53 ug/mL-FEU — ABNORMAL HIGH (ref 0.00–0.50)

## 2019-08-11 LAB — ABO/RH: ABO/RH(D): A POS

## 2019-08-11 LAB — HIV ANTIBODY (ROUTINE TESTING W REFLEX): HIV Screen 4th Generation wRfx: NONREACTIVE

## 2019-08-11 LAB — FERRITIN: Ferritin: 464 ng/mL — ABNORMAL HIGH (ref 11–307)

## 2019-08-11 MED ORDER — IRBESARTAN 75 MG PO TABS
75.0000 mg | ORAL_TABLET | Freq: Every day | ORAL | Status: DC
  Administered 2019-08-11 – 2019-08-14 (×4): 75 mg via ORAL
  Filled 2019-08-11 (×6): qty 1

## 2019-08-11 NOTE — Progress Notes (Signed)
Pt is resting in bed, she offers no c/o pain or SOB.  She is very relieved to report feeling "much better today". Pt is now ambulating to the bathroom independently. Education on pronation reviewed with her. Call light within reach

## 2019-08-11 NOTE — TOC Initial Note (Signed)
Transition of Care [facility]) - Initial/Assessment Note    Patient Details  Name: Angel Floyd MRN: [id number] Date of Birth: 10-Sep-1951  [facility] of [facility]) CM/SW Contact:    Carles Collet, RN Phone Number: 08/11/2019, 11:59 AM  Clinical Narrative:              Spoke with patient over the phone. SHe states she lives at home with her spouse whom she states is COVID- (was positive has been given "a clean bill of health" from the [facility]) , both drive and she describes herself as independent. Spouse will provide ride home at discharge. She verified her PCP is [facility] and [facility] [place], [place]. Encouraged her to utilize the drive through pharmacy after DC. She does not use any DME at home has [facility] etc available to her from caring for her mother.  Currently on supplemental oxygen will continue to follow for O2 needs at DC.      Expected Discharge Plan: Home/Self Care Barriers to Discharge: Continued Medical Work up   Patient Goals and CMS Choice        Expected Discharge Plan and Services Expected Discharge Plan: Home/Self Care       Living arrangements for the past 2 months: Single Family Home                                      Prior Living Arrangements/Services Living arrangements for the past 2 months: Single Family Home Lives with:: Spouse Patient language and need for interpreter reviewed:: Yes Do you feel safe going back to the place where you live?: Yes            Criminal Activity/Legal Involvement Pertinent to Current Situation/Hospitalization: No - Comment as needed  Activities of Daily Living Home Assistive Devices/Equipment: None ADL Screening (condition at time of admission) Patient's cognitive ability adequate to safely complete daily activities?: Yes Is the patient deaf or have difficulty hearing?: No Does the patient have difficulty seeing, even when wearing glasses/contacts?: No Does the patient have difficulty concentrating, remembering, or  making decisions?: No Patient able to express need for assistance with ADLs?: Yes Does the patient have difficulty dressing or bathing?: No Independently performs ADLs?: Yes (appropriate for developmental age) Does the patient have difficulty walking or climbing stairs?: No Weakness of Legs: None Weakness of Arms/Hands: None  Permission Sought/Granted                  Emotional Assessment              Admission diagnosis:  COVID 19 Patient Active Problem List   Diagnosis Date Noted  . Pneumonia due to COVID-19 virus 08/10/2019  . Acute respiratory failure with hypoxia ([place]) 08/10/2019  . History of stroke 08/10/2019  . Transaminitis 08/10/2019  . Close Exposure to Covid-19 Virus 08/03/2019  . Persistent cough 11/17/2018  . Chest pain 10/13/2015  . Rash and nonspecific skin eruption 06/15/2015  . Health care maintenance 04/08/2015  . Carotid bruit 12/12/2014  . Left leg pain 07/12/2014  . Change in bowel function 07/12/2014  . Hypertension 10/10/2012  . Hypercholesterolemia 10/10/2012  . Type 2 diabetes mellitus with neurological complications ([place]) [id number]  . TIA (transient ischemic attack) 10/10/2012  . Anemia 10/10/2012  . History of breast cancer 10/10/2012   PCP:  Einar Pheasant, MD Pharmacy:   [place],  [place] - [address] [place] [address] Phone: [phone] Fax: [phone]  [id number] [address], [address] [place] [address] Phone: [phone] Fax: [phone]  [facility] [place] [place] [address] [place] [address] Phone: [phone] Fax: [phone]     Social Determinants of Health (SDOH) Interventions    Readmission Risk Interventions No flowsheet data found.

## 2019-08-11 NOTE — Progress Notes (Addendum)
1451: Call to husband Simona Huh. Updated on patient condition. All questions answered.  1633: Patient walked in hallway. Desat to 87% while halfway through 5 min walk.. Dropped briefly to 86% toward end of walk. 3 min to recover.

## 2019-08-11 NOTE — Progress Notes (Addendum)
PROGRESS NOTE  Angel Floyd [id number] DOB: Oct 05, 1951 DOA: 08/10/2019  PCP: Einar Pheasant, MD  Brief History/Interval Summary: 68 y.o. female with a past medical history of asthma, stroke, diabetes mellitus type 2, essential hypertension, remote history of breast cancer in remission, lives with her husband.  Her husband tested positive for COVID about 2 weeks ago.  She was originally tested for COVID on September 10 when it was negative.  Her symptoms persisted and she started developing shortness of breath and cough.  She went to her primary care provider office on September 15 and was retested and that time she was positive.  She was told us to observe herself at home.  She was prescribed Augmentin for possible bacterial pneumonia.  Over the last 24 hours she has been checking her oxygen saturations at home and they have been measuring 82 to 86%.  In the emergency department chest x-ray was done which showed bilateral pneumonia.  She was noted to be hypoxic as discussed above.  She was hospitalized for further management.  Originally seen at [facility].  Subsequently transferred to [facility].     Reason for Visit: Pneumonia due to COVID-19  Consultants: None  Procedures: None  Antibiotics: Anti-infectives (From admission, onward)   Start     Dose/Rate Route Frequency Ordered Stop   08/11/19 1900  remdesivir 100 mg in sodium chloride 0.9 % 250 mL IVPB     100 mg 500 mL/hr over 30 Minutes Intravenous Every 24 hours 08/10/19 1744 08/15/19 1859   08/10/19 1900  remdesivir 200 mg in sodium chloride 0.9 % 250 mL IVPB     200 mg 500 mL/hr over 30 Minutes Intravenous Once 08/10/19 1744 08/10/19 2248       Subjective/Interval History: Patient states that she is still short of breath although no worse compared to yesterday.  Continues to have a cough which is dry for the most part.  Occasionally with whitish expectoration.  Denies any chest pain.  No nausea or vomiting.     Assessment/Plan:  Acute Hypoxic Resp. Failure due to Acute Covid 19 Viral Illness/pneumonia due to COVID-19  COVID-19 Labs  Recent Labs    08/11/19 0230  DDIMER 0.53*  FERRITIN 464*  CRP 5.1*    Lab Results  Component Value Date   SARSCOV2NAA Not Detected 07/31/2019     Fever: Remains afebrile Oxygen requirements: 2 L via nasal cannula.  Saturating in the early 90s. Antibacterials: None Remdesivir: Day 2 today Steroids: Solu-Medrol 40 mg every 12 hours Diuretics: None on a regular basis Actemra: Not given yet Vitamin C and Zinc: Continue DVT Prophylaxis:  Lovenox 40 mg subcu daily  Patient's respiratory status is stable.  She is requiring 2 L of oxygen and saturating in the early 90s.  Symptomatically she is feeling slightly better.  Continue Remdesivir and steroids.  Prone positioning as much as tolerated.  Incentive spirometry and mobilization.  CRP noted to be 5.1.  Ferritin 464.  D-dimer 0.53.  Continue to trend inflammatory markers.  Procalcitonin was less than 0.1.  No antibacterials at this time.  Since patient is stable we will hold off on offering Actemra and convalescent plasma for now.  Transaminitis Abnormal LFTs most likely due to COVID-19.  Improved this morning.  Diabetes mellitus type 2, uncontrolled with hyperglycemia Elevated CBGs most likely due to steroids.  Holding her metformin.  Continue SSI.  May need to use long-acting insulin.  HbA1c 6.4.  Essential hypertension Occasional high readings of  blood pressure noted.  We will resume her home medication regimen.  Continue to monitor.  Remote history of breast cancer This was in 2012.  Not on any treatment currently.  Hypokalemia Corrected.  Magnesium is 2.1.  History of asthma Stable.  No wheezing.  Continue home medications.  History of stroke/TIA Stable.  Continue antiplatelet agents.   DVT Prophylaxis: Lovenox PUD Prophylaxis: Pepcid Code Status: Full code Family Communication: We will  update her husband.  Discussed with the patient Disposition Plan: Hopefully home when improved.  Mobilize as much as tolerated.   Medications:  Scheduled: . aspirin EC  81 mg Oral Daily  . clopidogrel  75 mg Oral Daily  . docusate sodium  100 mg Oral BID  . enoxaparin (LOVENOX) injection  40 mg Subcutaneous Q24H  . feeding supplement (ENSURE ENLIVE)  237 mL Oral BID BM  . fluticasone furoate-vilanterol  1 puff Inhalation Daily  . insulin aspart  0-20 Units Subcutaneous TID WC  . insulin aspart  0-5 Units Subcutaneous QHS  . methylPREDNISolone (SOLU-MEDROL) injection  40 mg Intravenous Q12H  . pantoprazole  40 mg Oral Daily  . rosuvastatin  10 mg Oral Daily  . sodium chloride flush  3 mL Intravenous Q12H  . vitamin C  500 mg Oral Daily  . zinc sulfate  220 mg Oral Daily   Continuous: . sodium chloride    . remdesivir 100 mg in NS 250 mL     [id number] chloride, acetaminophen, albuterol, benzonatate, guaiFENesin-dextromethorphan, ondansetron **OR** ondansetron (ZOFRAN) IV, polyethylene glycol, sodium chloride flush   Objective:  Vital Signs  Vitals:   08/10/19 1646 08/10/19 1951 08/11/19 0321 08/11/19 0700  BP: (!) 132/59 (!) 146/66 (!) 155/64 132/71  Pulse:   (!) 53 65  Resp:  (!) 22 17 (!) 21  Temp: 97.9 F (36.6 C) 98.1 F (36.7 C) 98.6 F (37 C) (!) 97.5 F (36.4 C)  TempSrc: Oral Oral Oral Oral  SpO2:   96% 99%  Weight:      Height:        Intake/Output Summary (Last 24 hours) at 08/11/2019 1102 Last data filed at 08/11/2019 0844 Gross per 24 hour  Intake 853 ml  Output 300 ml  Net 553 ml   Filed Weights   08/10/19 1436  Weight: 65.8 kg    General appearance: Awake alert.  In no distress Resp: Tachypneic at rest.  No use of accessory muscles.  Crackles bilaterally.  No wheezing or rhonchi. Cardio: S1-S2 is normal regular.  No S3-S4.  Systolic murmur appreciated over the aortic area. GI: Abdomen is soft.  Nontender nondistended.  Bowel sounds are present  normal.  No masses organomegaly Extremities: No edema.  Full range of motion of lower extremities. Neurologic: Alert and oriented x3.  No focal neurological deficits.    Lab Results:  Data Reviewed: I have personally reviewed following labs and imaging studies  CBC: Recent Labs  Lab 08/06/19 2059 08/10/19 0113 08/11/19 0230  WBC 6.3 4.0 4.0  NEUTROABS 5.0 2.5 3.0  HGB 13.8 13.5 13.1  HCT 40.4 40.6 40.6  MCV 85.8 87.3 90.6  PLT 148* 244 0000000    Basic Metabolic Panel: Recent Labs  Lab 08/06/19 2059 08/10/19 0113 08/11/19 0230  NA 139 139 140  K 3.3* 3.0* 4.7  CL 98 98 100  CO2 27 30 28   GLUCOSE 123* 117* 159*  BUN 18 22 22   CREATININE 0.66 0.60 0.59  CALCIUM 9.2 8.9 9.3  MG  --   --  2.1    GFR: Estimated Creatinine Clearance: 61.4 mL/min (by C-G formula based on SCr of 0.59 mg/dL).  Liver Function Tests: Recent Labs  Lab 08/06/19 2059 08/10/19 0113 08/11/19 0230  AST 28 68* 48*  ALT 23 124* 99*  ALKPHOS 61 100 85  BILITOT 0.9 1.0 0.7  PROT 7.6 7.2 7.1  ALBUMIN 4.0 3.3* 3.2*     HbA1C: Recent Labs    08/10/19 0113  HGBA1C 6.4*    CBG: Recent Labs  Lab 08/10/19 1644 08/10/19 2153 08/11/19 0817  GLUCAP 100* 224* 161*     Anemia Panel: Recent Labs    08/11/19 0230  FERRITIN 464*    Recent Results (from the past 240 hour(s))  Culture, blood (routine x 2)     Status: None   Collection Time: 08/06/19  8:59 PM   Specimen: BLOOD  Result Value Ref Range Status   Specimen Description BLOOD RIGHT ANTECUBITAL  Final   Special Requests   Final    BOTTLES DRAWN AEROBIC AND ANAEROBIC Blood Culture adequate volume   Culture   Final    NO GROWTH 5 DAYS Performed at [facility], [address] [place], [place] [address]    Report Status 08/11/2019 FINAL  Final  Culture, blood (routine x 2)     Status: None   Collection Time: 08/06/19  9:04 PM   Specimen: BLOOD  Result Value Ref Range Status   Specimen Description BLOOD BLOOD  LEFT FOREARM  Final   Special Requests   Final    BOTTLES DRAWN AEROBIC AND ANAEROBIC Blood Culture adequate volume   Culture   Final    NO GROWTH 5 DAYS Performed at [facility], [address] [place],  [address]    Report Status 08/11/2019 FINAL  Final      Radiology Studies: Dg Chest Portable 1 View  Result Date: 08/10/2019 CLINICAL DATA:  Pneumonia EXAM: PORTABLE CHEST 1 VIEW COMPARISON:  08/06/2019 FINDINGS: There has been interval development of multifocal airspace opacities bilaterally. There is no pneumothorax. No large pleural effusion. The heart size is stable. Aortic calcifications are noted. There is no acute osseous abnormality. IMPRESSION: Interval development of multifocal airspace opacities bilaterally compatible with multifocal pneumonia (viral or bacterial). Electronically Signed   By: Constance Holster M.D.   On: 08/10/2019 01:36       LOS: 1 day   [facility] Pager on [email or web address]  08/11/2019, 11:02 AM

## 2019-08-11 NOTE — Progress Notes (Signed)
Initial Nutrition Assessment  DOCUMENTATION CODES:   Not applicable  INTERVENTION:   Ensure Enlive po BID, each supplement provides 350 kcal and 20 grams of protein  Pt receiving Hormel Shake daily with Breakfast which provides 520 kcals and 22 g of protein and Magic cup BID with lunch and dinner, each supplement provides 290 kcal and 9 grams of protein, automatically on meal trays to optimize nutritional intake.   Encourage PO intake at meals   NUTRITION DIAGNOSIS:   Increased nutrient needs related to (COVID-19 PNA) as evidenced by estimated needs.  GOAL:   Patient will meet greater than or equal to 90% of their needs  MONITOR:   PO intake, Supplement acceptance  REASON FOR ASSESSMENT:   Malnutrition Screening Tool    ASSESSMENT:   Pt with PMH of asthma, stroke, DM, HTN, and remote breast ca in remission admitted from home with COVID-19 PNA.   O2: 2L [place] Meal Completion: 25-50%   Medications reviewed and include: solumedrol, SSI, vitamin C, zinc, remdesivir  Labs reviewed: CBG's: [phone]    NUTRITION - FOCUSED PHYSICAL EXAM:  Deferred; RD working remotely   Diet Order:   Diet Order            Diet Carb Modified Fluid consistency: Thin; Room service appropriate? Yes  Diet effective now              EDUCATION NEEDS:   No education needs have been identified at this time  Skin:  Skin Assessment: Reviewed RN Assessment  Last BM:  9/18  Height:   Ht Readings from Last 1 Encounters:  08/10/19 5\' 5"  (1.651 m)    Weight:   Wt Readings from Last 1 Encounters:  08/10/19 65.8 kg    Ideal Body Weight:  56.8 kg  BMI:  Body mass index is 24.14 kg/m.  Estimated Nutritional Needs:   Kcal:  1700-2000  Protein:  80-100 grams  Fluid:  > 1.7 L/day  Maylon Peppers RD, LDN, CNSC [phone] Pager [phone] After Hours Pager

## 2019-08-11 NOTE — Plan of Care (Signed)
  Problem: Nutrition: Goal: Adequate nutrition will be maintained Outcome: Not Progressing  Poor appetite. PO encouraged. Nutritional supplements provided.

## 2019-08-11 NOTE — Progress Notes (Signed)
Pt has rested quietly t/o the night with no c/o pain, she gets very winded when transferring to [facility]. IV patent, VSS, call light within reach and she uses appropriately

## 2019-08-12 LAB — CBC WITH DIFFERENTIAL/PLATELET
Abs Immature Granulocytes: 0.03 10*3/uL (ref 0.00–0.07)
Basophils Absolute: 0 10*3/uL (ref 0.0–0.1)
Basophils Relative: 0 %
Eosinophils Absolute: 0 10*3/uL (ref 0.0–0.5)
Eosinophils Relative: 0 %
HCT: 38.7 % (ref 36.0–46.0)
Hemoglobin: 12.8 g/dL (ref 12.0–15.0)
Immature Granulocytes: 1 %
Lymphocytes Relative: 16 %
Lymphs Abs: 1 10*3/uL (ref 0.7–4.0)
MCH: 29.2 pg (ref 26.0–34.0)
MCHC: 33.1 g/dL (ref 30.0–36.0)
MCV: 88.4 fL (ref 80.0–100.0)
Monocytes Absolute: 0.5 10*3/uL (ref 0.1–1.0)
Monocytes Relative: 9 %
Neutro Abs: 4.6 10*3/uL (ref 1.7–7.7)
Neutrophils Relative %: 74 %
Platelets: 285 10*3/uL (ref 150–400)
RBC: 4.38 MIL/uL (ref 3.87–5.11)
RDW: 11.2 % — ABNORMAL LOW (ref 11.5–15.5)
WBC: 6.2 10*3/uL (ref 4.0–10.5)
nRBC: 0 % (ref 0.0–0.2)

## 2019-08-12 LAB — HEPATITIS PANEL, ACUTE
HCV Ab: <0.1 s/co ratio (ref 0.0–0.9)
Hep A IgM: NEGATIVE
Hep B C IgM: NEGATIVE
Hepatitis B Surface Ag: NEGATIVE

## 2019-08-12 LAB — COMPREHENSIVE METABOLIC PANEL
ALT: 83 U/L — ABNORMAL HIGH (ref 0–44)
AST: 32 U/L (ref 15–41)
Albumin: 3.1 g/dL — ABNORMAL LOW (ref 3.5–5.0)
Alkaline Phosphatase: 74 U/L (ref 38–126)
Anion gap: 12 (ref 5–15)
BUN: 24 mg/dL — ABNORMAL HIGH (ref 8–23)
CO2: 27 mmol/L (ref 22–32)
Calcium: 9 mg/dL (ref 8.9–10.3)
Chloride: 99 mmol/L (ref 98–111)
Creatinine, Ser: 0.55 mg/dL (ref 0.44–1.00)
GFR calc Af Amer: >60 mL/min (ref >60)
GFR calc non Af Amer: >60 mL/min (ref >60)
Glucose, Bld: 165 mg/dL — ABNORMAL HIGH (ref 70–99)
Potassium: 4.4 mmol/L (ref 3.5–5.1)
Sodium: 138 mmol/L (ref 135–145)
Total Bilirubin: 0.5 mg/dL (ref 0.3–1.2)
Total Protein: 6.8 g/dL (ref 6.5–8.1)

## 2019-08-12 LAB — GLUCOSE, CAPILLARY
Glucose-Capillary: 185 mg/dL — ABNORMAL HIGH (ref 70–99)
Glucose-Capillary: 235 mg/dL — ABNORMAL HIGH (ref 70–99)
Glucose-Capillary: 169 mg/dL — ABNORMAL HIGH (ref 70–99)
Glucose-Capillary: 273 mg/dL — ABNORMAL HIGH (ref 70–99)

## 2019-08-12 LAB — FERRITIN: Ferritin: 492 ng/mL — ABNORMAL HIGH (ref 11–307)

## 2019-08-12 LAB — MAGNESIUM: Magnesium: 2.1 mg/dL (ref 1.7–2.4)

## 2019-08-12 LAB — C-REACTIVE PROTEIN: CRP: 2.8 mg/dL — ABNORMAL HIGH (ref <1.0)

## 2019-08-12 LAB — D-DIMER, QUANTITATIVE: D-Dimer, Quant: 0.59 ug/mL-FEU — ABNORMAL HIGH (ref 0.00–0.50)

## 2019-08-12 NOTE — Plan of Care (Signed)
POC reviewed

## 2019-08-12 NOTE — Progress Notes (Addendum)
PROGRESS NOTE  Angel Floyd [id number] DOB: 1951-01-09 DOA: 08/10/2019  PCP: Einar Pheasant, MD  Brief History/Interval Summary: 68 y.o. female with a past medical history of asthma, stroke, diabetes mellitus type 2, essential hypertension, remote history of breast cancer in remission, lives with her husband.  Her husband tested positive for COVID about 2 weeks ago.  She was originally tested for COVID on September 10 when it was negative.  Her symptoms persisted and she started developing shortness of breath and cough.  She went to her primary care provider office on September 15 and was retested and that time she was positive.  She was told us to observe herself at home.  She was prescribed Augmentin for possible bacterial pneumonia.  Over the last 24 hours she has been checking her oxygen saturations at home and they have been measuring 82 to 86%.  In the emergency department chest x-ray was done which showed bilateral pneumonia.  She was noted to be hypoxic as discussed above.  She was hospitalized for further management.  Originally seen at [facility].  Subsequently transferred to [facility].     Reason for Visit: Pneumonia due to COVID-19  Consultants: None  Procedures: None  Antibiotics: Anti-infectives (From admission, onward)   Start     Dose/Rate Route Frequency Ordered Stop   08/11/19 1600  remdesivir 100 mg in sodium chloride 0.9 % 250 mL IVPB     100 mg 500 mL/hr over 30 Minutes Intravenous Every 24 hours 08/10/19 1744 08/15/19 1559   08/10/19 1900  remdesivir 200 mg in sodium chloride 0.9 % 250 mL IVPB     200 mg 500 mL/hr over 30 Minutes Intravenous Once 08/10/19 1744 08/10/19 2248       Subjective/Interval History: Patient states that she feels she is improving.  She is not as short of breath as she was when she came into the hospital.  She has been able to ambulate.  Continues to have a dry cough.  Taken off of oxygen yesterday evening.       Assessment/Plan:  Acute Hypoxic Resp. Failure due to Acute Covid 19 Viral Illness/pneumonia due to COVID-19  COVID-19 Labs  Recent Labs    08/11/19 0230 08/12/19 0155  DDIMER 0.53* 0.59*  FERRITIN 464* 492*  CRP 5.1* 2.8*    Lab Results  Component Value Date   SARSCOV2NAA Not Detected 07/31/2019     Fever: Remains afebrile Oxygen requirements: Now on room air.  Saturating in the early 90s. Antibacterials: None Remdesivir: Day 3 today Steroids: Solu-Medrol 40 mg every 12 hours Diuretics: None on a regular basis Actemra: Not given yet Vitamin C and Zinc: Continue DVT Prophylaxis:  Lovenox 40 mg subcu daily  Patient's respiratory status appears to be improving.  She has been weaned off of oxygen.  Continue Remdesivir and steroids.  Continue prone positioning, incentive spirometry and mobilization.  Inflammatory markers are trending down.  CRP is improved to 2.8.  D-dimer 0.59.  Procalcitonin was less than 0.1 and so the patient was not started on any antibacterials. Since patient is stable we will hold off on offering Actemra and convalescent plasma for now.  Transaminitis Abnormal LFTs most likely due to COVID-19.  Continue to improve.  Diabetes mellitus type 2, uncontrolled with hyperglycemia Elevated CBGs most likely due to steroids.  Holding her metformin.  HbA1c 6.4.  Continue just SSI for now.  Should improve as steroid is tapered down.  Essential hypertension Occasional high readings noted.  But  reasonably well controlled for the most part.  Continue her home medication regimen.    Remote history of breast cancer This was in 2012.  Not on any treatment currently.  Hypokalemia Corrected.  Magnesium is 2.1.  History of asthma Stable.  No wheezing.  Continue home medications.  History of stroke/TIA Stable.  Continue antiplatelet agents.   DVT Prophylaxis: Lovenox PUD Prophylaxis: Pepcid Code Status: Full code Family Communication: Discussed with the  patient.  Husband was updated yesterday.  Will call again today. Disposition Plan: Should be able to return home when improved.   Medications:  Scheduled: . aspirin EC  81 mg Oral Daily  . clopidogrel  75 mg Oral Daily  . docusate sodium  100 mg Oral BID  . enoxaparin (LOVENOX) injection  40 mg Subcutaneous Q24H  . feeding supplement (ENSURE ENLIVE)  237 mL Oral BID BM  . fluticasone furoate-vilanterol  1 puff Inhalation Daily  . insulin aspart  0-20 Units Subcutaneous TID WC  . insulin aspart  0-5 Units Subcutaneous QHS  . irbesartan  75 mg Oral Daily  . methylPREDNISolone (SOLU-MEDROL) injection  40 mg Intravenous Q12H  . pantoprazole  40 mg Oral Daily  . rosuvastatin  10 mg Oral Daily  . sodium chloride flush  3 mL Intravenous Q12H  . vitamin C  500 mg Oral Daily  . zinc sulfate  220 mg Oral Daily   Continuous: . sodium chloride    . remdesivir 100 mg in NS 250 mL Stopped (08/11/19 1634)   [id number] chloride, acetaminophen, albuterol, benzonatate, guaiFENesin-dextromethorphan, ondansetron **OR** ondansetron (ZOFRAN) IV, polyethylene glycol, sodium chloride flush   Objective:  Vital Signs  Vitals:   08/11/19 1540 08/11/19 1612 08/11/19 2022 08/12/19 0426  BP:  130/73 125/74 (!) 160/79  Pulse:  64 63 (!) 59  Resp:  (!) 22 (!) 34 17  Temp:  97.8 F (36.6 C) 98.4 F (36.9 C) 98.1 F (36.7 C)  TempSrc:   Oral Oral  SpO2: 95% 93% 91% 94%  Weight:      Height:        Intake/Output Summary (Last 24 hours) at 08/12/2019 0941 Last data filed at 08/11/2019 1744 Gross per 24 hour  Intake 307 ml  Output -  Net 307 ml   Filed Weights   08/10/19 1436  Weight: 65.8 kg    General appearance: Awake alert.  In no distress Resp: Improved effort.  Normal this morning.  Coarse breath sounds with crackles at the bases.  Improved air entry.  No wheezing or rhonchi.   Cardio: S1-S2 is normal regular.  No S3-S4.  No rubs murmurs or bruit GI: Abdomen is soft.  Nontender  nondistended.  Bowel sounds are present normal.  No masses organomegaly Extremities: No edema.  Full range of motion of lower extremities. Neurologic: Alert and oriented x3.  No focal neurological deficits.     Lab Results:  Data Reviewed: I have personally reviewed following labs and imaging studies  CBC: Recent Labs  Lab 08/06/19 2059 08/10/19 0113 08/11/19 0230 08/12/19 0155  WBC 6.3 4.0 4.0 6.2  NEUTROABS 5.0 2.5 3.0 4.6  HGB 13.8 13.5 13.1 12.8  HCT 40.4 40.6 40.6 38.7  MCV 85.8 87.3 90.6 88.4  PLT 148* 244 278 [id number]    Basic Metabolic Panel: Recent Labs  Lab 08/06/19 2059 08/10/19 0113 08/11/19 0230 08/12/19 0155  NA 139 139 140 138  K 3.3* 3.0* 4.7 4.4  CL 98 98 100 99  CO2 27 30  28 27  GLUCOSE 123* 117* 159* 165*  BUN 18 22 22  24*  CREATININE 0.66 0.60 0.59 0.55  CALCIUM 9.2 8.9 9.3 9.0  MG  --   --  2.1 2.1    GFR: Estimated Creatinine Clearance: 61.4 mL/min (by C-G formula based on SCr of 0.55 mg/dL).  Liver Function Tests: Recent Labs  Lab 08/06/19 2059 08/10/19 0113 08/11/19 0230 08/12/19 0155  AST 28 68* 48* 32  ALT 23 124* 99* 83*  ALKPHOS 61 100 85 74  BILITOT 0.9 1.0 0.7 0.5  PROT 7.6 7.2 7.1 6.8  ALBUMIN 4.0 3.3* 3.2* 3.1*     HbA1C: Recent Labs    08/10/19 0113  HGBA1C 6.4*    CBG: Recent Labs  Lab 08/11/19 0817 08/11/19 1127 08/11/19 1612 08/11/19 2142 08/12/19 0856  GLUCAP 161* 214* 178* 226* 185*     Anemia Panel: Recent Labs    08/11/19 0230 08/12/19 0155  FERRITIN 464* 492*    Recent Results (from the past 240 hour(s))  Culture, blood (routine x 2)     Status: None   Collection Time: 08/06/19  8:59 PM   Specimen: BLOOD  Result Value Ref Range Status   Specimen Description BLOOD RIGHT ANTECUBITAL  Final   Special Requests   Final    BOTTLES DRAWN AEROBIC AND ANAEROBIC Blood Culture adequate volume   Culture   Final    NO GROWTH 5 DAYS Performed at [facility], [address],  [place], [place] [address]    Report Status 08/11/2019 FINAL  Final  Culture, blood (routine x 2)     Status: None   Collection Time: 08/06/19  9:04 PM   Specimen: BLOOD  Result Value Ref Range Status   Specimen Description BLOOD BLOOD LEFT FOREARM  Final   Special Requests   Final    BOTTLES DRAWN AEROBIC AND ANAEROBIC Blood Culture adequate volume   Culture   Final    NO GROWTH 5 DAYS Performed at [facility], [address] [place], [place] [address]    Report Status 08/11/2019 FINAL  Final      Radiology Studies: No results found.     LOS: 2 days   [facility] Pager on [email or web address]  08/12/2019, 9:41 AM

## 2019-08-13 LAB — CBC WITH DIFFERENTIAL/PLATELET
Abs Immature Granulocytes: 0.09 10*3/uL — ABNORMAL HIGH (ref 0.00–0.07)
Basophils Absolute: 0 10*3/uL (ref 0.0–0.1)
Basophils Relative: 0 %
Eosinophils Absolute: 0 10*3/uL (ref 0.0–0.5)
Eosinophils Relative: 0 %
HCT: 36.7 % (ref 36.0–46.0)
Hemoglobin: 12.1 g/dL (ref 12.0–15.0)
Immature Granulocytes: 1 %
Lymphocytes Relative: 15 %
Lymphs Abs: 1.1 10*3/uL (ref 0.7–4.0)
MCH: 29.2 pg (ref 26.0–34.0)
MCHC: 33 g/dL (ref 30.0–36.0)
MCV: 88.6 fL (ref 80.0–100.0)
Monocytes Absolute: 0.6 10*3/uL (ref 0.1–1.0)
Monocytes Relative: 8 %
Neutro Abs: 5.7 10*3/uL (ref 1.7–7.7)
Neutrophils Relative %: 76 %
Platelets: 338 10*3/uL (ref 150–400)
RBC: 4.14 MIL/uL (ref 3.87–5.11)
RDW: 11.2 % — ABNORMAL LOW (ref 11.5–15.5)
WBC: 7.4 10*3/uL (ref 4.0–10.5)
nRBC: 0 % (ref 0.0–0.2)

## 2019-08-13 LAB — FERRITIN: Ferritin: 450 ng/mL — ABNORMAL HIGH (ref 11–307)

## 2019-08-13 LAB — COMPREHENSIVE METABOLIC PANEL
ALT: 71 U/L — ABNORMAL HIGH (ref 0–44)
AST: 27 U/L (ref 15–41)
Albumin: 3 g/dL — ABNORMAL LOW (ref 3.5–5.0)
Alkaline Phosphatase: 73 U/L (ref 38–126)
Anion gap: 12 (ref 5–15)
BUN: 25 mg/dL — ABNORMAL HIGH (ref 8–23)
CO2: 26 mmol/L (ref 22–32)
Calcium: 8.8 mg/dL — ABNORMAL LOW (ref 8.9–10.3)
Chloride: 100 mmol/L (ref 98–111)
Creatinine, Ser: 0.61 mg/dL (ref 0.44–1.00)
GFR calc Af Amer: >60 mL/min (ref >60)
GFR calc non Af Amer: >60 mL/min (ref >60)
Glucose, Bld: 163 mg/dL — ABNORMAL HIGH (ref 70–99)
Potassium: 4.5 mmol/L (ref 3.5–5.1)
Sodium: 138 mmol/L (ref 135–145)
Total Bilirubin: 0.5 mg/dL (ref 0.3–1.2)
Total Protein: 6.5 g/dL (ref 6.5–8.1)

## 2019-08-13 LAB — GLUCOSE, CAPILLARY
Glucose-Capillary: 179 mg/dL — ABNORMAL HIGH (ref 70–99)
Glucose-Capillary: 257 mg/dL — ABNORMAL HIGH (ref 70–99)
Glucose-Capillary: 236 mg/dL — ABNORMAL HIGH (ref 70–99)
Glucose-Capillary: 262 mg/dL — ABNORMAL HIGH (ref 70–99)

## 2019-08-13 LAB — C-REACTIVE PROTEIN: CRP: 1.3 mg/dL — ABNORMAL HIGH (ref <1.0)

## 2019-08-13 LAB — D-DIMER, QUANTITATIVE: D-Dimer, Quant: 0.62 ug/mL-FEU — ABNORMAL HIGH (ref 0.00–0.50)

## 2019-08-13 LAB — MAGNESIUM: Magnesium: 2.1 mg/dL (ref 1.7–2.4)

## 2019-08-13 MED ORDER — INSULIN DETEMIR 100 UNIT/ML ~~LOC~~ SOLN
8.0000 [IU] | Freq: Every day | SUBCUTANEOUS | Status: DC
  Administered 2019-08-13 – 2019-08-14 (×2): 8 [IU] via SUBCUTANEOUS
  Filled 2019-08-13 (×2): qty 0.08

## 2019-08-13 MED ORDER — SODIUM CHLORIDE 0.9 % IV SOLN
100.0000 mg | INTRAVENOUS | Status: AC
  Administered 2019-08-14: 100 mg via INTRAVENOUS
  Filled 2019-08-13: qty 20

## 2019-08-13 NOTE — Progress Notes (Signed)
Pts family updated on pts condition, questions answered, will keep monitoring

## 2019-08-13 NOTE — Progress Notes (Signed)
PROGRESS NOTE    ADELYNA BROCKMAN  YVO:[id number] DOB: 01-17-1951 DOA: 08/10/2019 PCP: Einar Pheasant, MD      Brief Narrative:  Mrs. Shipp is a 68 y.o. F with hx stroke w/o residuals, DM, HTN, BrCA remote, in remission and asthma who presented with 2 weeks cough, SOB.  In the ER, SpO2 80s, CXR showed bilateral pneumonia.      Assessment & Plan:  Coronavirus pneumonitis with acute hypoxic respiratory failure In the setting of the ongoing 2020 COVID-19 pandemic.  -Continue steroids, day 4 -Continue remdesivir, day 4 -VTE PPx with Lovenox -Continue Zinc and Vitamin C    Diabetes Glucoses elevated -Start Levemir -Continue SSI corrections -Hold metformin  Hypertension Stroke secondary preventin BP high normal -Continue aspirin, Plavix, Crestor -Continue irbesartan  Transaminitis LFTs stable  Asthma No active bronchospasm -Continue Breo  Breast cancer Remote diagnosis, in remission          MDM and disposition: The below labs and imaging reports were reviewed and summarized above.  Medication management as above.  The patient was admitted with COVID-19.  She is improving but still requires IV remdesivir.      DVT prophylaxis: Lovenox Code Status: FULL Family Communication: Will call spouse b yphone     Procedures:   CXR 9/20 - Bilateral opacities  Antimicrobials:   Ceftriaxone x1  Azithromycin x1   Culture data:   9/16 blood culture x2 NG       Subjective: Feels well.  No dyspnea with exertion, no chest tightness, no pleuritic pain.  Mild cough.  No confusion, no fever.  Objective: Vitals:   08/12/19 1100 08/12/19 1623 08/12/19 2017 08/13/19 0400  BP: 135/81 137/65 (!) 145/84 140/82  Pulse: 81 63 65   Resp: (!) '27  18 18  ' Temp: (!) 97.5 F (36.4 C) 97.9 F (36.6 C) 98.2 F (36.8 C) 99 F (37.2 C)  TempSrc: Oral Oral Oral Oral  SpO2: 94% 93% 95%   Weight:      Height:        Intake/Output Summary (Last 24  hours) at 08/13/2019 0845 Last data filed at 08/12/2019 1700 Gross per 24 hour  Intake 167.84 ml  Output -  Net 167.84 ml   Filed Weights   08/10/19 1436  Weight: 65.8 kg    Examination: General appearance: thin adult female, alert and in no acute distress.  Sitting in chair HEENT: Anicteric, conjunctiva pink, lids and lashes normal. No nasal deformity, discharge, epistaxis.  Lips moist.   Skin: Warm and dry.  no jaundice.  No suspicious rashes or lesions. Cardiac: RRR, nl S1-S2, no murmurs appreciated.  Capillary refill is brisk.  JVP normal.  No LE edema.  Radial pulses 2+ and symmetric. Respiratory: Normal respiratory rate and rhythm.  CTAB without rales or wheezes. Abdomen: Abdomen soft.  No TTP or guarding. No ascites, distension, hepatosplenomegaly.   MSK: No deformities or effusions. Neuro: Awake and alert.  EOMI, moves all extremities. Speech fluent.    Psych: Sensorium intact and responding to questions, attention normal. Affect normal.  Judgment and insight appear normal.     Data Reviewed: I have personally reviewed following labs and imaging studies:  CBC: Recent Labs  Lab 08/06/19 2059 08/10/19 0113 08/11/19 0230 08/12/19 0155 08/13/19 0205  WBC 6.3 4.0 4.0 6.2 7.4  NEUTROABS 5.0 2.5 3.0 4.6 5.7  HGB 13.8 13.5 13.1 12.8 12.1  HCT 40.4 40.6 40.6 38.7 36.7  MCV 85.8 87.3 90.6 88.4 88.6  PLT 148* 244 278 285 500   Basic Metabolic Panel: Recent Labs  Lab 08/06/19 2059 08/10/19 0113 08/11/19 0230 08/12/19 0155 08/13/19 0205  NA 139 139 140 138 138  K 3.3* 3.0* 4.7 4.4 4.5  CL 98 98 100 99 100  CO2 '27 30 28 27 26  ' GLUCOSE 123* 117* 159* 165* 163*  BUN '18 22 22 ' 24* 25*  CREATININE 0.66 0.60 0.59 0.55 0.61  CALCIUM 9.2 8.9 9.3 9.0 8.8*  MG  --   --  2.1 2.1 2.1   GFR: Estimated Creatinine Clearance: 61.4 mL/min (by C-G formula based on SCr of 0.61 mg/dL). Liver Function Tests: Recent Labs  Lab 08/06/19 2059 08/10/19 0113 08/11/19 0230 08/12/19  0155 08/13/19 0205  AST 28 68* 48* 32 27  ALT 23 124* 99* 83* 71*  ALKPHOS 61 100 85 74 73  BILITOT 0.9 1.0 0.7 0.5 0.5  PROT 7.6 7.2 7.1 6.8 6.5  ALBUMIN 4.0 3.3* 3.2* 3.1* 3.0*   No results for input(s): LIPASE, AMYLASE in the last 168 hours. No results for input(s): AMMONIA in the last 168 hours. Coagulation Profile: No results for input(s): INR, PROTIME in the last 168 hours. Cardiac Enzymes: No results for input(s): CKTOTAL, CKMB, CKMBINDEX, TROPONINI in the last 168 hours. BNP (last 3 results) No results for input(s): PROBNP in the last 8760 hours. HbA1C: No results for input(s): HGBA1C in the last 72 hours. CBG: Recent Labs  Lab 08/11/19 2142 08/12/19 0856 08/12/19 1222 08/12/19 1624 08/12/19 2139  GLUCAP 226* 185* 235* 169* 273*   Lipid Profile: No results for input(s): CHOL, HDL, LDLCALC, TRIG, CHOLHDL, LDLDIRECT in the last 72 hours. Thyroid Function Tests: No results for input(s): TSH, T4TOTAL, FREET4, T3FREE, THYROIDAB in the last 72 hours. Anemia Panel: Recent Labs    08/12/19 0155 08/13/19 0205  FERRITIN 492* 450*   Urine analysis:    Component Value Date/Time   COLORURINE Yellow 12/04/2012 1521   APPEARANCEUR Hazy 12/04/2012 1521   LABSPEC 1.031 12/04/2012 1521   PHURINE 5.0 12/04/2012 1521   GLUCOSEU >=500 12/04/2012 1521   HGBUR Negative 12/04/2012 1521   BILIRUBINUR Negative 12/04/2012 1521   KETONESUR 1+ 12/04/2012 1521   PROTEINUR Negative 12/04/2012 1521   NITRITE Negative 12/04/2012 1521   LEUKOCYTESUR Negative 12/04/2012 1521   Sepsis Labs: '@LABRCNTIP' (procalcitonin:4,lacticacidven:4)  ) Recent Results (from the past 240 hour(s))  Culture, blood (routine x 2)     Status: None   Collection Time: 08/06/19  8:59 PM   Specimen: BLOOD  Result Value Ref Range Status   Specimen Description BLOOD RIGHT ANTECUBITAL  Final   Special Requests   Final    BOTTLES DRAWN AEROBIC AND ANAEROBIC Blood Culture adequate volume   Culture   Final     NO GROWTH 5 DAYS Performed at [facility], [address] [place], [place] [address]    Report Status 08/11/2019 FINAL  Final  Culture, blood (routine x 2)     Status: None   Collection Time: 08/06/19  9:04 PM   Specimen: BLOOD  Result Value Ref Range Status   Specimen Description BLOOD BLOOD LEFT FOREARM  Final   Special Requests   Final    BOTTLES DRAWN AEROBIC AND ANAEROBIC Blood Culture adequate volume   Culture   Final    NO GROWTH 5 DAYS Performed at [facility], [address], [place], [place] [address]    Report Status 08/11/2019 FINAL  Final  Radiology Studies: No results found.      Scheduled Meds: . aspirin EC  81 mg Oral Daily  . clopidogrel  75 mg Oral Daily  . docusate sodium  100 mg Oral BID  . enoxaparin (LOVENOX) injection  40 mg Subcutaneous Q24H  . feeding supplement (ENSURE ENLIVE)  237 mL Oral BID BM  . fluticasone furoate-vilanterol  1 puff Inhalation Daily  . insulin aspart  0-20 Units Subcutaneous TID WC  . insulin aspart  0-5 Units Subcutaneous QHS  . irbesartan  75 mg Oral Daily  . methylPREDNISolone (SOLU-MEDROL) injection  40 mg Intravenous Q12H  . pantoprazole  40 mg Oral Daily  . rosuvastatin  10 mg Oral Daily  . sodium chloride flush  3 mL Intravenous Q12H  . vitamin C  500 mg Oral Daily  . zinc sulfate  220 mg Oral Daily   Continuous Infusions: . sodium chloride    . remdesivir 100 mg in NS 250 mL 500 mL/hr at 08/12/19 1700     LOS: 3 days    Time spent: 25 minutes      Edwin Dada, MD [facility] 08/13/2019, 8:45 AM     Please page through [place]:  [email or web address] Contact charge nurse for password If 7PM-7AM, please contact night-coverage

## 2019-08-14 ENCOUNTER — Other Ambulatory Visit: Payer: Medicare HMO

## 2019-08-14 LAB — GLUCOSE, CAPILLARY
Glucose-Capillary: 134 mg/dL — ABNORMAL HIGH (ref 70–99)
Glucose-Capillary: 203 mg/dL — ABNORMAL HIGH (ref 70–99)

## 2019-08-14 MED ORDER — ASCORBIC ACID 500 MG PO TABS: 500.0000 mg | ORAL_TABLET | Freq: Every day | ORAL | 0 refills | Status: DC

## 2019-08-14 MED ORDER — PREDNISONE 10 MG PO TABS: ORAL_TABLET | ORAL | 0 refills | Status: AC

## 2019-08-14 MED ORDER — ZINC SULFATE 220 (50 ZN) MG PO CAPS: 220.0000 mg | ORAL_CAPSULE | Freq: Every day | ORAL | 0 refills | Status: DC

## 2019-08-14 NOTE — Discharge Summary (Signed)
Physician Discharge Summary  Angel Floyd [id number] DOB: [birth year] DOA: 08/10/2019  PCP: Einar Pheasant, MD  Admit date: 08/10/2019 Discharge date: 08/14/2019  Admitted From: Home Disposition: Home  Recommendations for Outpatient Follow-up:  1. Follow up with PCP in 1-2 weeks 2. Please obtain BMP/CBC in one week  Discharge Condition: Stable CODE STATUS: Full Diet recommendation: As tolerated  Brief/Interim Summary: Angel Floyd is a 68 y.o. female with a past medical history of asthma, stroke, diabetes mellitus type 2, essential hypertension, remote history of breast cancer in remission, lives with her husband.  Her husband tested positive for COVID about 2 weeks ago.  She started developing symptoms.  She was originally tested for COVID on September 10 when it was negative.  Her symptoms persisted and she started developing shortness of breath and cough.  She went to her primary care provider office on September 15 and was retested and that time she was positive.  She was told us to observe herself at home.  She was prescribed Augmentin for possible bacterial pneumonia.  Over the last 24 hours she has been checking her oxygen saturations at home and they have been measuring 82 to 86%.  She started getting worsening shortness of breath.  So she decided to come back to the emergency department.  She was noted to have oxygen saturations of 85%.  She was placed on oxygen.  She mentioned coughing up a yellowish expectoration.  Denies orthopnea or PND.  Occasional chest tightness.  No headaches.  No nausea vomiting.  No diarrhea. In the emergency department chest x-ray was done which showed bilateral pneumonia.  She was noted to be hypoxic as discussed above.  She was hospitalized for further management.  Originally seen at [facility].  Subsequently transferred to [facility].    Patient admitted as above with acute hypoxic respiratory failure in the setting of COVID-19 pneumonia.   Patient has now completed her Remdesivir course, we will continue patient steroids at discharge for 10 days per protocol.  Patient's glucose remains moderately labile in the setting of ongoing steroids.  Patient will need to monitor these closely and continue to be very diligent with her diabetic diet.  Otherwise now on room air, ambulating without difficulty, appears quite well, requesting discharge which is certainly reasonable given completion of Remdesivir and resolution of hypoxia.  Close follow-up with PCP in the next 1 to 2 weeks for repeat evaluation labs and to ensure her diabetes is well managed.  She otherwise stable and agreeable for discharge home.  Discharge Diagnoses:  Principal Problem:   Pneumonia due to COVID-19 virus Active Problems:   Hypertension   Type 2 diabetes mellitus with neurological complications (HCC)   History of breast cancer   Acute respiratory failure with hypoxia (HCC)   History of stroke   Transaminitis    Discharge Instructions  Discharge Instructions    Call MD for:  difficulty breathing, headache or visual disturbances   Complete by: As directed    Call MD for:  extreme fatigue   Complete by: As directed    Call MD for:  hives   Complete by: As directed    Call MD for:  persistant dizziness or light-headedness   Complete by: As directed    Call MD for:  persistant nausea and vomiting   Complete by: As directed    Call MD for:  redness, tenderness, or signs of infection (pain, swelling, redness, odor or green/yellow discharge around incision site)  Complete by: As directed    Call MD for:  severe uncontrolled pain   Complete by: As directed    Call MD for:  temperature >100.4   Complete by: As directed    Diet - low sodium heart healthy   Complete by: As directed    Increase activity slowly   Complete by: As directed      Allergies as of 08/14/2019      Reactions   Aggrenox [aspirin-dipyridamole Er]    Photosensitivity, "felt awful"       Medication List    STOP taking these medications   amoxicillin-clavulanate 875-125 MG tablet Commonly known as: AUGMENTIN   azithromycin 250 MG tablet Commonly known as: ZITHROMAX   benzonatate 200 MG capsule Commonly known as: TESSALON     TAKE these medications   albuterol 108 (90 Base) MCG/ACT inhaler Commonly known as: VENTOLIN HFA Inhale 2 puffs into the lungs every 4 (four) hours as needed for wheezing.   ascorbic acid 500 MG tablet Commonly known as: VITAMIN C Take 1 tablet (500 mg total) by mouth daily. Start taking on: August 15, 2019   aspirin 81 MG tablet Take 81 mg by mouth daily.   clopidogrel 75 MG tablet Commonly known as: PLAVIX TAKE 1 TABLET DAILY   fish oil-omega-3 fatty acids 1000 MG capsule Take 2 g by mouth daily.   fluticasone furoate-vilanterol 200-25 MCG/INH Aepb Commonly known as: Breo Ellipta Inhale 1 puff into the lungs daily. What changed:   when to take this  reasons to take this   glucose blood test strip Use as instructed to check blood sugars three times daily. Has One Touch Ultra glucometer. Dx 250.00   Integra 62.5-62.5-40-3 MG Caps Take 1 capsule by mouth daily.   metFORMIN 500 MG tablet Commonly known as: GLUCOPHAGE TAKE 1 TABLET DAILY What changed: when to take this   pantoprazole 40 MG tablet Commonly known as: PROTONIX Take 1 tablet (40 mg total) by mouth daily.   predniSONE 10 MG tablet Commonly known as: DELTASONE Take 4 tablets (40 mg total) by mouth daily for 3 days, THEN 3 tablets (30 mg total) daily for 3 days, THEN 2 tablets (20 mg total) daily for 3 days, THEN 1 tablet (10 mg total) daily for 3 days. Start taking on: August 14, 2019 What changed:   medication strength  See the new instructions.   rosuvastatin 10 MG tablet Commonly known as: CRESTOR TAKE 1 TABLET DAILY   telmisartan 20 MG tablet Commonly known as: MICARDIS TAKE 1 TABLET DAILY   zinc sulfate 220 (50 Zn) MG capsule Take  1 capsule (220 mg total) by mouth daily. Start taking on: August 15, 2019       Allergies  Allergen Reactions  . Aggrenox [Aspirin-Dipyridamole Er]     Photosensitivity, "felt awful"    Procedures/Studies: Dg Chest Portable 1 View  Result Date: 08/10/2019 CLINICAL DATA:  Pneumonia EXAM: PORTABLE CHEST 1 VIEW COMPARISON:  08/06/2019 FINDINGS: There has been interval development of multifocal airspace opacities bilaterally. There is no pneumothorax. No large pleural effusion. The heart size is stable. Aortic calcifications are noted. There is no acute osseous abnormality. IMPRESSION: Interval development of multifocal airspace opacities bilaterally compatible with multifocal pneumonia (viral or bacterial). Electronically Signed   By: Constance Holster M.D.   On: 08/10/2019 01:36   Dg Chest Portable 1 View  Result Date: 08/06/2019 CLINICAL DATA:  68 year old female with shortness of breath EXAM: PORTABLE CHEST 1 VIEW COMPARISON:  Prior chest x-ray 10/31/2018 FINDINGS: Stable mild cardiomegaly. Mediastinal contours are within normal limits. Mild vascular congestion without overt edema. Spur a tori volumes are slightly low on today's examination. Stable chronic bronchitic changes. No pleural effusion or pneumothorax. No acute osseous abnormality. IMPRESSION: 1. Low inspiratory volumes. 2. Stable borderline cardiomegaly and pulmonary vascular congestion without overt edema. Electronically Signed   By: Jacqulynn Cadet M.D.   On: 08/06/2019 20:59    Subjective: No acute issues or events overnight, denies chest pain, shortness of breath, nausea, vomiting, diarrhea, constipation, headache, fevers, chills.   Discharge Exam: Vitals:   08/14/19 0500 08/14/19 0827  BP: (!) 142/76 115/71  Pulse:    Resp:    Temp: 98.1 F (36.7 C) (!) 97.5 F (36.4 C)  SpO2:     Vitals:   08/13/19 1604 08/13/19 2005 08/14/19 0500 08/14/19 0827  BP: 125/76 123/73 (!) 142/76 115/71  Pulse: 60     Resp: 15  18    Temp: 98 F (36.7 C) 98.3 F (36.8 C) 98.1 F (36.7 C) (!) 97.5 F (36.4 C)  TempSrc: Oral Oral Oral   SpO2: 95%     Weight:      Height:        General:  Pleasantly resting in bed, No acute distress. HEENT:  Normocephalic atraumatic.  Sclerae nonicteric, noninjected.  Extraocular movements intact bilaterally. Neck:  Without mass or deformity.  Trachea is midline. Lungs:  Clear to auscultate bilaterally without rhonchi, wheeze, or rales. Heart:  Regular rate and rhythm.  Without murmurs, rubs, or gallops. Abdomen:  Soft, nontender, nondistended.  Without guarding or rebound. Extremities: Without cyanosis, clubbing, edema, or obvious deformity. Vascular:  Dorsalis pedis and posterior tibial pulses palpable bilaterally. Skin:  Warm and dry, no erythema, no ulcerations.   The results of significant diagnostics from this hospitalization (including imaging, microbiology, ancillary and laboratory) are listed below for reference.     Microbiology: Recent Results (from the past 240 hour(s))  Culture, blood (routine x 2)     Status: None   Collection Time: 08/06/19  8:59 PM   Specimen: BLOOD  Result Value Ref Range Status   Specimen Description BLOOD RIGHT ANTECUBITAL  Final   Special Requests   Final    BOTTLES DRAWN AEROBIC AND ANAEROBIC Blood Culture adequate volume   Culture   Final    NO GROWTH 5 DAYS Performed at [facility], [address] [place], [place] [address]    Report Status 08/11/2019 FINAL  Final  Culture, blood (routine x 2)     Status: None   Collection Time: 08/06/19  9:04 PM   Specimen: BLOOD  Result Value Ref Range Status   Specimen Description BLOOD BLOOD LEFT FOREARM  Final   Special Requests   Final    BOTTLES DRAWN AEROBIC AND ANAEROBIC Blood Culture adequate volume   Culture   Final    NO GROWTH 5 DAYS Performed at [facility], [address] [place], [place] [address]    Report Status 08/11/2019 FINAL  Final      Labs:  Basic Metabolic Panel: Recent Labs  Lab 08/10/19 0113 08/11/19 0230 08/12/19 0155 08/13/19 0205  NA 139 140 138 138  K 3.0* 4.7 4.4 4.5  CL 98 100 99 100  CO2 30 28 27 26   GLUCOSE 117* 159* 165* 163*  BUN 22 22 24* 25*  CREATININE 0.60 0.59 0.55 0.61  CALCIUM 8.9 9.3 9.0 8.8*  MG  --  2.1  2.1 2.1   Liver Function Tests: Recent Labs  Lab 08/10/19 0113 08/11/19 0230 08/12/19 0155 08/13/19 0205  AST 68* 48* 32 27  ALT 124* 99* 83* 71*  ALKPHOS 100 85 74 73  BILITOT 1.0 0.7 0.5 0.5  PROT 7.2 7.1 6.8 6.5  ALBUMIN 3.3* 3.2* 3.1* 3.0*   CBC: Recent Labs  Lab 08/10/19 0113 08/11/19 0230 08/12/19 0155 08/13/19 0205  WBC 4.0 4.0 6.2 7.4  NEUTROABS 2.5 3.0 4.6 5.7  HGB 13.5 13.1 12.8 12.1  HCT 40.6 40.6 38.7 36.7  MCV 87.3 90.6 88.4 88.6  PLT 244 278 285 338   CBG: Recent Labs  Lab 08/13/19 0904 08/13/19 1155 08/13/19 1600 08/13/19 2045 08/14/19 0829  GLUCAP 179* 257* 236* 262* 134*   D-Dimer Recent Labs    08/12/19 0155 08/13/19 0205  DDIMER 0.59* 0.62*   Anemia work up Recent Labs    08/12/19 0155 08/13/19 0205  FERRITIN 492* 450*   Urinalysis    Component Value Date/Time   COLORURINE Yellow 12/04/2012 1521   APPEARANCEUR Hazy 12/04/2012 1521   LABSPEC 1.031 12/04/2012 1521   PHURINE 5.0 12/04/2012 1521   GLUCOSEU >=500 12/04/2012 1521   HGBUR Negative 12/04/2012 1521   BILIRUBINUR Negative 12/04/2012 1521   KETONESUR 1+ 12/04/2012 1521   PROTEINUR Negative 12/04/2012 1521   NITRITE Negative 12/04/2012 1521   LEUKOCYTESUR Negative 12/04/2012 1521   Microbiology Recent Results (from the past 240 hour(s))  Culture, blood (routine x 2)     Status: None   Collection Time: 08/06/19  8:59 PM   Specimen: BLOOD  Result Value Ref Range Status   Specimen Description BLOOD RIGHT ANTECUBITAL  Final   Special Requests   Final    BOTTLES DRAWN AEROBIC AND ANAEROBIC Blood Culture adequate volume   Culture   Final    NO GROWTH 5  DAYS Performed at [facility], [address] [place], [place] [address]    Report Status 08/11/2019 FINAL  Final  Culture, blood (routine x 2)     Status: None   Collection Time: 08/06/19  9:04 PM   Specimen: BLOOD  Result Value Ref Range Status   Specimen Description BLOOD BLOOD LEFT FOREARM  Final   Special Requests   Final    BOTTLES DRAWN AEROBIC AND ANAEROBIC Blood Culture adequate volume   Culture   Final    NO GROWTH 5 DAYS Performed at [facility], [address], [place], [place] [address]    Report Status 08/11/2019 FINAL  Final     Time coordinating discharge: Over 30 minutes  SIGNED:   Little Ishikawa, DO [facility] 08/14/2019, 11:15 AM Pager   If 7PM-7AM, please contact night-coverage [email or web address] Password [email or web address]

## 2019-08-14 NOTE — TOC Transition Note (Signed)
Transition of Care [facility]) - CM/SW Discharge Note   Patient Details  Name: Angel Floyd MRN: [id number] Date of Birth: Oct 23, 1951  [facility] CM/SW Contact:  Ninfa Meeker, RN Phone Number: [phone] (working remotely) 08/14/2019, 11:35 AM   Clinical Narrative:  68 yr old female admitted and treated for COVID 19. Thankfully patient is improving and will discharge home today. No Home Health needs identified.         Barriers to Discharge: Continued Medical Work up   Patient Goals and CMS Choice        Discharge Placement                       Discharge Plan and Services                                     Social Determinants of Health (SDOH) Interventions     Readmission Risk Interventions No flowsheet data found.

## 2019-08-18 ENCOUNTER — Encounter: Payer: Medicare HMO | Admitting: Internal Medicine

## 2019-08-18 ENCOUNTER — Telehealth: Payer: Self-pay

## 2019-08-18 NOTE — Telephone Encounter (Signed)
Transition Care Management Follow-up Telephone Call   Date discharged? 08/14/19   How have you been since you were released from the hospital? Patient states, "I am feeling so much better, but moving slow." Cough present when seated and resting; very little clear expectoration. Using spirometer every 1-2 hours. Denies chest tightness, pain, headache, dizziness, N/V/D. Appetite slowly increasing. Fluid intake is good.    Do you understand why you were in the hospital? Yes, pneumonia due to Covid-19 virus.   Do you understand the discharge instructions? Yes, follow up with pcp 1-2 weeks, obtain BMP/CBC.    Where were you discharged to? Home.   Items Reviewed:  Medications reviewed: Yes, taking all medications as directed. No problems.   Allergies reviewed: Yes, no new allergies  Dietary changes reviewed: Low carb, low sodium heart healthy.  Referrals reviewed: Yes, follow up with cardiologist.   Functional Questionnaire:   Activities of Daily Living (ADLs):   She states they are independent in the following: Independent in all ADLs at this time.  States they require assistance with the following: No assistance required at this time.    Any transportation issues/concerns?: No.   Any patient concerns? Is it ok to take Day-Quil for cough prn.    Confirmed importance and date/time of follow-up visits scheduled Yes, virtual appointment scheduled 08/28/19 at 8:00 [email or web address]. Patient notes she is flexible to change appointment as deemed appropriate.   Provider Appointment booked with Dr. Nicki Reaper, pcp.   Confirmed with patient if condition begins to worsen call PCP or go to the ER.  Patient was given the office number and encouraged to call back with question or concerns.  : Yes

## 2019-08-28 ENCOUNTER — Other Ambulatory Visit: Payer: Self-pay

## 2019-08-28 ENCOUNTER — Other Ambulatory Visit
Admission: RE | Admit: 2019-08-28 | Discharge: 2019-08-28 | Disposition: A | Discharge status: Home / Self Care | Payer: Medicare HMO | Source: Ambulatory Visit | Attending: Internal Medicine | Admitting: Internal Medicine

## 2019-08-28 ENCOUNTER — Ambulatory Visit (INDEPENDENT_AMBULATORY_CARE_PROVIDER_SITE_OTHER): Payer: Medicare HMO | Admitting: Internal Medicine

## 2019-08-28 ENCOUNTER — Encounter: Payer: Self-pay | Admitting: Internal Medicine

## 2019-08-28 VITALS — BP 132/65 | Wt 144.0 lb

## 2019-08-28 DIAGNOSIS — J1282 Pneumonia due to coronavirus disease 2019: Secondary | ICD-10-CM

## 2019-08-28 DIAGNOSIS — J1289 Other viral pneumonia: Secondary | ICD-10-CM

## 2019-08-28 DIAGNOSIS — U071 COVID-19: Secondary | ICD-10-CM | POA: Diagnosis not present

## 2019-08-28 DIAGNOSIS — I10 Essential (primary) hypertension: Secondary | ICD-10-CM

## 2019-08-28 DIAGNOSIS — J9601 Acute respiratory failure with hypoxia: Secondary | ICD-10-CM

## 2019-08-28 DIAGNOSIS — R7989 Other specified abnormal findings of blood chemistry: Secondary | ICD-10-CM | POA: Insufficient documentation

## 2019-08-28 DIAGNOSIS — R945 Abnormal results of liver function studies: Secondary | ICD-10-CM | POA: Diagnosis not present

## 2019-08-28 DIAGNOSIS — E1149 Type 2 diabetes mellitus with other diabetic neurological complication: Secondary | ICD-10-CM

## 2019-08-28 DIAGNOSIS — D508 Other iron deficiency anemias: Secondary | ICD-10-CM

## 2019-08-28 LAB — CBC WITH DIFFERENTIAL/PLATELET
Abs Immature Granulocytes: 0.01 10*3/uL (ref 0.00–0.07)
Basophils Absolute: 0 10*3/uL (ref 0.0–0.1)
Basophils Relative: 0 %
Eosinophils Absolute: 0.1 10*3/uL (ref 0.0–0.5)
Eosinophils Relative: 2 %
HCT: 39 % (ref 36.0–46.0)
Hemoglobin: 12.8 g/dL (ref 12.0–15.0)
Immature Granulocytes: 0 %
Lymphocytes Relative: 34 %
Lymphs Abs: 1.6 10*3/uL (ref 0.7–4.0)
MCH: 29.8 pg (ref 26.0–34.0)
MCHC: 32.8 g/dL (ref 30.0–36.0)
MCV: 90.7 fL (ref 80.0–100.0)
Monocytes Absolute: 0.3 10*3/uL (ref 0.1–1.0)
Monocytes Relative: 7 %
Neutro Abs: 2.6 10*3/uL (ref 1.7–7.7)
Neutrophils Relative %: 57 %
Platelets: 136 10*3/uL — ABNORMAL LOW (ref 150–400)
RBC: 4.3 MIL/uL (ref 3.87–5.11)
RDW: 13 % (ref 11.5–15.5)
WBC: 4.6 10*3/uL (ref 4.0–10.5)
nRBC: 0 % (ref 0.0–0.2)

## 2019-08-28 LAB — BASIC METABOLIC PANEL
Anion gap: 7 (ref 5–15)
BUN: 14 mg/dL (ref 8–23)
CO2: 30 mmol/L (ref 22–32)
Calcium: 9 mg/dL (ref 8.9–10.3)
Chloride: 104 mmol/L (ref 98–111)
Creatinine, Ser: 0.62 mg/dL (ref 0.44–1.00)
GFR calc Af Amer: >60 mL/min (ref >60)
GFR calc non Af Amer: >60 mL/min (ref >60)
Glucose, Bld: 154 mg/dL — ABNORMAL HIGH (ref 70–99)
Potassium: 4.1 mmol/L (ref 3.5–5.1)
Sodium: 141 mmol/L (ref 135–145)

## 2019-08-28 LAB — HEPATIC FUNCTION PANEL
ALT: 19 U/L (ref 0–44)
AST: 16 U/L (ref 15–41)
Albumin: 3.7 g/dL (ref 3.5–5.0)
Alkaline Phosphatase: 57 U/L (ref 38–126)
Bilirubin, Direct: 0.1 mg/dL (ref 0.0–0.2)
Indirect Bilirubin: 0.9 mg/dL (ref 0.3–0.9)
Total Bilirubin: 1 mg/dL (ref 0.3–1.2)
Total Protein: 6.4 g/dL — ABNORMAL LOW (ref 6.5–8.1)

## 2019-08-28 NOTE — Progress Notes (Addendum)
Patient ID: Angel Floyd, female   DOB: [birth year], 68 y.o.   MRN: [id number]   Virtual Visit via video Note  This visit type was conducted due to national recommendations for restrictions regarding the COVID-19 pandemic (e.g. social distancing).  This format is felt to be most appropriate for this patient at this time.  All issues noted in this document were discussed and addressed.  No physical exam was performed (except for noted visual exam findings with Video Visits).   I connected with Arneda Chojnowski today by a video enabled telemedicine application and verified that I am speaking with the correct person using two identifiers. Location patient: home Location provider: work  Persons participating in the virtual visit: patient, provider  I discussed the limitations, risks, security and privacy concerns of performing an evaluation and management service by telephone and the availability of in person appointments.  The patient expressed understanding and agreed to proceed.   Reason for visit: hospital follow up.   HPI: She was admitted 08/10/19 - 08/14/19 pneumonia due to Covid.  Was admitted with acute hypoxic respiratory failure in the setting of covid 19 pneumonia.  Completed Remdesivir course.  Was also treated with steroids and completed these after discharge.  At discharge, off oxygen.  Ambulating.  Since being home, feels better.  Energy gradually improving.  Cough improved.  No fever.  Eating well.  No acid reflux.  No abdominal pain.  Bowels moving.  Discussed the need for f/u cxr in several weeks.  Also discussed f/u labs.  Blood sugars trending down.  Taking dayquil, nyquil and robitussin.     ROS: See pertinent positives and negatives per HPI.  Past Medical History:  Diagnosis Date  . Anemia   . Anginal pain ([place])   . Breast cancer ([place])   . Diabetes mellitus ([place])   . History of abnormal Pap smear    class III, required cryosurgery  . Hypercholesterolemia   . Hypertension    . TIA (transient ischemic attack)     Past Surgical History:  Procedure Laterality Date  . BREAST CYST ASPIRATION  1989  . COLONOSCOPY WITH PROPOFOL N/A 11/05/2017   Procedure: COLONOSCOPY WITH PROPOFOL;  Surgeon: Lollie Sails, MD;  Location: [facility] ENDOSCOPY;  Service: Endoscopy;  Laterality: N/A;  . DILATION AND CURETTAGE OF UTERUS    . ESOPHAGOGASTRODUODENOSCOPY (EGD) WITH PROPOFOL N/A 11/05/2017   Procedure: ESOPHAGOGASTRODUODENOSCOPY (EGD) WITH PROPOFOL;  Surgeon: Lollie Sails, MD;  Location[facility] ENDOSCOPY;  Service: Endoscopy;  Laterality: N/A;  . FOOT SURGERY     morton's neuroma removed - right foot  . FOOT SURGERY  10/2009   achilles tendon release with reconstructive surgery  . GYNECOLOGIC CRYOSURGERY     class III pap  . HAND SURGERY     cyst removed - left hand  . hysteroscopy and D&C  7/09  . KNEE ARTHROSCOPY Left [id number]   removed tear & resurfaced area  . KNEE SURGERY  09/20/12   torn meniscus, (Dr Leanor Kail)  . TUBAL LIGATION      Family History  Problem Relation Age of Onset  . Lung cancer Father   . Alcohol abuse Father   . Cancer Father        lung  . CVA Mother   . Colon cancer Mother   . Alcohol abuse Mother   . Cancer Mother        colon  . Stroke Mother   . Colon cancer Sister   . Diabetes  Maternal Grandfather     SOCIAL HX: reviewed.    Current Outpatient Medications:  .  albuterol (VENTOLIN HFA) 108 (90 Base) MCG/ACT inhaler, Inhale 2 puffs into the lungs every 4 (four) hours as needed for wheezing., Disp: , Rfl:  .  aspirin 81 MG tablet, Take 81 mg by mouth daily., Disp: , Rfl:  .  clopidogrel (PLAVIX) 75 MG tablet, TAKE 1 TABLET DAILY (Patient taking differently: Take 75 mg by mouth daily. ), Disp: 90 tablet, Rfl: 1 .  Fe Fum-FePoly-Vit C-Vit B3 (INTEGRA) 62.5-62.5-40-3 MG CAPS, Take 1 capsule by mouth daily., Disp: 90 capsule, Rfl: 1 .  fish oil-omega-3 fatty acids 1000 MG capsule, Take 2 g by mouth daily. , Disp: ,  Rfl:  .  fluticasone furoate-vilanterol (BREO ELLIPTA) 200-25 MCG/INH AEPB, Inhale 1 puff into the lungs daily. (Patient taking differently: Inhale 1 puff into the lungs daily as needed. ), Disp: 60 each, Rfl: 5 .  glucose blood test strip, Use as instructed to check blood sugars three times daily. Has One Touch Ultra glucometer. Dx 250.00, Disp: 100 each, Rfl: 2 .  metFORMIN (GLUCOPHAGE) 500 MG tablet, TAKE 1 TABLET DAILY (Patient taking differently: Take 500 mg by mouth daily with breakfast. ), Disp: 90 tablet, Rfl: 1 .  pantoprazole (PROTONIX) 40 MG tablet, Take 1 tablet (40 mg total) by mouth daily., Disp: 90 tablet, Rfl: 1 .  rosuvastatin (CRESTOR) 10 MG tablet, TAKE 1 TABLET DAILY, Disp: 90 tablet, Rfl: 1 .  telmisartan (MICARDIS) 20 MG tablet, TAKE 1 TABLET DAILY (Patient taking differently: Take 20 mg by mouth daily. ), Disp: 90 tablet, Rfl: 2 .  vitamin C (VITAMIN C) 500 MG tablet, Take 1 tablet (500 mg total) by mouth daily., Disp: 30 tablet, Rfl: 0 .  zinc sulfate 220 (50 Zn) MG capsule, Take 1 capsule (220 mg total) by mouth daily., Disp: 30 capsule, Rfl: 0  EXAM:  VITALS per patient if applicable:  622WLN, 989/21  GENERAL: alert, oriented, appears well and in no acute distress  HEENT: atraumatic, conjunttiva clear, no obvious abnormalities on inspection of external nose and ears  NECK: normal movements of the head and neck  LUNGS: on inspection no signs of respiratory distress, breathing rate appears normal, no obvious gross SOB, gasping or wheezing  CV: no obvious cyanosis  PSYCH/NEURO: pleasant and cooperative, no obvious depression or anxiety, speech and thought processing grossly intact  ASSESSMENT AND PLAN:  Discussed the following assessment and plan:  Abnormal liver function test Related to covid.  Recheck liver function tests.    Acute respiratory failure with hypoxia (HCC) Admitted with acute hypoxic respiratory failure secondary to covid pneumonia.  Treated.   Off oxygen.  Cough is better.  Follow.  Will need f/u cxr in several weeks.   Anemia Has a history of iron deficiency.  Had GI w/up.  Follow cbc.   Hypertension Blood pressure under good control.  Continue same medication regimen.  Follow pressures.  Follow metabolic panel.    Pneumonia due to COVID-19 virus Needs f/u cxr as outlined.  Cough better.  Off prednisone.    Type 2 diabetes mellitus with neurological complications (HCC) Sugars better.  Off prednisone.  Follow met b and a1c.     I discussed the assessment and treatment plan with the patient. The patient was provided an opportunity to ask questions and all were answered. The patient agreed with the plan and demonstrated an understanding of the instructions.   The patient was advised  to call back or seek an in-person evaluation if the symptoms worsen or if the condition fails to improve as anticipated.   Einar Pheasant, MD

## 2019-08-29 ENCOUNTER — Encounter: Payer: Self-pay | Admitting: Internal Medicine

## 2019-08-29 ENCOUNTER — Other Ambulatory Visit: Payer: Self-pay | Admitting: Internal Medicine

## 2019-08-29 DIAGNOSIS — D696 Thrombocytopenia, unspecified: Secondary | ICD-10-CM

## 2019-08-29 NOTE — Progress Notes (Signed)
Order placed for f/u cbc to be drawn at [facility]

## 2019-08-31 NOTE — Assessment & Plan Note (Signed)
Blood pressure under good control.  Continue same medication regimen.  Follow pressures.  Follow metabolic panel.   

## 2019-08-31 NOTE — Assessment & Plan Note (Signed)
Needs f/u cxr as outlined.  Cough better.  Off prednisone.

## 2019-08-31 NOTE — Assessment & Plan Note (Signed)
Has a history of iron deficiency.  Had GI w/up.  Follow cbc.

## 2019-08-31 NOTE — Assessment & Plan Note (Signed)
Admitted with acute hypoxic respiratory failure secondary to covid pneumonia.  Treated.  Off oxygen.  Cough is better.  Follow.  Will need f/u cxr in several weeks.

## 2019-08-31 NOTE — Assessment & Plan Note (Signed)
Sugars better.  Off prednisone.  Follow met b and a1c.

## 2019-08-31 NOTE — Assessment & Plan Note (Signed)
Related to covid.  Recheck liver function tests.

## 2019-08-31 NOTE — Addendum Note (Signed)
Addended by: Alisa Graff on: 08/31/2019 03:50 PM   Modules accepted: Orders

## 2019-09-04 ENCOUNTER — Other Ambulatory Visit
Admission: RE | Admit: 2019-09-04 | Discharge: 2019-09-04 | Disposition: A | Discharge status: Home / Self Care | Payer: Medicare HMO | Source: Ambulatory Visit | Attending: Internal Medicine | Admitting: Internal Medicine

## 2019-09-04 DIAGNOSIS — D696 Thrombocytopenia, unspecified: Secondary | ICD-10-CM | POA: Diagnosis not present

## 2019-09-04 LAB — CBC WITH DIFFERENTIAL/PLATELET
Abs Immature Granulocytes: 0.01 10*3/uL (ref 0.00–0.07)
Basophils Absolute: 0 10*3/uL (ref 0.0–0.1)
Basophils Relative: 1 %
Eosinophils Absolute: 0.2 10*3/uL (ref 0.0–0.5)
Eosinophils Relative: 5 %
HCT: 38.2 % (ref 36.0–46.0)
Hemoglobin: 12.4 g/dL (ref 12.0–15.0)
Immature Granulocytes: 0 %
Lymphocytes Relative: 32 %
Lymphs Abs: 1.4 10*3/uL (ref 0.7–4.0)
MCH: 29.5 pg (ref 26.0–34.0)
MCHC: 32.5 g/dL (ref 30.0–36.0)
MCV: 91 fL (ref 80.0–100.0)
Monocytes Absolute: 0.3 10*3/uL (ref 0.1–1.0)
Monocytes Relative: 6 %
Neutro Abs: 2.5 10*3/uL (ref 1.7–7.7)
Neutrophils Relative %: 56 %
Platelets: 135 10*3/uL — ABNORMAL LOW (ref 150–400)
RBC: 4.2 MIL/uL (ref 3.87–5.11)
RDW: 13.2 % (ref 11.5–15.5)
WBC: 4.4 10*3/uL (ref 4.0–10.5)
nRBC: 0 % (ref 0.0–0.2)

## 2019-09-06 ENCOUNTER — Other Ambulatory Visit: Payer: Self-pay | Admitting: Internal Medicine

## 2019-09-06 ENCOUNTER — Encounter: Payer: Self-pay | Admitting: Internal Medicine

## 2019-09-06 DIAGNOSIS — E1149 Type 2 diabetes mellitus with other diabetic neurological complication: Secondary | ICD-10-CM

## 2019-09-06 DIAGNOSIS — I10 Essential (primary) hypertension: Secondary | ICD-10-CM

## 2019-09-06 DIAGNOSIS — R945 Abnormal results of liver function studies: Secondary | ICD-10-CM

## 2019-09-06 DIAGNOSIS — R7989 Other specified abnormal findings of blood chemistry: Secondary | ICD-10-CM

## 2019-09-06 DIAGNOSIS — D508 Other iron deficiency anemias: Secondary | ICD-10-CM

## 2019-09-06 DIAGNOSIS — E7800 Pure hypercholesterolemia, unspecified: Secondary | ICD-10-CM

## 2019-09-06 NOTE — Progress Notes (Signed)
Order placed for f/u labs.  

## 2019-09-08 DIAGNOSIS — G459 Transient cerebral ischemic attack, unspecified: Secondary | ICD-10-CM | POA: Diagnosis not present

## 2019-09-08 DIAGNOSIS — U071 COVID-19: Secondary | ICD-10-CM | POA: Diagnosis not present

## 2019-09-08 DIAGNOSIS — R002 Palpitations: Secondary | ICD-10-CM | POA: Diagnosis not present

## 2019-09-08 DIAGNOSIS — J1289 Other viral pneumonia: Secondary | ICD-10-CM | POA: Diagnosis not present

## 2019-09-08 DIAGNOSIS — E1149 Type 2 diabetes mellitus with other diabetic neurological complication: Secondary | ICD-10-CM | POA: Diagnosis not present

## 2019-09-08 DIAGNOSIS — R0602 Shortness of breath: Secondary | ICD-10-CM | POA: Diagnosis not present

## 2019-09-08 DIAGNOSIS — E7800 Pure hypercholesterolemia, unspecified: Secondary | ICD-10-CM | POA: Diagnosis not present

## 2019-09-08 DIAGNOSIS — R0989 Other specified symptoms and signs involving the circulatory and respiratory systems: Secondary | ICD-10-CM | POA: Diagnosis not present

## 2019-09-09 DIAGNOSIS — M25511 Pain in right shoulder: Secondary | ICD-10-CM | POA: Diagnosis not present

## 2019-09-10 ENCOUNTER — Encounter: Payer: Self-pay | Admitting: Internal Medicine

## 2019-09-10 NOTE — Telephone Encounter (Signed)
Patient wanting to make sure you are agreeable with her taking meloxicam and flexeril for her shoulder.

## 2019-09-18 ENCOUNTER — Telehealth: Payer: Self-pay | Admitting: Internal Medicine

## 2019-09-18 DIAGNOSIS — D696 Thrombocytopenia, unspecified: Secondary | ICD-10-CM

## 2019-09-18 DIAGNOSIS — J1289 Other viral pneumonia: Secondary | ICD-10-CM

## 2019-09-18 DIAGNOSIS — U071 COVID-19: Secondary | ICD-10-CM

## 2019-09-18 NOTE — Telephone Encounter (Signed)
Patient is calling to ask- where does Dr. Nicki Reaper want her to go for her chest x-ray? And where does she need her to schedule? Cb- [id number].

## 2019-09-19 NOTE — Telephone Encounter (Signed)
LMTCB

## 2019-09-19 NOTE — Telephone Encounter (Signed)
She does not need the fasting labs.  They checked an a1c when she was in the hospital.  Please reschedule these to 1-2 days before her 10/2019 appt.  She does need a f/u cbc and cxr.  I had wanted to repeat in several weeks from last visit.  If agreeable, I can order cxr and cbc to be done at the hospital and she can go over in the next 1-2 weeks.  No appt needed.

## 2019-09-19 NOTE — Telephone Encounter (Signed)
Are we going to do her f/u labs and cxr at [facility]?

## 2019-09-22 NOTE — Telephone Encounter (Signed)
Patient is going to go to get cxr and cbc at the hospital this Wednesday. She is going out of town this Thursday. I have moved her fasting labs

## 2019-09-22 NOTE — Telephone Encounter (Signed)
Order placed for cbc and cxr to be done at hospital.

## 2019-09-22 NOTE — Telephone Encounter (Signed)
Pt returned Trisha's call but I was not able to connect to her/ please advise

## 2019-09-22 NOTE — Telephone Encounter (Signed)
LMTCB

## 2019-09-22 NOTE — Telephone Encounter (Signed)
° °  Pt return Trisha cal and said u can  speak with her husband if she not home

## 2019-09-23 ENCOUNTER — Encounter: Payer: Self-pay | Admitting: Internal Medicine

## 2019-09-23 ENCOUNTER — Other Ambulatory Visit: Payer: Self-pay | Admitting: Internal Medicine

## 2019-09-23 ENCOUNTER — Other Ambulatory Visit: Payer: Self-pay

## 2019-09-23 DIAGNOSIS — R0602 Shortness of breath: Secondary | ICD-10-CM | POA: Diagnosis not present

## 2019-09-23 DIAGNOSIS — E7800 Pure hypercholesterolemia, unspecified: Secondary | ICD-10-CM | POA: Diagnosis not present

## 2019-09-23 DIAGNOSIS — G459 Transient cerebral ischemic attack, unspecified: Secondary | ICD-10-CM | POA: Diagnosis not present

## 2019-09-23 DIAGNOSIS — E1149 Type 2 diabetes mellitus with other diabetic neurological complication: Secondary | ICD-10-CM | POA: Diagnosis not present

## 2019-09-23 MED ORDER — ROSUVASTATIN CALCIUM 10 MG PO TABS: 10.0000 mg | ORAL_TABLET | Freq: Every day | ORAL | 1 refills | Status: DC

## 2019-09-23 MED ORDER — CLOPIDOGREL BISULFATE 75 MG PO TABS: 75.0000 mg | ORAL_TABLET | Freq: Every day | ORAL | 1 refills | Status: DC

## 2019-09-24 ENCOUNTER — Encounter: Payer: Self-pay | Admitting: Internal Medicine

## 2019-09-24 ENCOUNTER — Ambulatory Visit
Admission: RE | Admit: 2019-09-24 | Discharge: 2019-09-24 | Disposition: A | Discharge status: Home / Self Care | Payer: Medicare HMO | Source: Ambulatory Visit | Attending: Internal Medicine | Admitting: Internal Medicine

## 2019-09-24 ENCOUNTER — Other Ambulatory Visit
Admission: RE | Admit: 2019-09-24 | Discharge: 2019-09-24 | Disposition: A | Discharge status: Home / Self Care | Payer: Medicare HMO | Source: Ambulatory Visit | Attending: Internal Medicine | Admitting: Internal Medicine

## 2019-09-24 DIAGNOSIS — R7989 Other specified abnormal findings of blood chemistry: Secondary | ICD-10-CM

## 2019-09-24 DIAGNOSIS — E1149 Type 2 diabetes mellitus with other diabetic neurological complication: Secondary | ICD-10-CM

## 2019-09-24 DIAGNOSIS — R945 Abnormal results of liver function studies: Secondary | ICD-10-CM

## 2019-09-24 DIAGNOSIS — U071 COVID-19: Secondary | ICD-10-CM | POA: Insufficient documentation

## 2019-09-24 DIAGNOSIS — J1282 Pneumonia due to coronavirus disease 2019: Secondary | ICD-10-CM

## 2019-09-24 DIAGNOSIS — I10 Essential (primary) hypertension: Secondary | ICD-10-CM

## 2019-09-24 DIAGNOSIS — J1289 Other viral pneumonia: Secondary | ICD-10-CM | POA: Diagnosis not present

## 2019-09-24 DIAGNOSIS — E7800 Pure hypercholesterolemia, unspecified: Secondary | ICD-10-CM | POA: Diagnosis present

## 2019-09-24 DIAGNOSIS — B9729 Other coronavirus as the cause of diseases classified elsewhere: Secondary | ICD-10-CM | POA: Diagnosis not present

## 2019-09-24 LAB — CBC WITH DIFFERENTIAL/PLATELET
Abs Immature Granulocytes: 0.01 10*3/uL (ref 0.00–0.07)
Basophils Absolute: 0 10*3/uL (ref 0.0–0.1)
Basophils Relative: 1 %
Eosinophils Absolute: 0.1 10*3/uL (ref 0.0–0.5)
Eosinophils Relative: 2 %
HCT: 38.9 % (ref 36.0–46.0)
Hemoglobin: 12.8 g/dL (ref 12.0–15.0)
Immature Granulocytes: 0 %
Lymphocytes Relative: 24 %
Lymphs Abs: 1.2 10*3/uL (ref 0.7–4.0)
MCH: 29.6 pg (ref 26.0–34.0)
MCHC: 32.9 g/dL (ref 30.0–36.0)
MCV: 90 fL (ref 80.0–100.0)
Monocytes Absolute: 0.3 10*3/uL (ref 0.1–1.0)
Monocytes Relative: 7 %
Neutro Abs: 3.2 10*3/uL (ref 1.7–7.7)
Neutrophils Relative %: 66 %
Platelets: 195 10*3/uL (ref 150–400)
RBC: 4.32 MIL/uL (ref 3.87–5.11)
RDW: 12.9 % (ref 11.5–15.5)
WBC: 4.9 10*3/uL (ref 4.0–10.5)
nRBC: 0 % (ref 0.0–0.2)

## 2019-09-24 LAB — BASIC METABOLIC PANEL
Anion gap: 10 (ref 5–15)
BUN: 20 mg/dL (ref 8–23)
CO2: 27 mmol/L (ref 22–32)
Calcium: 9.1 mg/dL (ref 8.9–10.3)
Chloride: 106 mmol/L (ref 98–111)
Creatinine, Ser: 0.72 mg/dL (ref 0.44–1.00)
GFR calc Af Amer: >60 mL/min (ref >60)
GFR calc non Af Amer: >60 mL/min (ref >60)
Glucose, Bld: 131 mg/dL — ABNORMAL HIGH (ref 70–99)
Potassium: 3.9 mmol/L (ref 3.5–5.1)
Sodium: 143 mmol/L (ref 135–145)

## 2019-09-24 LAB — LIPID PANEL
Cholesterol: 159 mg/dL (ref 0–200)
HDL: 66 mg/dL (ref >40)
LDL Cholesterol: 85 mg/dL (ref 0–99)
Total CHOL/HDL Ratio: 2.4 RATIO
Triglycerides: 38 mg/dL (ref <150)
VLDL: 8 mg/dL (ref 0–40)

## 2019-09-24 LAB — TSH: TSH: 1.485 u[IU]/mL (ref 0.350–4.500)

## 2019-09-24 LAB — HEPATIC FUNCTION PANEL
ALT: 18 U/L (ref 0–44)
AST: 20 U/L (ref 15–41)
Albumin: 4 g/dL (ref 3.5–5.0)
Alkaline Phosphatase: 54 U/L (ref 38–126)
Bilirubin, Direct: <0.1 mg/dL (ref 0.0–0.2)
Total Bilirubin: 0.9 mg/dL (ref 0.3–1.2)
Total Protein: 6.6 g/dL (ref 6.5–8.1)

## 2019-09-24 LAB — HEMOGLOBIN A1C
Hgb A1c MFr Bld: 6.9 % — ABNORMAL HIGH (ref 4.8–5.6)
Mean Plasma Glucose: 151.33 mg/dL

## 2019-09-27 ENCOUNTER — Encounter: Payer: Self-pay | Admitting: Internal Medicine

## 2019-09-29 ENCOUNTER — Other Ambulatory Visit: Payer: Medicare HMO

## 2019-09-30 DIAGNOSIS — R0602 Shortness of breath: Secondary | ICD-10-CM | POA: Diagnosis not present

## 2019-09-30 DIAGNOSIS — E7800 Pure hypercholesterolemia, unspecified: Secondary | ICD-10-CM | POA: Diagnosis not present

## 2019-09-30 DIAGNOSIS — E1149 Type 2 diabetes mellitus with other diabetic neurological complication: Secondary | ICD-10-CM | POA: Diagnosis not present

## 2019-09-30 DIAGNOSIS — G459 Transient cerebral ischemic attack, unspecified: Secondary | ICD-10-CM | POA: Diagnosis not present

## 2019-09-30 DIAGNOSIS — I358 Other nonrheumatic aortic valve disorders: Secondary | ICD-10-CM | POA: Diagnosis not present

## 2019-10-01 DIAGNOSIS — R69 Illness, unspecified: Secondary | ICD-10-CM | POA: Diagnosis not present

## 2019-10-08 DIAGNOSIS — E119 Type 2 diabetes mellitus without complications: Secondary | ICD-10-CM | POA: Diagnosis not present

## 2019-10-08 LAB — HM DIABETES EYE EXAM

## 2019-10-21 DIAGNOSIS — M25511 Pain in right shoulder: Secondary | ICD-10-CM | POA: Diagnosis not present

## 2019-10-28 DIAGNOSIS — L814 Other melanin hyperpigmentation: Secondary | ICD-10-CM | POA: Diagnosis not present

## 2019-10-28 DIAGNOSIS — C44622 Squamous cell carcinoma of skin of right upper limb, including shoulder: Secondary | ICD-10-CM | POA: Diagnosis not present

## 2019-10-28 DIAGNOSIS — L816 Other disorders of diminished melanin formation: Secondary | ICD-10-CM | POA: Diagnosis not present

## 2019-10-28 DIAGNOSIS — D485 Neoplasm of uncertain behavior of skin: Secondary | ICD-10-CM | POA: Diagnosis not present

## 2019-10-28 DIAGNOSIS — C44519 Basal cell carcinoma of skin of other part of trunk: Secondary | ICD-10-CM | POA: Diagnosis not present

## 2019-10-28 DIAGNOSIS — L578 Other skin changes due to chronic exposure to nonionizing radiation: Secondary | ICD-10-CM | POA: Diagnosis not present

## 2019-11-06 ENCOUNTER — Other Ambulatory Visit (INDEPENDENT_AMBULATORY_CARE_PROVIDER_SITE_OTHER): Payer: Medicare HMO

## 2019-11-06 ENCOUNTER — Other Ambulatory Visit: Payer: Self-pay

## 2019-11-06 DIAGNOSIS — D696 Thrombocytopenia, unspecified: Secondary | ICD-10-CM | POA: Diagnosis not present

## 2019-11-06 DIAGNOSIS — E7800 Pure hypercholesterolemia, unspecified: Secondary | ICD-10-CM | POA: Diagnosis not present

## 2019-11-06 DIAGNOSIS — E1149 Type 2 diabetes mellitus with other diabetic neurological complication: Secondary | ICD-10-CM | POA: Diagnosis not present

## 2019-11-06 LAB — BASIC METABOLIC PANEL
BUN: 18 mg/dL (ref 6–23)
CO2: 30 mEq/L (ref 19–32)
Calcium: 9.5 mg/dL (ref 8.4–10.5)
Chloride: 105 mEq/L (ref 96–112)
Creatinine, Ser: 0.77 mg/dL (ref 0.40–1.20)
GFR: 74.61 mL/min (ref >60.00)
Glucose, Bld: 132 mg/dL — ABNORMAL HIGH (ref 70–99)
Potassium: 3.7 mEq/L (ref 3.5–5.1)
Sodium: 141 mEq/L (ref 135–145)

## 2019-11-06 LAB — CBC WITH DIFFERENTIAL/PLATELET
Basophils Absolute: 0 10*3/uL (ref 0.0–0.1)
Basophils Relative: 0.7 % (ref 0.0–3.0)
Eosinophils Absolute: 0.4 10*3/uL (ref 0.0–0.7)
Eosinophils Relative: 8.9 % — ABNORMAL HIGH (ref 0.0–5.0)
HCT: 40.3 % (ref 36.0–46.0)
Hemoglobin: 13.5 g/dL (ref 12.0–15.0)
Lymphocytes Relative: 30 % (ref 12.0–46.0)
Lymphs Abs: 1.4 10*3/uL (ref 0.7–4.0)
MCHC: 33.4 g/dL (ref 30.0–36.0)
MCV: 90.2 fl (ref 78.0–100.0)
Monocytes Absolute: 0.3 10*3/uL (ref 0.1–1.0)
Monocytes Relative: 6.7 % (ref 3.0–12.0)
Neutro Abs: 2.5 10*3/uL (ref 1.4–7.7)
Neutrophils Relative %: 53.7 % (ref 43.0–77.0)
Platelets: 179 10*3/uL (ref 150.0–400.0)
RBC: 4.47 Mil/uL (ref 3.87–5.11)
RDW: 13.5 % (ref 11.5–15.5)
WBC: 4.6 10*3/uL (ref 4.0–10.5)

## 2019-11-06 LAB — LIPID PANEL
Cholesterol: 174 mg/dL (ref 0–200)
HDL: 61.1 mg/dL (ref >39.00)
LDL Cholesterol: 101 mg/dL — ABNORMAL HIGH (ref 0–99)
NonHDL: 112.67
Total CHOL/HDL Ratio: 3
Triglycerides: 60 mg/dL (ref 0.0–149.0)
VLDL: 12 mg/dL (ref 0.0–40.0)

## 2019-11-06 LAB — HEMOGLOBIN A1C: Hgb A1c MFr Bld: 6.3 % (ref 4.6–6.5)

## 2019-11-06 LAB — HEPATIC FUNCTION PANEL
ALT: 13 U/L (ref 0–35)
AST: 14 U/L (ref 0–37)
Albumin: 4.3 g/dL (ref 3.5–5.2)
Alkaline Phosphatase: 56 U/L (ref 39–117)
Bilirubin, Direct: 0.2 mg/dL (ref 0.0–0.3)
Total Bilirubin: 0.9 mg/dL (ref 0.2–1.2)
Total Protein: 6.4 g/dL (ref 6.0–8.3)

## 2019-11-06 NOTE — Addendum Note (Signed)
Addended by: Leeanne Rio on: 11/06/2019 08:10 AM   Modules accepted: Orders

## 2019-11-07 DIAGNOSIS — Z170 Estrogen receptor positive status [ER+]: Secondary | ICD-10-CM | POA: Diagnosis not present

## 2019-11-07 DIAGNOSIS — N644 Mastodynia: Secondary | ICD-10-CM | POA: Diagnosis not present

## 2019-11-07 DIAGNOSIS — D0512 Intraductal carcinoma in situ of left breast: Secondary | ICD-10-CM | POA: Diagnosis not present

## 2019-11-07 DIAGNOSIS — C50912 Malignant neoplasm of unspecified site of left female breast: Secondary | ICD-10-CM | POA: Diagnosis not present

## 2019-11-07 DIAGNOSIS — Z853 Personal history of malignant neoplasm of breast: Secondary | ICD-10-CM | POA: Diagnosis not present

## 2019-11-09 ENCOUNTER — Encounter: Payer: Self-pay | Admitting: Internal Medicine

## 2019-11-10 ENCOUNTER — Ambulatory Visit (INDEPENDENT_AMBULATORY_CARE_PROVIDER_SITE_OTHER): Payer: Medicare HMO | Admitting: Internal Medicine

## 2019-11-10 ENCOUNTER — Other Ambulatory Visit: Payer: Self-pay

## 2019-11-10 VITALS — BP 130/80 | Ht 65.0 in | Wt 147.0 lb

## 2019-11-10 DIAGNOSIS — J1289 Other viral pneumonia: Secondary | ICD-10-CM

## 2019-11-10 DIAGNOSIS — D7219 Other eosinophilia: Secondary | ICD-10-CM | POA: Diagnosis not present

## 2019-11-10 DIAGNOSIS — R945 Abnormal results of liver function studies: Secondary | ICD-10-CM

## 2019-11-10 DIAGNOSIS — M25511 Pain in right shoulder: Secondary | ICD-10-CM | POA: Diagnosis not present

## 2019-11-10 DIAGNOSIS — D508 Other iron deficiency anemias: Secondary | ICD-10-CM

## 2019-11-10 DIAGNOSIS — R7989 Other specified abnormal findings of blood chemistry: Secondary | ICD-10-CM

## 2019-11-10 DIAGNOSIS — E1149 Type 2 diabetes mellitus with other diabetic neurological complication: Secondary | ICD-10-CM

## 2019-11-10 DIAGNOSIS — I10 Essential (primary) hypertension: Secondary | ICD-10-CM

## 2019-11-10 DIAGNOSIS — U071 COVID-19: Secondary | ICD-10-CM

## 2019-11-10 DIAGNOSIS — E7800 Pure hypercholesterolemia, unspecified: Secondary | ICD-10-CM | POA: Diagnosis not present

## 2019-11-10 DIAGNOSIS — Z853 Personal history of malignant neoplasm of breast: Secondary | ICD-10-CM | POA: Diagnosis not present

## 2019-11-10 NOTE — Progress Notes (Signed)
Patient ID: Angel Floyd, female   DOB: 02-29-1951, 68 y.o.   MRN: [id number]   Virtual Visit via video Note  This visit type was conducted due to national recommendations for restrictions regarding the COVID-19 pandemic (e.g. social distancing).  This format is felt to be most appropriate for this patient at this time.  All issues noted in this document were discussed and addressed.  No physical exam was performed (except for noted visual exam findings with Video Visits).   I connected with Ashe Brigance by a video enabled telemedicine application and verified that I am speaking with the correct person using two identifiers. Location patient: home Location provider: work or home office Persons participating in the virtual visit: patient, provider  The limitations, risks, security and privacy concerns of performing an evaluation and management service by video and the availability of in person appointments have been discussed.  The patient expressed understanding and agreed to proceed.   Reason for visit: scheduled follow up.    HPI: Recent covid infection.  Overall doing well from this.  Recovered well.  Handling stress.  Staying active.  No chest pain or sob reported. Has seen Dr Harlow Mares for her right shoulder.  S/p cortisone injection.  Catches.  Planning for MRI this pm.  Recently saw dermatology.  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   Has been watching diet.  Sugar improved - recent a1c 6.3.  Discussed labs.  No abdominal pain or bowel change reported.  Continues on aspirin and plavix.     ROS: See pertinent positives and negatives per HPI.  Past Medical History:  Diagnosis Date  . Anemia   . Anginal pain ([place])   . Breast cancer ([place])   . Diabetes mellitus ([place])   . History of abnormal Pap smear    class III, required cryosurgery  . Hypercholesterolemia   . Hypertension   . TIA (transient ischemic attack)     Past Surgical History:  Procedure Laterality Date    . BREAST CYST ASPIRATION  1989  . COLONOSCOPY WITH PROPOFOL N/A 11/05/2017   Procedure: COLONOSCOPY WITH PROPOFOL;  Surgeon: Lollie Sails, MD;  Location: [facility] ENDOSCOPY;  Service: Endoscopy;  Laterality: N/A;  . DILATION AND CURETTAGE OF UTERUS    . ESOPHAGOGASTRODUODENOSCOPY (EGD) WITH PROPOFOL N/A 11/05/2017   Procedure: ESOPHAGOGASTRODUODENOSCOPY (EGD) WITH PROPOFOL;  Surgeon: Lollie Sails, MD;  Location: [facility] ENDOSCOPY;  Service: Endoscopy;  Laterality: N/A;  . FOOT SURGERY     morton's neuroma removed - right foot  . FOOT SURGERY  10/2009   achilles tendon release with reconstructive surgery  . GYNECOLOGIC CRYOSURGERY     class III pap  . HAND SURGERY     cyst removed - left hand  . hysteroscopy and D&C  7/09  . KNEE ARTHROSCOPY Left [id number]   removed tear & resurfaced area  . KNEE SURGERY  09/20/12   torn meniscus, (Dr Leanor Kail)  . TUBAL LIGATION      Family History  Problem Relation Age of Onset  . Lung cancer Father   . Alcohol abuse Father   . Cancer Father        lung  . CVA Mother   . Colon cancer Mother   . Alcohol abuse Mother   . Cancer Mother        colon  . Stroke Mother   . Colon cancer Sister   . Diabetes Maternal Grandfather     SOCIAL HX: reviewed.  Current Outpatient Medications:  .  aspirin 81 MG tablet, Take 81 mg by mouth daily., Disp: , Rfl:  .  clopidogrel (PLAVIX) 75 MG tablet, Take 1 tablet (75 mg total) by mouth daily., Disp: 90 tablet, Rfl: 1 .  Fe Fum-FePoly-Vit C-Vit B3 (INTEGRA) 62.5-62.5-40-3 MG CAPS, Take 1 capsule by mouth daily., Disp: 90 capsule, Rfl: 1 .  fish oil-omega-3 fatty acids 1000 MG capsule, Take 2 g by mouth daily. , Disp: , Rfl:  .  fluticasone furoate-vilanterol (BREO ELLIPTA) 200-25 MCG/INH AEPB, Inhale 1 puff into the lungs daily. (Patient taking differently: Inhale 1 puff into the lungs daily as needed. ), Disp: 60 each, Rfl: 5 .  glucose blood test strip, Use as instructed to check blood  sugars three times daily. Has One Touch Ultra glucometer. Dx 250.00, Disp: 100 each, Rfl: 2 .  metFORMIN (GLUCOPHAGE) 500 MG tablet, TAKE 1 TABLET DAILY (Patient taking differently: Take 500 mg by mouth daily with breakfast. ), Disp: 90 tablet, Rfl: 1 .  pantoprazole (PROTONIX) 40 MG tablet, Take 1 tablet (40 mg total) by mouth daily., Disp: 90 tablet, Rfl: 1 .  rosuvastatin (CRESTOR) 10 MG tablet, Take 1 tablet (10 mg total) by mouth daily., Disp: 90 tablet, Rfl: 1 .  telmisartan (MICARDIS) 20 MG tablet, TAKE 1 TABLET DAILY (Patient taking differently: Take 20 mg by mouth daily. ), Disp: 90 tablet, Rfl: 2  EXAM:  VITALS per patient if applicable: 366 lbs.   GENERAL: alert, oriented, appears well and in no acute distress  HEENT: atraumatic, conjunttiva clear, no obvious abnormalities on inspection of external nose and ears  NECK: normal movements of the head and neck  LUNGS: on inspection no signs of respiratory distress, breathing rate appears normal, no obvious gross SOB, gasping or wheezing  CV: no obvious cyanosis  PSYCH/NEURO: pleasant and cooperative, no obvious depression or anxiety, speech and thought processing grossly intact  ASSESSMENT AND PLAN:  Discussed the following assessment and plan:  Abnormal liver function test Elevated recently - felt to be related to covid.  F/u liver panel wnl.    Anemia Has a history of anemia.  Recent hgb wnl.    History of breast cancer Bilateral diagnostic mammogram and left breast ultrasound 11/07/19 - Birads II.  Recommended screening mammogram in one year.    Hypercholesterolemia On crestor.  Low cholesterol diet and exercise.  Follow lipid panel and liver function tests.    Hypertension Blood pressure under good control.  Continue same medication regimen.  Follow pressures.  Follow metabolic panel.    Pneumonia due to COVID-19 virus Recent covid infection.  F/u cxr revealed clearance if infiltrates.    Type 2 diabetes  mellitus with neurological complications (HCC) Sugars improved.  Low carb diet and exercise.  Follow met b and a1c.    Right shoulder pain Not doing as much lifting now.  S/p injection.  Persistent issues - catches, etc.  Planning for MRI this pm.  Continue f/u with ortho.      I discussed the assessment and treatment plan with the patient. The patient was provided an opportunity to ask questions and all were answered. The patient agreed with the plan and demonstrated an understanding of the instructions.   The patient was advised to call back or seek an in-person evaluation if the symptoms worsen or if the condition fails to improve as anticipated.   Einar Pheasant, MD

## 2019-11-13 DIAGNOSIS — M75111 Incomplete rotator cuff tear or rupture of right shoulder, not specified as traumatic: Secondary | ICD-10-CM | POA: Diagnosis not present

## 2019-11-16 ENCOUNTER — Encounter: Payer: Self-pay | Admitting: Internal Medicine

## 2019-11-16 DIAGNOSIS — M25511 Pain in right shoulder: Secondary | ICD-10-CM | POA: Insufficient documentation

## 2019-11-16 NOTE — Assessment & Plan Note (Signed)
Blood pressure under good control.  Continue same medication regimen.  Follow pressures.  Follow metabolic panel.   

## 2019-11-16 NOTE — Assessment & Plan Note (Signed)
Elevated recently - felt to be related to covid.  F/u liver panel wnl.

## 2019-11-16 NOTE — Assessment & Plan Note (Signed)
Recent covid infection.  F/u cxr revealed clearance if infiltrates.

## 2019-11-16 NOTE — Assessment & Plan Note (Signed)
Not doing as much lifting now.  S/p injection.  Persistent issues - catches, etc.  Planning for MRI this pm.  Continue f/u with ortho.

## 2019-11-16 NOTE — Assessment & Plan Note (Signed)
Sugars improved.  Low carb diet and exercise.  Follow met b and a1c.

## 2019-11-16 NOTE — Assessment & Plan Note (Signed)
On crestor.  Low cholesterol diet and exercise.  Follow lipid panel and liver function tests.   

## 2019-11-16 NOTE — Assessment & Plan Note (Signed)
Bilateral diagnostic mammogram and left breast ultrasound 11/07/19 - Birads II.  Recommended screening mammogram in one year.

## 2019-11-16 NOTE — Assessment & Plan Note (Signed)
Has a history of anemia.  Recent hgb wnl.

## 2019-11-18 ENCOUNTER — Encounter: Payer: Self-pay | Admitting: Internal Medicine

## 2019-11-24 NOTE — Telephone Encounter (Signed)
Pt is needing a call back about her Surgery on 1/11 about medication

## 2019-11-24 NOTE — Telephone Encounter (Signed)
Patient needing clearance to stop her blood thinners for her procedure on 1/11. Stop 3 days prior and resume 3 days post surgery. Just wanted to make sure you had seen this message since we have both been out of the office.

## 2019-11-24 NOTE — Telephone Encounter (Signed)
I called pt and spoke to her about being off aspirin and plavix.  Discussed risk of stopping the medication.  She is aware.  Agrees to stopping 3 days prior to procedure, but would like to restart asap after the procedure.  Please contact Sharyn Lull at [facility] and inform of above.  Also, if there is a time I could talk with her and discuss.  Thanks

## 2019-11-26 ENCOUNTER — Other Ambulatory Visit: Payer: Self-pay | Admitting: Internal Medicine

## 2019-11-26 MED ORDER — CLOPIDOGREL BISULFATE 75 MG PO TABS: 75.0000 mg | ORAL_TABLET | Freq: Every day | ORAL | 3 refills | Status: DC

## 2019-11-26 MED ORDER — ROSUVASTATIN CALCIUM 10 MG PO TABS: 10.0000 mg | ORAL_TABLET | Freq: Every day | ORAL | 3 refills | Status: DC

## 2019-11-26 MED ORDER — METFORMIN HCL 500 MG PO TABS: 500.0000 mg | ORAL_TABLET | Freq: Every day | ORAL | 3 refills | Status: DC

## 2019-11-26 MED ORDER — TELMISARTAN 20 MG PO TABS: 20.0000 mg | ORAL_TABLET | Freq: Every day | ORAL | 3 refills | Status: DC

## 2019-11-26 MED ORDER — PANTOPRAZOLE SODIUM 40 MG PO TBEC: 40.0000 mg | DELAYED_RELEASE_TABLET | Freq: Every day | ORAL | 1 refills | Status: DC

## 2019-11-26 NOTE — Telephone Encounter (Signed)
Spoke with patient. Called surgery center and left message to speak with Sharyn Lull about procedure. Sharyn Lull called back and left number for Dr Nicki Reaper to reach her directly. Message given to Dr Nicki Reaper. Routing to PCP for documentation. Will notify patient once plan is discussed.

## 2019-11-26 NOTE — Telephone Encounter (Signed)
Called and spoke to [facility] (Skin Surgery).  Discussed stopping plavix and aspirin.  Informed her pt agreed to stop 3 days prior to procedure, but had questions about resuming the day after procedure instead of waiting for three days to restart.  After discussion, it was decided (given procedure) - ok to continue aspirin and plavix.  Confirm she did not need to stop aspirin or plavix.  Ms Mcdermid notified.  Questions answered.  rx sent in for medication to [facility]

## 2019-12-01 DIAGNOSIS — C44519 Basal cell carcinoma of skin of other part of trunk: Secondary | ICD-10-CM | POA: Diagnosis not present

## 2019-12-01 DIAGNOSIS — C44622 Squamous cell carcinoma of skin of right upper limb, including shoulder: Secondary | ICD-10-CM | POA: Diagnosis not present

## 2019-12-01 DIAGNOSIS — L905 Scar conditions and fibrosis of skin: Secondary | ICD-10-CM | POA: Diagnosis not present

## 2020-01-07 ENCOUNTER — Other Ambulatory Visit: Payer: Self-pay | Admitting: Orthopedic Surgery

## 2020-01-09 ENCOUNTER — Telehealth: Payer: Self-pay | Admitting: Internal Medicine

## 2020-01-09 ENCOUNTER — Other Ambulatory Visit: Payer: Self-pay | Admitting: Orthopedic Surgery

## 2020-01-09 ENCOUNTER — Encounter: Payer: Self-pay | Admitting: Internal Medicine

## 2020-01-09 ENCOUNTER — Other Ambulatory Visit (INDEPENDENT_AMBULATORY_CARE_PROVIDER_SITE_OTHER): Payer: Medicare HMO

## 2020-01-09 ENCOUNTER — Other Ambulatory Visit: Payer: Self-pay

## 2020-01-09 ENCOUNTER — Telehealth: Payer: Self-pay

## 2020-01-09 ENCOUNTER — Ambulatory Visit (INDEPENDENT_AMBULATORY_CARE_PROVIDER_SITE_OTHER): Payer: Medicare HMO | Admitting: Internal Medicine

## 2020-01-09 DIAGNOSIS — R300 Dysuria: Secondary | ICD-10-CM | POA: Diagnosis not present

## 2020-01-09 DIAGNOSIS — N3001 Acute cystitis with hematuria: Secondary | ICD-10-CM | POA: Diagnosis not present

## 2020-01-09 DIAGNOSIS — E1149 Type 2 diabetes mellitus with other diabetic neurological complication: Secondary | ICD-10-CM | POA: Diagnosis not present

## 2020-01-09 LAB — POCT URINALYSIS DIPSTICK
Bilirubin, UA: NEGATIVE
Glucose, UA: POSITIVE — AB
Ketones, UA: NEGATIVE
Nitrite, UA: POSITIVE
Protein, UA: POSITIVE — AB
Spec Grav, UA: 1.01 (ref 1.010–1.025)
Urobilinogen, UA: 0.2 E.U./dL
pH, UA: 5 (ref 5.0–8.0)

## 2020-01-09 LAB — URINALYSIS, MICROSCOPIC ONLY

## 2020-01-09 MED ORDER — NITROFURANTOIN MONOHYD MACRO 100 MG PO CAPS: 100.0000 mg | ORAL_CAPSULE | Freq: Two times a day (BID) | ORAL | 0 refills | Status: DC

## 2020-01-09 NOTE — Telephone Encounter (Signed)
[place] [facility] [place] RECORD [facility]urse Patient Name: Angel Floyd Gender: Female DOB: Nov 30, 1950 Age: 69 Y 10 M 13 D Return Phone Number: [phone] (Primary), [phone] (Secondary) Address: City/State/Zip: [place] [address] Client [place] Client Site [place] - Day Physician Einar Pheasant - MD Contact Type Call Who Is Calling Patient / Member / Family / Caregiver Call Type Triage / Clinical Relationship To Patient Self Return Phone Number [phone] (Primary) Chief Complaint Urine, Blood In Reason for Call Symptomatic / Request for [place] states that her urine is cloudy and it has blood in it. She thinks that she has a UTI. Translation No Nurse Assessment Nurse: Alinda Money, RN, Sarah Date/Time [name] Time): 01/09/2020 9:07:27 AM Confirm and document reason for call. If symptomatic, describe symptoms. ---Caller states she started having cloudy urine yesterday and this morning has some blood in the urine. Also having some pain. No fever. Has the patient had close contact with a person known or suspected to have the novel coronavirus illness OR traveled / lives in area with major community spread (including international travel) in the last 14 days from the onset of symptoms? * If Asymptomatic, screen for exposure and travel within the last 14 days. ---No Does the patient have any new or worsening symptoms? ---Yes Will a triage be completed? ---Yes Related visit to physician within the last 2 weeks? ---No Does the PT have any chronic conditions? (i.e. diabetes, asthma, this includes High risk factors for pregnancy, etc.) ---Yes List chronic conditions. ---HTN, high cholesterol, DM2, Cancer, Is this a behavioral health or substance abuse call? ---No Guidelines Guideline Title Affirmed Question Affirmed Notes Nurse  Date/Time (Eastern Time) Urination Pain - Female Diabetes mellitus or weak immune system (e.g., HIV positive, cancer chemo, splenectomy, organ transplant, chronic steroids) Goins, RN, Judson Roch 01/09/2020 9:09:11 AM PLEASE NOTE: All timestamps contained within this report are represented as [place] Standard Time. CONFIDENTIALTY NOTICE: This fax transmission is intended only for the addressee. It contains information that is legally privileged, confidential or otherwise protected from use or disclosure. If you are not the intended recipient, you are strictly prohibited from reviewing, disclosing, copying using or disseminating any of this information or taking any action in reliance on or regarding this information. If you have received this fax in error, please notify us immediately by telephone so that we can arrange for its return to [place]. Phone: [phone], Toll-Free: [phone], Fax: [phone] Page: 2 of 2 Call Id: [id number] [place]. Time [name] 01/09/2020 9:10:22 AM See HCP within 4 Hours (or PCP triage) Yes Goins, RN, [place] Disagree/Comply Comply Caller Understands Yes PreDisposition Call Doctor Care Advice Given Per Guideline SEE HCP WITHIN 4 HOURS (OR PCP TRIAGE): * IF OFFICE WILL BE OPEN: You need to be seen within the next 3 or 4 hours. Call your doctor (or NP/PA) now or as soon as the office opens. CALL BACK IF: * You become worse. CARE ADVICE given per Urination Pain - Female (Adult) guideline.[place] [facility] - [place] TELEPHONE ADVICE RECORD Access[facility]urse Patient Name: Angel Floyd Gender: Female DOB: 25-Nov-1950 Age: 69 Y 10 M 13 D Return Phone Number: [phone] (Primary), [phone] (Secondary) Address: City/State/Zip: [place] [address] Client [place] Client Site [place] Physician Einar Pheasant - MD Contact Type Call Who Is  Calling Patient /  Member / Family / Caregiver Call Type Triage / Clinical Relationship To Patient Self Return Phone Number [phone] (Primary) Chief Complaint Urine, Blood In Reason for Call Symptomatic / Request for Health Information Initial Comment Caller states that her urine is cloudy and it has blood in it. She thinks that she has a UTI. Translation No Nurse Assessment Nurse: Alinda Money, RN, Sarah Date/Time [name] Time): 01/09/2020 9:07:27 AM Confirm and document reason for call. If symptomatic, describe symptoms. ---Caller states she started having cloudy urine yesterday and this morning has some blood in the urine. Also having some pain. No fever. Has the patient had close contact with a person known or suspected to have the novel coronavirus illness OR traveled / lives in area with major community spread (including international travel) in the last 14 days from the onset of symptoms? * If Asymptomatic, screen for exposure and travel within the last 14 days. ---No Does the patient have any new or worsening symptoms? ---Yes Will a triage be completed? ---Yes Related visit to physician within the last 2 weeks? ---No Does the PT have any chronic conditions? (i.e. diabetes, asthma, this includes High risk factors for pregnancy, etc.) ---Yes List chronic conditions. ---HTN, high cholesterol, DM2, Cancer, Is this a behavioral health or substance abuse call? ---No Guidelines Guideline Title Affirmed Question Affirmed Notes Nurse Date/Time (Eastern Time) Urination Pain - Female Diabetes mellitus or weak immune system (e.g., HIV positive, cancer chemo, splenectomy, organ transplant, chronic steroids) Goins, RN, Judson Roch 01/09/2020 9:09:11 AM PLEASE NOTE: All timestamps contained within this report are represented as [place] Standard Time. CONFIDENTIALTY NOTICE: This fax transmission is intended only for the addressee. It contains information that is legally privileged,  confidential or otherwise protected from use or disclosure. If you are not the intended recipient, you are strictly prohibited from reviewing, disclosing, copying using or disseminating any of this information or taking any action in reliance on or regarding this information. If you have received this fax in error, please notify us immediately by telephone so that we can arrange for its return to [place]. Phone: [phone], Toll-Free: [phone], Fax: [phone] Page: 2 of 2 Call Id: [id number] [place]. Time [name] 01/09/2020 9:10:22 AM See HCP within 4 Hours (or PCP triage) Yes Goins, RN, [place] Disagree/Comply Comply Caller Understands Yes PreDisposition Call Doctor Care Advice Given Per Guideline SEE HCP WITHIN 4 HOURS (OR PCP TRIAGE): * IF OFFICE WILL BE OPEN: You need to be seen within the next 3 or 4 hours. Call your doctor (or NP/PA) now or as soon as the office opens. CALL BACK IF: * You become worse. CARE ADVICE given per Urination Pain - Female (Adult) guideline.[place] [facility] - [place] RECORD Access[facility]urse Patient Name: Angel Floyd Gender: Female DOB: 07/20/1951 Age: 26 Y 10 M 13 D Return Phone Number: [phone] (Primary), [phone] (Secondary) Address: City/State/Zip: [place] [address] Client [place] Client Site [place] - Day Physician Einar Pheasant - MD Contact Type Call Who Is Calling Patient / Member / Family / Caregiver Call Type Triage / Clinical Relationship To Patient Self Return Phone Number [phone] (Primary) Chief Complaint Urine, Blood In Reason for Call Symptomatic / Request for [place] states that her urine is cloudy and it has blood in it. She thinks that she has a UTI. Translation No Nurse Assessment Nurse: Alinda Money, RN, Sarah Date/Time [name] Time): 01/09/2020  9:07:27  AM Confirm and document reason for call. If symptomatic, describe symptoms. ---Caller states she started having cloudy urine yesterday and this morning has some blood in the urine. Also having some pain. No fever. Has the patient had close contact with a person known or suspected to have the novel coronavirus illness OR traveled / lives in area with major community spread (including international travel) in the last 14 days from the onset of symptoms? * If Asymptomatic, screen for exposure and travel within the last 14 days. ---No Does the patient have any new or worsening symptoms? ---Yes Will a triage be completed? ---Yes Related visit to physician within the last 2 weeks? ---No Does the PT have any chronic conditions? (i.e. diabetes, asthma, this includes High risk factors for pregnancy, etc.) ---Yes List chronic conditions. ---HTN, high cholesterol, DM2, Cancer, Is this a behavioral health or substance abuse call? ---No Guidelines Guideline Title Affirmed Question Affirmed Notes Nurse Date/Time (Eastern Time) Urination Pain - Female Diabetes mellitus or weak immune system (e.g., HIV positive, cancer chemo, splenectomy, organ transplant, chronic steroids) Goins, RN, Judson Roch 01/09/2020 9:09:11 AM PLEASE NOTE: All timestamps contained within this report are represented as [place] Standard Time. CONFIDENTIALTY NOTICE: This fax transmission is intended only for the addressee. It contains information that is legally privileged, confidential or otherwise protected from use or disclosure. If you are not the intended recipient, you are strictly prohibited from reviewing, disclosing, copying using or disseminating any of this information or taking any action in reliance on or regarding this information. If you have received this fax in error, please notify us immediately by telephone so that we can arrange for its return to [place]. Phone: [phone], Toll-Free: [phone], Fax:  [phone] Page: 2 of 2 Call Id: [id number] [place]. Time [name] 01/09/2020 9:10:22 AM See HCP within 4 Hours (or PCP triage) Yes Goins, RN, [place] Disagree/Comply Comply Caller Understands Yes PreDisposition Call Doctor Care Advice Given Per Guideline SEE HCP WITHIN 4 HOURS (OR PCP TRIAGE): * IF OFFICE WILL BE OPEN: You need to be seen within the next 3 or 4 hours. Call your doctor (or NP/PA) now or as soon as the office opens. CALL BACK IF: * You become worse. CARE ADVICE given per Urination Pain - Female (Adult) guideline.[place] [facility] - [place] RECORD AccessNurse Patient Name: Angel Floyd Gender: Female DOB: 09-16-1951 Age: 37 Y 10 M 13 D Return Phone Number: [phone] (Primary), [phone] (Secondary) Address: City/State/Zip: [place] [address] Client [place] Client Site [place] - Day Physician Einar Pheasant - MD Contact Type Call Who Is Calling Patient / Member / Family / Caregiver Call Type Triage / Clinical Relationship To Patient Self Return Phone Number [phone] (Primary) Chief Complaint Urine, Blood In Reason for Call Symptomatic / Request for [place] states that her urine is cloudy and it has blood in it. She thinks that she has a UTI. Translation No Nurse Assessment Nurse: Alinda Money, RN, Sarah Date/Time [name] Time): 01/09/2020 9:07:27 AM Confirm and document reason for call. If symptomatic, describe symptoms. ---Caller states she started having cloudy urine yesterday and this morning has some blood in the urine. Also having some pain. No fever. Has the patient had close contact with a person known or suspected to have the novel coronavirus illness OR traveled / lives in area with major community spread (including international travel) in the last 14 days  from the  onset of symptoms? * If Asymptomatic, screen for exposure and travel within the last 14 days. ---No Does the patient have any new or worsening symptoms? ---Yes Will a triage be completed? ---Yes Related visit to physician within the last 2 weeks? ---No Does the PT have any chronic conditions? (i.e. diabetes, asthma, this includes High risk factors for pregnancy, etc.) ---Yes List chronic conditions. ---HTN, high cholesterol, DM2, Cancer, Is this a behavioral health or substance abuse call? ---No Guidelines Guideline Title Affirmed Question Affirmed Notes Nurse Date/Time (Eastern Time) Urination Pain - Female Diabetes mellitus or weak immune system (e.g., HIV positive, cancer chemo, splenectomy, organ transplant, chronic steroids) Goins, RN, Judson Roch 01/09/2020 9:09:11 AM PLEASE NOTE: All timestamps contained within this report are represented as [place] Standard Time. CONFIDENTIALTY NOTICE: This fax transmission is intended only for the addressee. It contains information that is legally privileged, confidential or otherwise protected from use or disclosure. If you are not the intended recipient, you are strictly prohibited from reviewing, disclosing, copying using or disseminating any of this information or taking any action in reliance on or regarding this information. If you have received this fax in error, please notify us immediately by telephone so that we can arrange for its return to [place]. Phone: [phone], Toll-Free: [phone], Fax: [phone] Page: 2 of 2 Call Id: [id number] [place]. Time [name] 01/09/2020 9:10:22 AM See HCP within 4 Hours (or PCP triage) Yes Goins, RN, [place] Disagree/Comply Comply Caller Understands Yes PreDisposition Call Doctor Care Advice Given Per Guideline SEE HCP WITHIN 4 HOURS (OR PCP TRIAGE): * IF OFFICE WILL BE OPEN: You need to be seen within the next 3 or 4 hours. Call your doctor (or NP/PA) now or as soon as  the office opens. CALL BACK IF: * You become worse. CARE ADVICE given per Urination Pain - Female (Adult) guideline.

## 2020-01-09 NOTE — Telephone Encounter (Signed)
Pt spoke with access nurse and they wanted her to give a urine sample today due to blood in urine. Please put order in. Scheduled pt at 11:15.

## 2020-01-09 NOTE — Telephone Encounter (Signed)
Patient ha slab appointment scheduled and LPN adding labs.

## 2020-01-09 NOTE — Telephone Encounter (Signed)
See other note

## 2020-01-09 NOTE — Telephone Encounter (Signed)
Called patient to confirm ok. She did notice some blood this morning in her urine but not a large amount. She denies having any fever, chills, nausea, vomiting, abdominal pain, etc. I have scheduled her for a virtual visit with PCP this afternoon

## 2020-01-09 NOTE — Progress Notes (Signed)
Patient ID: Angel Floyd, female   DOB: August 17, 1951, 69 y.o.   MRN: [id number]   Virtual Visit via video Note  This visit type was conducted due to national recommendations for restrictions regarding the COVID-19 pandemic (e.g. social distancing).  This format is felt to be most appropriate for this patient at this time.  All issues noted in this document were discussed and addressed.  No physical exam was performed (except for noted visual exam findings with Video Visits).   I connected with Fransisca Brousseau by a video enabled telemedicine application and verified that I am speaking with the correct person using two identifiers. Location patient: home Location provider: work Persons participating in the virtual visit: patient, provider  The limitations, risks, security and privacy concerns of performing an evaluation and management service by video and the availability of in person appointments have been discussed.  The patient expressed understanding and agreed to proceed.   Reason for visit: work in appt  HPI: Work in appt for possible uti.  States symptoms started 01/08/20.  Noticed cloudy urine.  Some blood in urine.  No increased back or abdominal pain.  Developed some increased urgency and dysuria.  Eating.  No nausea or vomiting.  No fever or chills.  Due to receive second covid vaccine 01/18/20.     ROS: See pertinent positives and negatives per HPI.  Past Medical History:  Diagnosis Date  . Anemia   . Anginal pain ([place])   . Breast cancer ([place])   . Diabetes mellitus ([place])   . History of abnormal Pap smear    class III, required cryosurgery  . Hypercholesterolemia   . Hypertension   . TIA (transient ischemic attack)     Past Surgical History:  Procedure Laterality Date  . BREAST CYST ASPIRATION  1989  . COLONOSCOPY WITH PROPOFOL N/A 11/05/2017   Procedure: COLONOSCOPY WITH PROPOFOL;  Surgeon: Lollie Sails, MD;  Location: [facility] ENDOSCOPY;  Service: Endoscopy;   Laterality: N/A;  . DILATION AND CURETTAGE OF UTERUS    . ESOPHAGOGASTRODUODENOSCOPY (EGD) WITH PROPOFOL N/A 11/05/2017   Procedure: ESOPHAGOGASTRODUODENOSCOPY (EGD) WITH PROPOFOL;  Surgeon: Lollie Sails, MD;  Location: [facility] ENDOSCOPY;  Service: Endoscopy;  Laterality: N/A;  . FOOT SURGERY     morton's neuroma removed - right foot  . FOOT SURGERY  10/2009   achilles tendon release with reconstructive surgery  . GYNECOLOGIC CRYOSURGERY     class III pap  . HAND SURGERY     cyst removed - left hand  . hysteroscopy and D&C  7/09  . KNEE ARTHROSCOPY Left [id number]   removed tear & resurfaced area  . KNEE SURGERY  09/20/12   torn meniscus, (Dr Leanor Kail)  . TUBAL LIGATION      Family History  Problem Relation Age of Onset  . Lung cancer Father   . Alcohol abuse Father   . Cancer Father        lung  . CVA Mother   . Colon cancer Mother   . Alcohol abuse Mother   . Cancer Mother        colon  . Stroke Mother   . Colon cancer Sister   . Diabetes Maternal Grandfather     SOCIAL HX: reviewed.    Current Outpatient Medications:  .  aspirin 81 MG tablet, Take 81 mg by mouth daily., Disp: , Rfl:  .  clopidogrel (PLAVIX) 75 MG tablet, Take 1 tablet (75 mg total) by mouth daily., Disp: 90 tablet,  Rfl: 3 .  Fe Fum-FePoly-Vit C-Vit B3 (INTEGRA) 62.5-62.5-40-3 MG CAPS, Take 1 capsule by mouth daily., Disp: 90 capsule, Rfl: 1 .  fish oil-omega-3 fatty acids 1000 MG capsule, Take 2 g by mouth daily. , Disp: , Rfl:  .  fluticasone furoate-vilanterol (BREO ELLIPTA) 200-25 MCG/INH AEPB, Inhale 1 puff into the lungs daily. (Patient taking differently: Inhale 1 puff into the lungs daily as needed. ), Disp: 60 each, Rfl: 5 .  glucose blood test strip, Use as instructed to check blood sugars three times daily. Has One Touch Ultra glucometer. Dx 250.00, Disp: 100 each, Rfl: 2 .  metFORMIN (GLUCOPHAGE) 500 MG tablet, TAKE 1 TABLET DAILY, Disp: 90 tablet, Rfl: 1 .  nitrofurantoin,  macrocrystal-monohydrate, (MACROBID) 100 MG capsule, Take 1 capsule (100 mg total) by mouth 2 (two) times daily., Disp: 10 capsule, Rfl: 0 .  pantoprazole (PROTONIX) 40 MG tablet, TAKE 1 TABLET DAILY, Disp: 90 tablet, Rfl: 1 .  rosuvastatin (CRESTOR) 10 MG tablet, Take 1 tablet (10 mg total) by mouth daily., Disp: 90 tablet, Rfl: 3 .  telmisartan (MICARDIS) 20 MG tablet, Take 1 tablet (20 mg total) by mouth daily., Disp: 90 tablet, Rfl: 3  EXAM:  GENERAL: alert, oriented, appears well and in no acute distress  HEENT: atraumatic, conjunttiva clear, no obvious abnormalities on inspection of external nose and ears  NECK: normal movements of the head and neck  LUNGS: on inspection no signs of respiratory distress, breathing rate appears normal, no obvious gross SOB, gasping or wheezing  CV: no obvious cyanosis  PSYCH/NEURO: pleasant and cooperative, no obvious depression or anxiety, speech and thought processing grossly intact  ASSESSMENT AND PLAN:  Discussed the following assessment and plan:  UTI (urinary tract infection) Recent notice of cloudy urine, dysuria and hematuria.  Urine dip c/w uti.  Given symptoms and urine dip, treat with macrobid.  Culture sent.  Stay hydrated.  Follow symptoms. Will notify once culture results available.  Will need f/u urine to confirm blood clears.    Type 2 diabetes mellitus with neurological complications (HCC) Sugars have been under good control.  On metformin. Follow.    Meds ordered this encounter  Medications  . nitrofurantoin, macrocrystal-monohydrate, (MACROBID) 100 MG capsule    Sig: Take 1 capsule (100 mg total) by mouth 2 (two) times daily.    Dispense:  10 capsule    Refill:  0     I discussed the assessment and treatment plan with the patient. The patient was provided an opportunity to ask questions and all were answered. The patient agreed with the plan and demonstrated an understanding of the instructions.   The patient was  advised to call back or seek an in-person evaluation if the symptoms worsen or if the condition fails to improve as anticipated.    Einar Pheasant, MD

## 2020-01-11 LAB — URINE CULTURE
MICRO NUMBER:: 10167610
SPECIMEN QUALITY:: ADEQUATE

## 2020-01-12 ENCOUNTER — Other Ambulatory Visit: Payer: Self-pay | Admitting: Internal Medicine

## 2020-01-12 DIAGNOSIS — R319 Hematuria, unspecified: Secondary | ICD-10-CM

## 2020-01-12 NOTE — Progress Notes (Signed)
Order placed for f/u urinalysis 

## 2020-01-15 ENCOUNTER — Other Ambulatory Visit: Payer: Medicare HMO

## 2020-01-18 ENCOUNTER — Encounter: Payer: Self-pay | Admitting: Internal Medicine

## 2020-01-18 DIAGNOSIS — N390 Urinary tract infection, site not specified: Secondary | ICD-10-CM | POA: Insufficient documentation

## 2020-01-18 NOTE — Assessment & Plan Note (Signed)
Recent notice of cloudy urine, dysuria and hematuria.  Urine dip c/w uti.  Given symptoms and urine dip, treat with macrobid.  Culture sent.  Stay hydrated.  Follow symptoms. Will notify once culture results available.  Will need f/u urine to confirm blood clears.

## 2020-01-18 NOTE — Assessment & Plan Note (Addendum)
Sugars have been under good control.  On metformin. Follow.

## 2020-01-19 ENCOUNTER — Other Ambulatory Visit: Payer: Self-pay

## 2020-01-19 ENCOUNTER — Encounter: Payer: Self-pay | Admitting: Internal Medicine

## 2020-01-19 ENCOUNTER — Encounter: Payer: Self-pay | Admitting: Orthopedic Surgery

## 2020-01-20 ENCOUNTER — Encounter: Payer: Self-pay | Admitting: Orthopedic Surgery

## 2020-01-20 NOTE — Telephone Encounter (Signed)
Noted.  Do I need to do anything more with this or does pt need anything - just let me know if so.

## 2020-01-21 ENCOUNTER — Encounter: Payer: Self-pay | Admitting: Orthopedic Surgery

## 2020-01-21 NOTE — H&P (Signed)
NAME: Angel Floyd MRN:   [id number] DOB:   04/27/1951     HISTORY AND PHYSICAL  CHIEF COMPLAINT:  Right shoulder pain  HISTORY:   Angel E Bullisis a 69 y.o. female  with right  Shoulder Pain Patient complains of right shoulder pain. The symptoms began several months ago. Aggravating factors: repetitive activity. Pain is located around the acromioclavicular [facility]) joint. Discomfort is described as aching, burning, sharp/stabbing and throbbing. Symptoms are exacerbated by repetitive movements, overhead movements and lying on the shoulder. Evaluation to date: plain films: abnormal ac oa and MRI: abnormal rotator cuff tear. Therapy to date includes: rest, ice, avoidance of offending activity, OTC analgesics which are somewhat effective, prescription NSAIDS which are somewhat effective, opioids which are somewhat effective, home exercises which are somewhat effective, physical therapy which was somewhat effective and corticosteroid injection which was somewhat effective.   PAST MEDICAL HISTORY:   Past Medical History:  Diagnosis Date  . Anemia   . Breast cancer ([place])   . COVID-19 08/05/2019  . Diabetes mellitus ([place])   . Dyspnea    better since recovery from COVID  . GERD (gastroesophageal reflux disease)   . Heart murmur   . History of abnormal Pap smear    class III, required cryosurgery  . Hypercholesterolemia   . Hypertension   . TIA (transient ischemic attack)    2008, 2018. no deficit    PAST SURGICAL HISTORY:   Past Surgical History:  Procedure Laterality Date  . BREAST CYST ASPIRATION  1989  . COLONOSCOPY WITH PROPOFOL N/A 11/05/2017   Procedure: COLONOSCOPY WITH PROPOFOL;  Surgeon: Lollie Sails, MD;  Location: [facility] ENDOSCOPY;  Service: Endoscopy;  Laterality: N/A;  . DILATION AND CURETTAGE OF UTERUS    . ESOPHAGOGASTRODUODENOSCOPY (EGD) WITH PROPOFOL N/A 11/05/2017   Procedure: ESOPHAGOGASTRODUODENOSCOPY (EGD) WITH PROPOFOL;  Surgeon: Lollie Sails, MD;   Location: [facility] ENDOSCOPY;  Service: Endoscopy;  Laterality: N/A;  . FOOT SURGERY     morton's neuroma removed - right foot  . FOOT SURGERY  10/2009   achilles tendon release with reconstructive surgery  . GYNECOLOGIC CRYOSURGERY     class III pap  . HAND SURGERY     cyst removed - left hand  . hysteroscopy and D&C  7/09  . KNEE ARTHROSCOPY Left [id number]   removed tear & resurfaced area  . KNEE SURGERY  09/20/12   torn meniscus, (Dr Leanor Kail)  . TUBAL LIGATION      MEDICATIONS:  (Not in a hospital admission)   ALLERGIES:   Allergies  Allergen Reactions  . Aggrenox [Aspirin-Dipyridamole Er]     Photosensitivity, "felt awful"    REVIEW OF SYSTEMS:   General ROS: negative  FAMILY HISTORY:   Family History  Problem Relation Age of Onset  . Lung cancer Father   . Alcohol abuse Father   . Cancer Father        lung  . CVA Mother   . Colon cancer Mother   . Alcohol abuse Mother   . Cancer Mother        colon  . Stroke Mother   . Colon cancer Sister   . Diabetes Maternal Grandfather     SOCIAL HISTORY:   reports that she has never smoked. She has never used smokeless tobacco. She reports that she does not drink alcohol or use drugs.  PHYSICAL EXAM:  General appearance: alert, cooperative and no distress Head: Normocephalic, without obvious abnormality, atraumatic Cardio: regular rate and rhythm,  S1, S2 normal, no murmur, click, rub or gallop Extremities: extremities normal, atraumatic, no cyanosis or edema and Homans sign is negative, no sign of DVT Pulses: 2+ and symmetric Skin: Skin color, texture, turgor normal. No rashes or lesions Lymph nodes: Cervical, supraclavicular, and axillary nodes normal. and none Neurologic: Alert and oriented X 3, normal strength and tone. Normal symmetric reflexes. Normal coordination and gait    LABORATORY STUDIES: No results for input(s): WBC, HGB, HCT, PLT in the last 72 hours.  No results for input(s): NA, K, CL, CO2,  GLUCOSE, BUN, CREATININE, CALCIUM in the last 72 hours.  STUDIES/RESULTS:  No results found.  ASSESSMENT:  End stage osteoarthritis right shoulder rotator cuff tear        Active Problems:   * No active hospital problems. *    PLAN:  Right shoulder arthroscopy with rotator cuff tear   Carlynn Spry PA-C 01/21/2020. 1:26 PM

## 2020-01-22 ENCOUNTER — Other Ambulatory Visit
Admission: RE | Admit: 2020-01-22 | Discharge: 2020-01-22 | Disposition: A | Discharge status: Home / Self Care | Payer: Medicare HMO | Source: Ambulatory Visit | Attending: Orthopedic Surgery | Admitting: Orthopedic Surgery

## 2020-01-22 ENCOUNTER — Other Ambulatory Visit: Payer: Self-pay

## 2020-01-22 DIAGNOSIS — Z20822 Contact with and (suspected) exposure to covid-19: Secondary | ICD-10-CM | POA: Insufficient documentation

## 2020-01-22 DIAGNOSIS — Z01812 Encounter for preprocedural laboratory examination: Secondary | ICD-10-CM | POA: Insufficient documentation

## 2020-01-22 LAB — SARS CORONAVIRUS 2 (TAT 6-24 HRS): SARS Coronavirus 2: NEGATIVE

## 2020-01-26 ENCOUNTER — Encounter: Payer: Self-pay | Admitting: Orthopedic Surgery

## 2020-01-26 ENCOUNTER — Ambulatory Visit: Payer: Medicare HMO | Admitting: Anesthesiology

## 2020-01-26 ENCOUNTER — Encounter
Admission: RE | Disposition: A | Discharge status: Home / Self Care | Payer: Self-pay | Source: Home / Self Care | Attending: Orthopedic Surgery

## 2020-01-26 ENCOUNTER — Ambulatory Visit
Admission: RE | Admit: 2020-01-26 | Discharge: 2020-01-26 | Disposition: A | Discharge status: Home / Self Care | Payer: Medicare HMO | Attending: Orthopedic Surgery | Admitting: Orthopedic Surgery

## 2020-01-26 ENCOUNTER — Other Ambulatory Visit: Payer: Self-pay

## 2020-01-26 DIAGNOSIS — Z8673 Personal history of transient ischemic attack (TIA), and cerebral infarction without residual deficits: Secondary | ICD-10-CM | POA: Insufficient documentation

## 2020-01-26 DIAGNOSIS — M75121 Complete rotator cuff tear or rupture of right shoulder, not specified as traumatic: Secondary | ICD-10-CM | POA: Insufficient documentation

## 2020-01-26 DIAGNOSIS — X58XXXA Exposure to other specified factors, initial encounter: Secondary | ICD-10-CM | POA: Diagnosis not present

## 2020-01-26 DIAGNOSIS — E119 Type 2 diabetes mellitus without complications: Secondary | ICD-10-CM | POA: Insufficient documentation

## 2020-01-26 DIAGNOSIS — Z7982 Long term (current) use of aspirin: Secondary | ICD-10-CM | POA: Diagnosis not present

## 2020-01-26 DIAGNOSIS — K219 Gastro-esophageal reflux disease without esophagitis: Secondary | ICD-10-CM | POA: Diagnosis not present

## 2020-01-26 DIAGNOSIS — Z7902 Long term (current) use of antithrombotics/antiplatelets: Secondary | ICD-10-CM | POA: Diagnosis not present

## 2020-01-26 DIAGNOSIS — Z7984 Long term (current) use of oral hypoglycemic drugs: Secondary | ICD-10-CM | POA: Insufficient documentation

## 2020-01-26 DIAGNOSIS — S46111A Strain of muscle, fascia and tendon of long head of biceps, right arm, initial encounter: Secondary | ICD-10-CM | POA: Insufficient documentation

## 2020-01-26 DIAGNOSIS — I10 Essential (primary) hypertension: Secondary | ICD-10-CM | POA: Diagnosis not present

## 2020-01-26 DIAGNOSIS — Z79899 Other long term (current) drug therapy: Secondary | ICD-10-CM | POA: Diagnosis not present

## 2020-01-26 HISTORY — DX: Dyspnea, unspecified: R06.00

## 2020-01-26 HISTORY — DX: Cardiac murmur, unspecified: R01.1

## 2020-01-26 HISTORY — DX: Gastro-esophageal reflux disease without esophagitis: K21.9

## 2020-01-26 HISTORY — PX: SHOULDER ARTHROSCOPY WITH ROTATOR CUFF REPAIR: SHX5685

## 2020-01-26 LAB — GLUCOSE, CAPILLARY
Glucose-Capillary: 97 mg/dL (ref 70–99)
Glucose-Capillary: 109 mg/dL — ABNORMAL HIGH (ref 70–99)

## 2020-01-26 SURGERY — ARTHROSCOPY, SHOULDER, WITH ROTATOR CUFF REPAIR
Anesthesia: Regional | Site: Shoulder | Laterality: Right

## 2020-01-26 MED ORDER — ACETAMINOPHEN 325 MG PO TABS: 325.0000 mg | ORAL_TABLET | ORAL | Status: DC | PRN

## 2020-01-26 MED ORDER — FENTANYL CITRATE (PF) 100 MCG/2ML IJ SOLN
INTRAMUSCULAR | Status: DC | PRN
  Administered 2020-01-26: 100 ug via INTRAVENOUS
  Administered 2020-01-26 (×2): 25 ug via INTRAVENOUS

## 2020-01-26 MED ORDER — LIDOCAINE HCL (CARDIAC) PF 100 MG/5ML IV SOSY
PREFILLED_SYRINGE | INTRAVENOUS | Status: DC | PRN
  Administered 2020-01-26: 40 mg via INTRAVENOUS

## 2020-01-26 MED ORDER — PROPOFOL 500 MG/50ML IV EMUL
INTRAVENOUS | Status: DC | PRN
  Administered 2020-01-26: 75 ug/kg/min via INTRAVENOUS

## 2020-01-26 MED ORDER — ACETAMINOPHEN 160 MG/5ML PO SOLN: 325.0000 mg | ORAL | Status: DC | PRN

## 2020-01-26 MED ORDER — OXYCODONE HCL 5 MG/5ML PO SOLN: 5.0000 mg | Freq: Once | ORAL | Status: DC | PRN

## 2020-01-26 MED ORDER — LACTATED RINGERS IV SOLN: INTRAVENOUS | Status: DC

## 2020-01-26 MED ORDER — BUPIVACAINE HCL (PF) 0.5 % IJ SOLN
INTRAMUSCULAR | Status: DC | PRN
  Administered 2020-01-26: 20 mL

## 2020-01-26 MED ORDER — DOCUSATE SODIUM 100 MG PO CAPS: 100.0000 mg | ORAL_CAPSULE | Freq: Every day | ORAL | 2 refills | Status: DC | PRN

## 2020-01-26 MED ORDER — EPINEPHRINE (ANAPHYLAXIS) 30 MG/30ML IJ SOLN
INTRAMUSCULAR | Status: DC | PRN
  Administered 2020-01-26: 4 mg

## 2020-01-26 MED ORDER — OXYCODONE HCL 5 MG PO TABS: 5.0000 mg | ORAL_TABLET | Freq: Once | ORAL | Status: DC | PRN

## 2020-01-26 MED ORDER — BUPIVACAINE LIPOSOME 1.3 % IJ SUSP
INTRAMUSCULAR | Status: DC | PRN
  Administered 2020-01-26: 266 mg via PERINEURAL

## 2020-01-26 MED ORDER — CHLORHEXIDINE GLUCONATE 4 % EX LIQD: 60.0000 mL | Freq: Once | CUTANEOUS | Status: DC

## 2020-01-26 MED ORDER — HYDROCODONE-ACETAMINOPHEN 5-325 MG PO TABS: 1.0000 | ORAL_TABLET | ORAL | 0 refills | Status: DC | PRN

## 2020-01-26 MED ORDER — FENTANYL CITRATE (PF) 100 MCG/2ML IJ SOLN: 25.0000 ug | INTRAMUSCULAR | Status: DC | PRN

## 2020-01-26 MED ORDER — DEXAMETHASONE SODIUM PHOSPHATE 4 MG/ML IJ SOLN
INTRAMUSCULAR | Status: DC | PRN
  Administered 2020-01-26: 4 mg via INTRAVENOUS

## 2020-01-26 MED ORDER — DEXAMETHASONE SODIUM PHOSPHATE 10 MG/ML IJ SOLN: INTRAMUSCULAR | Status: DC | PRN

## 2020-01-26 MED ORDER — PROPOFOL 10 MG/ML IV BOLUS
INTRAVENOUS | Status: DC | PRN
  Administered 2020-01-26: 50 mg via INTRAVENOUS
  Administered 2020-01-26: 25 mg via INTRAVENOUS
  Administered 2020-01-26 (×2): 20 mg via INTRAVENOUS
  Administered 2020-01-26: 30 mg via INTRAVENOUS

## 2020-01-26 MED ORDER — ONDANSETRON HCL 4 MG/2ML IJ SOLN
INTRAMUSCULAR | Status: DC | PRN
  Administered 2020-01-26: 4 mg via INTRAVENOUS

## 2020-01-26 MED ORDER — MIDAZOLAM HCL 2 MG/2ML IJ SOLN
INTRAMUSCULAR | Status: DC | PRN
  Administered 2020-01-26: 2 mg via INTRAVENOUS

## 2020-01-26 MED ORDER — CEFAZOLIN SODIUM-DEXTROSE 2-4 GM/100ML-% IV SOLN
2.0000 g | INTRAVENOUS | Status: AC
  Administered 2020-01-26: 2 g via INTRAVENOUS

## 2020-01-26 SURGICAL SUPPLY — 56 items
ADAPTER IRRIG TUBE 2 SPIKE SOL (ADAPTER) ×4 IMPLANT
ANCHOR SUT CROSSFT 4.75 (Anchor) ×2 IMPLANT
ANCHOR YKNOT PRO RC BLUE TAPE (Anchor) ×1 IMPLANT
ANCHOR YKNOT PRO RC HI-FI TAPE (Anchor) ×1 IMPLANT
BLADE FULL RADIUS 3.5 (BLADE) ×2 IMPLANT
BUR ABRADER 4.0 W/FLUTE AQUA (MISCELLANEOUS) IMPLANT
BUR ACROMIONIZER 4.0 (BURR) ×1 IMPLANT
BURR ABRADER 4.0 W/FLUTE AQUA (MISCELLANEOUS) ×2
CANNULA SHOULDER 7CM (CANNULA) ×2 IMPLANT
CANNULA TWIST IN 8.25X7CM (CANNULA) ×1 IMPLANT
CHLORAPREP W/TINT 26 (MISCELLANEOUS) ×2 IMPLANT
COOLER POLAR GLACIER W/PUMP (MISCELLANEOUS) ×3 IMPLANT
COVER WAND RF STERILE (DRAPES) ×2 IMPLANT
DRAPE IMP U-DRAPE 54X76 (DRAPES) ×4 IMPLANT
DRAPE SHEET LG 3/4 BI-LAMINATE (DRAPES) ×2 IMPLANT
DRAPE STERI 35X30 U-POUCH (DRAPES) ×2 IMPLANT
DRAPE U-SHAPE 48X52 POLY STRL (PACKS) ×1 IMPLANT
ELECT REM PT RETURN 9FT ADLT (ELECTROSURGICAL) ×2
ELECTRODE REM PT RTRN 9FT ADLT (ELECTROSURGICAL) ×1 IMPLANT
GAUZE SPONGE 4X4 12PLY STRL (GAUZE/BANDAGES/DRESSINGS) ×2 IMPLANT
GAUZE XEROFORM 1X8 LF (GAUZE/BANDAGES/DRESSINGS) ×2 IMPLANT
GLOVE BIOGEL PI IND STRL 8 (GLOVE) ×1 IMPLANT
GLOVE BIOGEL PI INDICATOR 8 (GLOVE) ×2
GLOVE SURG ORTHO 8.0 STRL STRW (GLOVE) ×3 IMPLANT
GOWN STRL REUS W/ TWL LRG LVL3 (GOWN DISPOSABLE) ×1 IMPLANT
GOWN STRL REUS W/ TWL XL LVL3 (GOWN DISPOSABLE) ×1 IMPLANT
GOWN STRL REUS W/TWL LRG LVL3 (GOWN DISPOSABLE) ×1
GOWN STRL REUS W/TWL XL LVL3 (GOWN DISPOSABLE) ×2
IV LACTATED RINGER IRRG 3000ML (IV SOLUTION) ×8
IV LR IRRIG 3000ML ARTHROMATIC (IV SOLUTION) ×6 IMPLANT
KIT STABILIZATION SHOULDER (MISCELLANEOUS) ×2 IMPLANT
KIT TURNOVER KIT A (KITS) ×2 IMPLANT
MANIFOLD NEPTUNE II (INSTRUMENTS) ×3 IMPLANT
MASK FACE SPIDER DISP (MASK) ×2 IMPLANT
MAT ABSORB  FLUID 56X50 GRAY (MISCELLANEOUS) ×1
MAT ABSORB FLUID 56X50 GRAY (MISCELLANEOUS) ×1 IMPLANT
NDL SCORPION MULTI FIRE (NEEDLE) IMPLANT
NDL SPNL 18GX3.5 QUINCKE PK (NEEDLE) ×1 IMPLANT
NEEDLE HYPO 22GX1.5 SAFETY (NEEDLE) ×2 IMPLANT
NEEDLE SCORPION MULTI FIRE (NEEDLE) ×2 IMPLANT
NEEDLE SPNL 18GX3.5 QUINCKE PK (NEEDLE) ×2 IMPLANT
PACK ARTHROSCOPY SHOULDER (MISCELLANEOUS) ×2 IMPLANT
PAD WRAPON POLAR SHDR UNIV (MISCELLANEOUS) IMPLANT
PAD WRAPON POLAR SHDR XLG (MISCELLANEOUS) IMPLANT
SHEATH SHORT HANDLE 4.0 (SHEATH) ×1 IMPLANT
SLING ARM LRG DEEP (SOFTGOODS) IMPLANT
SLING ULTRA II M (MISCELLANEOUS) ×1 IMPLANT
SUT ETHILON 4 0 PS 2 18 (SUTURE) ×2 IMPLANT
SUT PDSII 0 (SUTURE) ×1 IMPLANT
SYR 10ML LL (SYRINGE) ×2 IMPLANT
SYR 50ML LL SCALE MARK (SYRINGE) ×2 IMPLANT
TUBING ARTHRO INFLOW-ONLY STRL (TUBING) ×2 IMPLANT
TUBING CONNECTING 10 (TUBING) ×2 IMPLANT
WAND WEREWOLF FLOW 90D (MISCELLANEOUS) ×2 IMPLANT
WRAPON POLAR PAD SHDR UNIV (MISCELLANEOUS) ×2
WRAPON POLAR PAD SHDR XLG (MISCELLANEOUS) ×2

## 2020-01-26 NOTE — Op Note (Signed)
01/26/2020  3:47 PM  PATIENT:  Angel Floyd  69 y.o. female  PRE-OPERATIVE DIAGNOSIS:  M75.121 complete rotator cuff tear or rupture of right shoulder  POST-OPERATIVE DIAGNOSIS:  M75.121 complete rotator cuff tear or rupture of right shoulder  PROCEDURE:  Procedure(s) with comments: SHOULDER ARTHROSCOPY WITH ROTATOR CUFF REPAIR with distal clavicle resection, biceps tnotomy, subacromial decompression (Right) - Diabetic - oral meds  SURGEON:  Surgeon(s) and Role:    * Bowers, James R, MD - Primary  ASSIST: Maurice Jones, PA-C  ANESTHESIA:   regional and general   PREOPERATIVE INDICATIONS:  Angel Floyd is a  69 y.o. female with a diagnosis of M75.121 complete rotator cuff tear or rupture of right shoulder who failed conservative measures and elected for surgical management.    The risks benefits and alternatives were discussed with the patient preoperatively including but not limited to the risks of infection, bleeding, nerve injury, persistent pain or weakness, failure of the hardware, re-tear of the rotator cuff and the need for further surgery. Medical risks include DVT and pulmonary embolism, myocardial infarction, stroke, pneumonia, respiratory failure and death. Patient understood these risks and wished to proceed.  OPERATIVE IMPLANTS: [employer] SpeedBridge System  OPERATIVE PROCEDURE: The patient was met in the preoperative area. The right shoulder was signed with my initials according the hospital's correct site of surgery protocol. The patient is brought to the OR and underwent a supraclavicular block and general endotracheal intubation by the anesthesia service.  The patient was placed in a beachchair position.  A spider arm positioner was used for this case. Examination under anesthesia revealed negative sulcus and no anterior or posterior instability.  The patient was prepped and draped in a sterile fashion. A timeout was performed to verify the patient's name, date of  birth, medical record number, correct site of surgery and correct procedure to be performed there was also used to verify the patient received antibiotics that all appropriate instruments, implants and radiographs studies were available in the room. Once all in attendance were in agreement case began.  Bony landmarks were drawn out with a surgical marker along with proposed arthroscopy incisions. These were pre-injected with 1% lidocaine plain. An 11 blade was used to establish a posterior portal through which the arthroscope was placed in the glenohumeral joint. A full diagnostic examination of the shoulder was performed.  The anterior portal was established under direct visualization with an 18-gauge spinal needle.  A 5.75 mm arthroscopic cannula was placed through the anterior portal.   The intra-articular portion of the biceps tendon was found to have a partial tear involving greater than 50% of the diameter. Therefore the decision was made to perform a tenotomy. An arthrscopic wand was used to release the biceps tendon off the superior labrum. The arthroscopic shaver was then used to debride the frayed edges of the labrum. There were no anterior or superior labral tears seen.  Subscapularis tendon was intact. Patient had a full-thickness tear involving the supraspinatus without retraction. There were no loose bodies within the inferior recess and no evidence of HAGL lesion.  The arthroscope was then placed in the subacromial space. A lateral portal was then established using an 18-gauge spinal needle for localization.   The greater tuberosity was debrided using a 5.5 mm resector shaver blade to remove all remaining foreign fibers of the rotator cuff.  Debridement was performed until punctate bleeding was seen at the greater tuberosity footprint, which will allow for rotator cuff healing.  Extensive   bursitis was encountered and debrided using a 4-0 resector shaver blade and a 90 ArthroCare wand from the  lateral portal. Using the Suture Bridge system medial anchors with fiber tape were placed. The cuff was mobilized and the tape passed through the rotator cuff. The tape was then crossed in usual fashion and fixated on the lateral side with two CrossFT anchors. The final construct was stable and moved as a unit with excellent coverage of the humeral head.  Using the burr a subacromial decompression was performed from the lateral portal. Next using the burr through the anterior portal, 8 mm of distal clavicle were resected.  All incisions were copiously irrigated. Skin closure for the arthroscopic incisions was performed with 4-0 nylon.  A dry sterile dressing including Steri-Strips was applied .  The patient was placed in an abduction sling.  All sharp and instrument counts were correct at the conclusion of the case. I was scrubbed and present for the entire case. I spoke with the patient's family in the post-op consultation room and informed them that the case had been performed without complication and the patient was stable in recovery room.   Kurtis Bushman, MD

## 2020-01-26 NOTE — Transfer of Care (Signed)
Immediate Anesthesia Transfer of Care Note  Patient: Angel Floyd  Procedure(s) Performed: SHOULDER ARTHROSCOPY WITH ROTATOR CUFF REPAIR with distal clavicle resection, biceps tnotomy, subacromial decompression (Right Shoulder)  Patient Location: PACU  Anesthesia Type: General, Regional  Level of Consciousness: awake, alert  and patient cooperative  Airway and Oxygen Therapy: Patient Spontanous Breathing and Patient connected to supplemental oxygen  Post-op Assessment: Post-op Vital signs reviewed, Patient's Cardiovascular Status Stable, Respiratory Function Stable, Patent Airway and No signs of Nausea or vomiting  Post-op Vital Signs: Reviewed and stable  Complications: No apparent anesthesia complications

## 2020-01-26 NOTE — Anesthesia Procedure Notes (Signed)
Procedure Name: MAC Performed by: Vanetta Shawl, CRNA Pre-anesthesia Checklist: Patient identified, Emergency Drugs available, Suction available, Timeout performed and Patient being monitored Patient Re-evaluated:Patient Re-evaluated prior to induction Oxygen Delivery Method: Nasal cannula Induction Type: IV induction Placement Confirmation: positive ETCO2

## 2020-01-26 NOTE — Anesthesia Procedure Notes (Signed)
Anesthesia Regional Block: Interscalene brachial plexus block   Pre-Anesthetic Checklist: ,, timeout performed, Correct Patient, Correct Site, Correct Laterality, Correct Procedure, Correct Position, site marked, Risks and benefits discussed,  Surgical consent,  Pre-op evaluation,  At surgeon's request and post-op pain management  Laterality: Right  Prep: chloraprep       Needles:  Injection technique: Single-shot  Needle Type: Stimiplex     Needle Length: 9cm  Needle Gauge: 21     Additional Needles:   Procedures:,,,, ultrasound used (permanent image in chart),,,,  Narrative:  Start time: 01/26/2020 12:50 PM End time: 01/26/2020 12:58 PM Injection made incrementally with aspirations every 5 mL.  Performed by: Personally  Anesthesiologist: Carlos American, MD

## 2020-01-26 NOTE — H&P (Signed)
The patient has been re-examined, and the chart reviewed, and there have been no interval changes to the documented history and physical.  Plan a right shoulder scope today.  Anesthesia is consulted regarding a peripheral nerve block for post-operative pain.  The risks, benefits, and alternatives have been discussed at length, and the patient is willing to proceed.    

## 2020-01-26 NOTE — Anesthesia Postprocedure Evaluation (Signed)
Anesthesia Post Note  Patient: Atheena E Kurowski  Procedure(s) Performed: SHOULDER ARTHROSCOPY WITH ROTATOR CUFF REPAIR with distal clavicle resection, biceps tnotomy, subacromial decompression (Right Shoulder)     Patient location during evaluation: PACU Anesthesia Type: Regional Level of consciousness: awake and alert Pain management: pain level controlled Vital Signs Assessment: post-procedure vital signs reviewed and stable Respiratory status: spontaneous breathing, nonlabored ventilation, respiratory function stable and patient connected to nasal cannula oxygen Cardiovascular status: blood pressure returned to baseline and stable Postop Assessment: no apparent nausea or vomiting Anesthetic complications: no    Wanda Plump Cookie Pore

## 2020-01-26 NOTE — Discharge Instructions (Signed)
Wear sling at all times, including sleep.  You will need to use the sling for a total of 4 weeks following surgery.  Do not try and lift your arm up or away from your body for any reason.   Keep the dressing dry.  You may remove bandage in 3 days.  You may place Band-Aids over top of the incisions.  May shower once dressing is removed in 3 days.  Remove sling carefully only for showers, leaving arm down by your side while in the shower.  +++ Make sure to take some pain medication this evening before you fall asleep, in preparation for the nerve block wearing off in the middle of the night.  If the the pain medication causes itching, or is too strong, try taking a single tablet at a time, or combining with Benadryl.  You may be most comfortable sleeping in a recliner.  If you do sleep in near bed, placed pillows behind the shoulder that have the operation to support it.     General Anesthesia, Adult, Care After This sheet gives you information about how to care for yourself after your procedure. Your health care provider may also give you more specific instructions. If you have problems or questions, contact your health care provider. What can I expect after the procedure? After the procedure, the following side effects are common:  Pain or discomfort at the IV site.  Nausea.  Vomiting.  Sore throat.  Trouble concentrating.  Feeling cold or chills.  Weak or tired.  Sleepiness and fatigue.  Soreness and body aches. These side effects can affect parts of the body that were not involved in surgery. Follow these instructions at home:  For at least 24 hours after the procedure:  Have a responsible adult stay with you. It is important to have someone help care for you until you are awake and alert.  Rest as needed.  Do not: ? Participate in activities in which you could fall or become injured. ? Drive. ? Use heavy machinery. ? Drink alcohol. ? Take sleeping pills or  medicines that cause drowsiness. ? Make important decisions or sign legal documents. ? Take care of children on your own. Eating and drinking  Follow any instructions from your health care provider about eating or drinking restrictions.  When you feel hungry, start by eating small amounts of foods that are soft and easy to digest (bland), such as toast. Gradually return to your regular diet.  Drink enough fluid to keep your urine pale yellow.  If you vomit, rehydrate by drinking water, juice, or clear broth. General instructions  If you have sleep apnea, surgery and certain medicines can increase your risk for breathing problems. Follow instructions from your health care provider about wearing your sleep device: ? Anytime you are sleeping, including during daytime naps. ? While taking prescription pain medicines, sleeping medicines, or medicines that make you drowsy.  Return to your normal activities as told by your health care provider. Ask your health care provider what activities are safe for you.  Take over-the-counter and prescription medicines only as told by your health care provider.  If you smoke, do not smoke without supervision.  Keep all follow-up visits as told by your health care provider. This is important. Contact a health care provider if:  You have nausea or vomiting that does not get better with medicine.  You cannot eat or drink without vomiting.  You have pain that does not get better with medicine.  You are unable to pass urine.  You develop a skin rash.  You have a fever.  You have redness around your IV site that gets worse. Get help right away if:  You have difficulty breathing.  You have chest pain.  You have blood in your urine or stool, or you vomit blood. Summary  After the procedure, it is common to have a sore throat or nausea. It is also common to feel tired.  Have a responsible adult stay with you for the first 24 hours after general  anesthesia. It is important to have someone help care for you until you are awake and alert.  When you feel hungry, start by eating small amounts of foods that are soft and easy to digest (bland), such as toast. Gradually return to your regular diet.  Drink enough fluid to keep your urine pale yellow.  Return to your normal activities as told by your health care provider. Ask your health care provider what activities are safe for you. This information is not intended to replace advice given to you by your health care provider. Make sure you discuss any questions you have with your health care provider. Document Revised: 11/09/2017 Document Reviewed: 06/22/2017 Elsevier Patient Education  [place].

## 2020-01-26 NOTE — Anesthesia Preprocedure Evaluation (Signed)
Anesthesia Evaluation  Patient identified by MRN, date of birth, ID band Patient awake    History of Anesthesia Complications Negative for: history of anesthetic complications  Airway Mallampati: II  TM Distance: >3 FB Neck ROM: Full    Dental   Pulmonary    Pulmonary exam normal        Cardiovascular hypertension, Normal cardiovascular exam     Neuro/Psych TIA   GI/Hepatic GERD  ,  Endo/Other  diabetes  Renal/GU      Musculoskeletal   Abdominal   Peds  Hematology   Anesthesia Other Findings   Reproductive/Obstetrics                             Anesthesia Physical Anesthesia Plan  ASA: II  Anesthesia Plan: General and Regional   Post-op Pain Management: GA combined w/ Regional for post-op pain   Induction: Intravenous  PONV Risk Score and Plan: 2 and Ondansetron and Promethazine  Airway Management Planned: Natural Airway  Additional Equipment:   Intra-op Plan:   Post-operative Plan:   Informed Consent: I have reviewed the patients History and Physical, chart, labs and discussed the procedure including the risks, benefits and alternatives for the proposed anesthesia with the patient or authorized representative who has indicated his/her understanding and acceptance.       Plan Discussed with: CRNA, Anesthesiologist and Surgeon  Anesthesia Plan Comments:         Anesthesia Quick Evaluation

## 2020-01-27 ENCOUNTER — Encounter: Payer: Self-pay | Admitting: *Deleted

## 2020-01-29 ENCOUNTER — Other Ambulatory Visit: Payer: Self-pay

## 2020-01-29 ENCOUNTER — Other Ambulatory Visit (INDEPENDENT_AMBULATORY_CARE_PROVIDER_SITE_OTHER): Payer: Medicare HMO

## 2020-01-29 DIAGNOSIS — R319 Hematuria, unspecified: Secondary | ICD-10-CM | POA: Diagnosis not present

## 2020-01-29 LAB — URINALYSIS, ROUTINE W REFLEX MICROSCOPIC
Bilirubin Urine: NEGATIVE
Hgb urine dipstick: NEGATIVE
Ketones, ur: NEGATIVE
Leukocytes,Ua: NEGATIVE
Nitrite: NEGATIVE
RBC / HPF: NONE SEEN (ref 0–?)
Specific Gravity, Urine: 1.02 (ref 1.000–1.030)
Total Protein, Urine: NEGATIVE
Urine Glucose: >=1000 — AB
Urobilinogen, UA: 1 (ref 0.0–1.0)
pH: 6.5 (ref 5.0–8.0)

## 2020-01-30 ENCOUNTER — Encounter: Payer: Self-pay | Admitting: Internal Medicine

## 2020-03-15 ENCOUNTER — Other Ambulatory Visit (INDEPENDENT_AMBULATORY_CARE_PROVIDER_SITE_OTHER): Payer: Medicare HMO

## 2020-03-15 ENCOUNTER — Other Ambulatory Visit: Payer: Self-pay

## 2020-03-15 DIAGNOSIS — I10 Essential (primary) hypertension: Secondary | ICD-10-CM | POA: Diagnosis not present

## 2020-03-15 DIAGNOSIS — E1149 Type 2 diabetes mellitus with other diabetic neurological complication: Secondary | ICD-10-CM

## 2020-03-15 DIAGNOSIS — E7800 Pure hypercholesterolemia, unspecified: Secondary | ICD-10-CM | POA: Diagnosis not present

## 2020-03-15 DIAGNOSIS — D7219 Other eosinophilia: Secondary | ICD-10-CM

## 2020-03-15 LAB — CBC WITH DIFFERENTIAL/PLATELET
Basophils Absolute: 0 10*3/uL (ref 0.0–0.1)
Basophils Relative: 0.5 % (ref 0.0–3.0)
Eosinophils Absolute: 0.2 10*3/uL (ref 0.0–0.7)
Eosinophils Relative: 2.6 % (ref 0.0–5.0)
HCT: 40.2 % (ref 36.0–46.0)
Hemoglobin: 13.7 g/dL (ref 12.0–15.0)
Lymphocytes Relative: 20.5 % (ref 12.0–46.0)
Lymphs Abs: 1.3 10*3/uL (ref 0.7–4.0)
MCHC: 34 g/dL (ref 30.0–36.0)
MCV: 89.9 fl (ref 78.0–100.0)
Monocytes Absolute: 0.7 10*3/uL (ref 0.1–1.0)
Monocytes Relative: 10 % (ref 3.0–12.0)
Neutro Abs: 4.3 10*3/uL (ref 1.4–7.7)
Neutrophils Relative %: 66.4 % (ref 43.0–77.0)
Platelets: 188 10*3/uL (ref 150.0–400.0)
RBC: 4.47 Mil/uL (ref 3.87–5.11)
RDW: 13.5 % (ref 11.5–15.5)
WBC: 6.5 10*3/uL (ref 4.0–10.5)

## 2020-03-15 LAB — LIPID PANEL
Cholesterol: 145 mg/dL (ref 0–200)
HDL: 51 mg/dL (ref >39.00)
LDL Cholesterol: 82 mg/dL (ref 0–99)
NonHDL: 94.38
Total CHOL/HDL Ratio: 3
Triglycerides: 64 mg/dL (ref 0.0–149.0)
VLDL: 12.8 mg/dL (ref 0.0–40.0)

## 2020-03-15 LAB — BASIC METABOLIC PANEL
BUN: 20 mg/dL (ref 6–23)
CO2: 31 mEq/L (ref 19–32)
Calcium: 8.9 mg/dL (ref 8.4–10.5)
Chloride: 104 mEq/L (ref 96–112)
Creatinine, Ser: 0.75 mg/dL (ref 0.40–1.20)
GFR: 76.83 mL/min (ref >60.00)
Glucose, Bld: 134 mg/dL — ABNORMAL HIGH (ref 70–99)
Potassium: 3.5 mEq/L (ref 3.5–5.1)
Sodium: 141 mEq/L (ref 135–145)

## 2020-03-15 LAB — MICROALBUMIN / CREATININE URINE RATIO
Creatinine,U: 165.4 mg/dL
Microalb Creat Ratio: 0.8 mg/g (ref 0.0–30.0)
Microalb, Ur: 1.4 mg/dL (ref 0.0–1.9)

## 2020-03-15 LAB — HEPATIC FUNCTION PANEL
ALT: 15 U/L (ref 0–35)
AST: 15 U/L (ref 0–37)
Albumin: 4.2 g/dL (ref 3.5–5.2)
Alkaline Phosphatase: 57 U/L (ref 39–117)
Bilirubin, Direct: 0.2 mg/dL (ref 0.0–0.3)
Total Bilirubin: 0.7 mg/dL (ref 0.2–1.2)
Total Protein: 6.3 g/dL (ref 6.0–8.3)

## 2020-03-15 LAB — HEMOGLOBIN A1C: Hgb A1c MFr Bld: 6.3 % (ref 4.6–6.5)

## 2020-03-16 ENCOUNTER — Ambulatory Visit (INDEPENDENT_AMBULATORY_CARE_PROVIDER_SITE_OTHER): Payer: Medicare HMO | Admitting: Internal Medicine

## 2020-03-16 ENCOUNTER — Encounter: Payer: Self-pay | Admitting: Internal Medicine

## 2020-03-16 ENCOUNTER — Other Ambulatory Visit: Payer: Self-pay

## 2020-03-16 VITALS — BP 126/74 | HR 78 | Temp 97.7°F | Resp 16 | Ht 65.0 in | Wt 152.0 lb

## 2020-03-16 DIAGNOSIS — R7989 Other specified abnormal findings of blood chemistry: Secondary | ICD-10-CM

## 2020-03-16 DIAGNOSIS — Z0000 Encounter for general adult medical examination without abnormal findings: Secondary | ICD-10-CM

## 2020-03-16 DIAGNOSIS — I10 Essential (primary) hypertension: Secondary | ICD-10-CM | POA: Diagnosis not present

## 2020-03-16 DIAGNOSIS — Z853 Personal history of malignant neoplasm of breast: Secondary | ICD-10-CM

## 2020-03-16 DIAGNOSIS — E1149 Type 2 diabetes mellitus with other diabetic neurological complication: Secondary | ICD-10-CM | POA: Diagnosis not present

## 2020-03-16 DIAGNOSIS — M25511 Pain in right shoulder: Secondary | ICD-10-CM

## 2020-03-16 DIAGNOSIS — D696 Thrombocytopenia, unspecified: Secondary | ICD-10-CM

## 2020-03-16 DIAGNOSIS — D508 Other iron deficiency anemias: Secondary | ICD-10-CM

## 2020-03-16 DIAGNOSIS — R945 Abnormal results of liver function studies: Secondary | ICD-10-CM

## 2020-03-16 DIAGNOSIS — E7800 Pure hypercholesterolemia, unspecified: Secondary | ICD-10-CM

## 2020-03-16 NOTE — Assessment & Plan Note (Signed)
Physical today 03/16/20.  PAP 09/20/17 - negative with negative HPV.  Colonoscopy 10/2017 - 38mm polyp and diverticulosis.

## 2020-03-16 NOTE — Progress Notes (Signed)
Patient ID: Angel Floyd, female   DOB: 1951/04/21, 69 y.o.   MRN: [id number]   Subjective:    Patient ID: Angel Floyd, female    DOB: 12-07-1950, 69 y.o.   MRN: [id number]  HPI This visit occurred during the SARS-CoV-2 public health emergency.  Safety protocols were in place, including screening questions prior to the visit, additional usage of staff PPE, and extensive cleaning of exam room while observing appropriate contact time as indicated for disinfecting solutions.  Patient here for her physical exam.  S/p right shoulder surgery in 01/2020.  Doing well.  Going to therapy.  Still with limited rom.   Tries to stay active.  No chest pain reported.  Occasional sob.  Feels related to having covid and recent surgery, etc.  No chest congestion or cough.  No acid reflux.  No abdominal pain.  Saw dermatology yesterday.  Removal of two basal cells.  Discussed further w/up including cxr and ekg - given sob.  She declines.  Wants to follow.  Has f/u with cardiology.    Past Medical History:  Diagnosis Date  . Anemia   . Breast cancer ([place])   . COVID-19 08/05/2019  . Diabetes mellitus ([place])   . Dyspnea    better since recovery from COVID  . GERD (gastroesophageal reflux disease)   . Heart murmur   . History of abnormal Pap smear    class III, required cryosurgery  . Hypercholesterolemia   . Hypertension   . TIA (transient ischemic attack)    2008, 2018. no deficit   Past Surgical History:  Procedure Laterality Date  . BREAST CYST ASPIRATION  1989  . COLONOSCOPY WITH PROPOFOL N/A 11/05/2017   Procedure: COLONOSCOPY WITH PROPOFOL;  Surgeon: Lollie Sails, MD;  Location: [facility] ENDOSCOPY;  Service: Endoscopy;  Laterality: N/A;  . DILATION AND CURETTAGE OF UTERUS    . ESOPHAGOGASTRODUODENOSCOPY (EGD) WITH PROPOFOL N/A 11/05/2017   Procedure: ESOPHAGOGASTRODUODENOSCOPY (EGD) WITH PROPOFOL;  Surgeon: Lollie Sails, MD;  Location: [facility] ENDOSCOPY;  Service: Endoscopy;  Laterality: N/A;    . FOOT SURGERY     morton's neuroma removed - right foot  . FOOT SURGERY  10/2009   achilles tendon release with reconstructive surgery  . GYNECOLOGIC CRYOSURGERY     class III pap  . HAND SURGERY     cyst removed - left hand  . hysteroscopy and D&C  7/09  . KNEE ARTHROSCOPY Left [id number]   removed tear & resurfaced area  . KNEE SURGERY  09/20/12   torn meniscus, (Dr Leanor Kail)  . SHOULDER ARTHROSCOPY WITH ROTATOR CUFF REPAIR Right 01/26/2020   Procedure: SHOULDER ARTHROSCOPY WITH ROTATOR CUFF REPAIR with distal clavicle resection, biceps tnotomy, subacromial decompression;  Surgeon: Lovell Sheehan, MD;  Location: [place];  Service: Orthopedics;  Laterality: Right;  Diabetic - oral meds  . TUBAL LIGATION     Family History  Problem Relation Age of Onset  . Lung cancer Father   . Alcohol abuse Father   . Cancer Father        lung  . CVA Mother   . Colon cancer Mother   . Alcohol abuse Mother   . Cancer Mother        colon  . Stroke Mother   . Colon cancer Sister   . Diabetes Maternal Grandfather    Social History   Socioeconomic History  . Marital status: Married    Spouse name: Not on file  . Number of  children: 2  . Years of education: Not on file  . Highest education level: Not on file  Occupational History  . Not on file  Tobacco Use  . Smoking status: Never Smoker  . Smokeless tobacco: Never Used  Substance and Sexual Activity  . Alcohol use: No    Alcohol/week: 0.0 standard drinks  . Drug use: No  . Sexual activity: Not Currently  Other Topics Concern  . Not on file  Social History Narrative  . Not on file   Social Determinants of Health   Financial Resource Strain: Low Risk   . Difficulty of Paying Living Expenses: Not hard at all  Food Insecurity: No Food Insecurity  . Worried About Charity fundraiser in the Last Year: Never true  . Ran Out of Food in the Last Year: Never true  Transportation Needs: No Transportation Needs  .  Lack of Transportation (Medical): No  . Lack of Transportation (Non-Medical): No  Physical Activity:   . Days of Exercise per Week:   . Minutes of Exercise per Session:   Stress: No Stress Concern Present  . Feeling of Stress : Only a little  Social Connections:   . Frequency of Communication with Friends and Family:   . Frequency of Social Gatherings with Friends and Family:   . Attends Religious Services:   . Active Member of Clubs or Organizations:   . Attends Archivist Meetings:   Marland Kitchen Marital Status:     Outpatient Encounter Medications as of 03/16/2020  Medication Sig  . aspirin 81 MG tablet Take 81 mg by mouth daily.  . clopidogrel (PLAVIX) 75 MG tablet Take 1 tablet (75 mg total) by mouth daily.  . Fe Fum-FePoly-Vit C-Vit B3 (INTEGRA) 62.5-62.5-40-3 MG CAPS Take 1 capsule by mouth daily.  . fish oil-omega-3 fatty acids 1000 MG capsule Take 2 g by mouth daily.   Marland Kitchen glucose blood test strip Use as instructed to check blood sugars three times daily. Has One Touch Ultra glucometer. Dx 250.00  . HYDROcodone-acetaminophen (NORCO/VICODIN) 5-325 MG tablet Take 1 tablet by mouth every 4 (four) hours as needed for moderate pain.  . metFORMIN (GLUCOPHAGE) 500 MG tablet TAKE 1 TABLET DAILY  . pantoprazole (PROTONIX) 40 MG tablet TAKE 1 TABLET DAILY  . rosuvastatin (CRESTOR) 10 MG tablet Take 1 tablet (10 mg total) by mouth daily.  Marland Kitchen telmisartan (MICARDIS) 20 MG tablet Take 1 tablet (20 mg total) by mouth daily.  . [DISCONTINUED] docusate sodium (COLACE) 100 MG capsule Take 1 capsule (100 mg total) by mouth daily as needed.  . [DISCONTINUED] fluticasone furoate-vilanterol (BREO ELLIPTA) 200-25 MCG/INH AEPB Inhale 1 puff into the lungs daily. (Patient not taking: Reported on 01/19/2020)  . [DISCONTINUED] nitrofurantoin, macrocrystal-monohydrate, (MACROBID) 100 MG capsule Take 1 capsule (100 mg total) by mouth 2 (two) times daily. (Patient not taking: Reported on 01/26/2020)   No  facility-administered encounter medications on file as of 03/16/2020.    Review of Systems  Constitutional: Negative for appetite change and unexpected weight change.  HENT: Negative for congestion and sinus pressure.   Eyes: Negative for pain and visual disturbance.  Respiratory: Negative for cough, chest tightness and shortness of breath.   Cardiovascular: Negative for chest pain, palpitations and leg swelling.  Gastrointestinal: Negative for abdominal pain, diarrhea, nausea and vomiting.  Genitourinary: Negative for difficulty urinating and dysuria.  Musculoskeletal: Negative for joint swelling and myalgias.       S/p right shoulder surgery.  Still with limited  rom.  Going to therapy.   Skin: Negative for color change and rash.  Neurological: Negative for dizziness, light-headedness and headaches.  Hematological: Negative for adenopathy. Does not bruise/bleed easily.  Psychiatric/Behavioral: Negative for agitation and dysphoric mood.       Objective:    Physical Exam Vitals reviewed.  Constitutional:      General: She is not in acute distress.    Appearance: Normal appearance. She is well-developed.  HENT:     Head: Normocephalic and atraumatic.     Right Ear: External ear normal.     Left Ear: External ear normal.  Eyes:     General: No scleral icterus.       Right eye: No discharge.        Left eye: No discharge.     Conjunctiva/sclera: Conjunctivae normal.  Neck:     Thyroid: No thyromegaly.  Cardiovascular:     Rate and Rhythm: Normal rate and regular rhythm.  Pulmonary:     Effort: No tachypnea, accessory muscle usage or respiratory distress.     Breath sounds: Normal breath sounds. No decreased breath sounds or wheezing.  Chest:     Breasts:        Right: No inverted nipple, mass, nipple discharge or tenderness (no axillary adenopathy).        Left: No inverted nipple, mass, nipple discharge or tenderness (no axilarry adenopathy).  Abdominal:     General: Bowel  sounds are normal.     Palpations: Abdomen is soft.     Tenderness: There is no abdominal tenderness.  Musculoskeletal:        General: No swelling or tenderness.     Cervical back: Neck supple. No tenderness.  Lymphadenopathy:     Cervical: No cervical adenopathy.  Skin:    Findings: No erythema or rash.  Neurological:     Mental Status: She is alert and oriented to person, place, and time.  Psychiatric:        Mood and Affect: Mood normal.        Behavior: Behavior normal.     BP 126/74   Pulse 78   Temp 97.7 F (36.5 C)   Resp 16   Ht '5\' 5"'  (1.651 m)   Wt 152 lb (68.9 kg)   SpO2 99%   BMI 25.29 kg/m  Wt Readings from Last 3 Encounters:  03/16/20 152 lb (68.9 kg)  01/26/20 148 lb (67.1 kg)  01/09/20 147 lb (66.7 kg)     Lab Results  Component Value Date   WBC 6.5 03/15/2020   HGB 13.7 03/15/2020   HCT 40.2 03/15/2020   PLT 188.0 03/15/2020   GLUCOSE 134 (H) 03/15/2020   CHOL 145 03/15/2020   TRIG 64.0 03/15/2020   HDL 51.00 03/15/2020   LDLCALC 82 03/15/2020   ALT 15 03/15/2020   AST 15 03/15/2020   NA 141 03/15/2020   K 3.5 03/15/2020   CL 104 03/15/2020   CREATININE 0.75 03/15/2020   BUN 20 03/15/2020   CO2 31 03/15/2020   TSH 1.485 09/24/2019   INR 0.95 04/08/2017   HGBA1C 6.3 03/15/2020   MICROALBUR 1.4 03/15/2020       Assessment & Plan:   Problem List Items Addressed This Visit    Abnormal liver function test    Follow liver function tests.        Anemia    Follow cbc.       Health care maintenance    Physical today 03/16/20.  PAP 09/20/17 - negative with negative HPV.  Colonoscopy 10/2017 - 72m polyp and diverticulosis.        History of breast cancer    Bilateral diagnostic mammogram and left breast ultrasound 11/07/19 - Birads II.  Recommended screening mammogram in one year.        Hypercholesterolemia    On crestor.  Low cholesterol diet and exercise.  Follow lipid panel and liver function tests.        Relevant Orders    Hepatic function panel   Lipid panel   Hypertension    Blood pressure as outlined.  Continue telmisartan.  Follow pressure. Follow metabolic panel.       Right shoulder pain    S/p right shoulder surgery.  Going to therapy.  Limited rom.  Doing well.  Follow.        Thrombocytopenia (HCC)    Last platelet count wnl.  Follow.        Type 2 diabetes mellitus with neurological complications (HCC)    Low carb diet and exercise.  On metformin.  Follow met b and a1c.        Relevant Orders   Hemoglobin AD2K  Basic metabolic panel    Other Visit Diagnoses    Routine general medical examination at a health care facility    -  Primary       CEinar Pheasant MD

## 2020-03-21 ENCOUNTER — Encounter: Payer: Self-pay | Admitting: Internal Medicine

## 2020-03-21 DIAGNOSIS — D696 Thrombocytopenia, unspecified: Secondary | ICD-10-CM | POA: Insufficient documentation

## 2020-03-21 NOTE — Assessment & Plan Note (Signed)
Follow liver function tests.   

## 2020-03-21 NOTE — Assessment & Plan Note (Signed)
On crestor.  Low cholesterol diet and exercise.  Follow lipid panel and liver function tests.   

## 2020-03-21 NOTE — Assessment & Plan Note (Signed)
S/p right shoulder surgery.  Going to therapy.  Limited rom.  Doing well.  Follow.

## 2020-03-21 NOTE — Assessment & Plan Note (Signed)
Last platelet count wnl.  Follow.  

## 2020-03-21 NOTE — Assessment & Plan Note (Signed)
Blood pressure as outlined.  Continue telmisartan.  Follow pressure. Follow metabolic panel.

## 2020-03-21 NOTE — Assessment & Plan Note (Signed)
Bilateral diagnostic mammogram and left breast ultrasound 11/07/19 - Birads II.  Recommended screening mammogram in one year.

## 2020-03-21 NOTE — Assessment & Plan Note (Signed)
Low carb diet and exercise.  On metformin.  Follow met b and a1c.

## 2020-03-21 NOTE — Assessment & Plan Note (Signed)
Follow cbc.  

## 2020-06-09 ENCOUNTER — Ambulatory Visit (INDEPENDENT_AMBULATORY_CARE_PROVIDER_SITE_OTHER): Payer: Medicare HMO

## 2020-06-09 VITALS — Ht 65.0 in | Wt 152.0 lb

## 2020-06-09 DIAGNOSIS — Z0000 Encounter for general adult medical examination without abnormal findings: Secondary | ICD-10-CM

## 2020-06-09 NOTE — Progress Notes (Addendum)
Subjective:   Angel Floyd is a 69 y.o. female who presents for Medicare Annual (Subsequent) preventive examination.  Review of Systems    No ROS.  Medicare Wellness Virtual Visit.    Cardiac Risk Factors include: advanced age (>58men, >85 women);diabetes mellitus;hypertension     Objective:    Today's Vitals   06/09/20 1325  Weight: 152 lb (68.9 kg)  Height: 5\' 5"  (1.651 m)   Body mass index is 25.29 kg/m.  Advanced Directives 06/09/2020 01/26/2020 08/10/2019 08/10/2019 08/06/2019 06/09/2019 04/08/2017  Does Patient Have a Medical Advance Directive? Yes Yes Yes Yes Yes Yes Yes  Type of Paramedic of [place];Living will [place];Living will Living will Living will Living will [place];Living will [place];Living will  Does patient want to make changes to medical advance directive? No - Patient declined No - Patient declined No - Patient declined No - Patient declined No - Patient declined No - Patient declined No - Patient declined  Copy of [place] in Chart? No - copy requested Yes - validated most recent copy scanned in chart (See row information) - - - No - copy requested No - copy requested  Would patient like information on creating a medical advance directive? - - - - - - No - Patient declined    Current Medications (verified) Outpatient Encounter Medications as of 06/09/2020  Medication Sig  . aspirin 81 MG tablet Take 81 mg by mouth daily.  . clopidogrel (PLAVIX) 75 MG tablet Take 1 tablet (75 mg total) by mouth daily.  . Fe Fum-FePoly-Vit C-Vit B3 (INTEGRA) 62.5-62.5-40-3 MG CAPS Take 1 capsule by mouth daily.  . fish oil-omega-3 fatty acids 1000 MG capsule Take 2 g by mouth daily.   Marland Kitchen glucose blood test strip Use as instructed to check blood sugars three times daily. Has One Touch Ultra glucometer. Dx 250.00  . HYDROcodone-acetaminophen (NORCO/VICODIN) 5-325 MG tablet  Take 1 tablet by mouth every 4 (four) hours as needed for moderate pain.  . metFORMIN (GLUCOPHAGE) 500 MG tablet TAKE 1 TABLET DAILY  . pantoprazole (PROTONIX) 40 MG tablet TAKE 1 TABLET DAILY  . rosuvastatin (CRESTOR) 10 MG tablet Take 1 tablet (10 mg total) by mouth daily.  Marland Kitchen telmisartan (MICARDIS) 20 MG tablet Take 1 tablet (20 mg total) by mouth daily.   No facility-administered encounter medications on file as of 06/09/2020.    Allergies (verified) Aggrenox [aspirin-dipyridamole er]   History: Past Medical History:  Diagnosis Date  . Anemia   . Breast cancer ([place])   . COVID-19 08/05/2019  . Diabetes mellitus ([place])   . Dyspnea    better since recovery from COVID  . GERD (gastroesophageal reflux disease)   . Heart murmur   . History of abnormal Pap smear    class III, required cryosurgery  . Hypercholesterolemia   . Hypertension   . TIA (transient ischemic attack)    2008, 2018. no deficit   Past Surgical History:  Procedure Laterality Date  . BREAST CYST ASPIRATION  1989  . COLONOSCOPY WITH PROPOFOL N/A 11/05/2017   Procedure: COLONOSCOPY WITH PROPOFOL;  Surgeon: Lollie Sails, MD;  Location[facility] ENDOSCOPY;  Service: Endoscopy;  Laterality: N/A;  . DILATION AND CURETTAGE OF UTERUS    . ESOPHAGOGASTRODUODENOSCOPY (EGD) WITH PROPOFOL N/A 11/05/2017   Procedure: ESOPHAGOGASTRODUODENOSCOPY (EGD) WITH PROPOFOL;  Surgeon: Lollie Sails, MD;  Location: [facility] ENDOSCOPY;  Service: Endoscopy;  Laterality: N/A;  .  FOOT SURGERY     morton's neuroma removed - right foot  . FOOT SURGERY  10/2009   achilles tendon release with reconstructive surgery  . GYNECOLOGIC CRYOSURGERY     class III pap  . HAND SURGERY     cyst removed - left hand  . hysteroscopy and D&C  7/09  . KNEE ARTHROSCOPY Left [id number]   removed tear & resurfaced area  . KNEE SURGERY  09/20/12   torn meniscus, (Dr Leanor Kail)  . SHOULDER ARTHROSCOPY WITH ROTATOR CUFF REPAIR Right 01/26/2020    Procedure: SHOULDER ARTHROSCOPY WITH ROTATOR CUFF REPAIR with distal clavicle resection, biceps tnotomy, subacromial decompression;  Surgeon: Lovell Sheehan, MD;  Location: [place];  Service: Orthopedics;  Laterality: Right;  Diabetic - oral meds  . TUBAL LIGATION     Family History  Problem Relation Age of Onset  . Lung cancer Father   . Alcohol abuse Father   . Cancer Father        lung  . CVA Mother   . Colon cancer Mother   . Alcohol abuse Mother   . Cancer Mother        colon  . Stroke Mother   . Colon cancer Sister   . Diabetes Maternal Grandfather    Social History   Socioeconomic History  . Marital status: Married    Spouse name: Not on file  . Number of children: 2  . Years of education: Not on file  . Highest education level: Not on file  Occupational History  . Not on file  Tobacco Use  . Smoking status: Never Smoker  . Smokeless tobacco: Never Used  Vaping Use  . Vaping Use: Never used  Substance and Sexual Activity  . Alcohol use: No    Alcohol/week: 0.0 standard drinks  . Drug use: No  . Sexual activity: Not Currently  Other Topics Concern  . Not on file  Social History Narrative  . Not on file   Social Determinants of Health   Financial Resource Strain:   . Difficulty of Paying Living Expenses:   Food Insecurity:   . Worried About Charity fundraiser in the Last Year:   . Arboriculturist in the Last Year:   Transportation Needs:   . Film/video editor (Medical):   Marland Kitchen Lack of Transportation (Non-Medical):   Physical Activity:   . Days of Exercise per Week:   . Minutes of Exercise per Session:   Stress:   . Feeling of Stress :   Social Connections: Unknown  . Frequency of Communication with Friends and Family: More than three times a week  . Frequency of Social Gatherings with Friends and Family: More than three times a week  . Attends Religious Services: Not on file  . Active Member of Clubs or Organizations: Not on file  .  Attends Archivist Meetings: Not on file  . Marital Status: Married    Tobacco Counseling Counseling given: Not Answered   Clinical Intake:  Pre-visit preparation completed: Yes        Diabetes: Yes (Followed by pcp)  How often do you need to have someone help you when you read instructions, pamphlets, or other written materials from your doctor or pharmacy?: 1 - Never   Interpreter Needed?: No      Activities of Daily Living In your present state of health, do you have any difficulty performing the following activities: 06/09/2020 01/26/2020  Hearing? N N  Vision? N N  Difficulty concentrating or making decisions? Y N  Comment Notes some difficulty with memory; hx of 2 strokes -  Walking or climbing stairs? N N  Dressing or bathing? N N  Doing errands, shopping? N -  Preparing Food and eating ? N -  Using the Toilet? N -  In the past six months, have you accidently leaked urine? N -  Do you have problems with loss of bowel control? N -  Managing your Medications? N -  Managing your Finances? N -  Housekeeping or managing your Housekeeping? N -  Some recent data might be hidden    Patient Care Team: Einar Pheasant, MD as PCP - General (Internal Medicine)  Indicate any recent Medical Services you may have received from other than [facility] providers in the past year (date may be approximate).     Assessment:   This is a routine wellness examination for Angel Floyd.  I connected with Trude today by telephone and verified that I am speaking with the correct person using two identifiers. Location patient: home Location provider: work Persons participating in the virtual visit: patient, Marine scientist.    I discussed the limitations, risks, security and privacy concerns of performing an evaluation and management service by telephone and the availability of in person appointments. The patient expressed understanding and verbally consented to this telephonic visit.      Interactive audio and video telecommunications were attempted between this provider and patient, however failed, due to patient having technical difficulties OR patient did not have access to video capability.  We continued and completed visit with audio only.  Some vital signs may be absent or patient reported.   Hearing/Vision screen  Hearing Screening   125Hz  250Hz  500Hz  1000Hz  2000Hz  3000Hz  4000Hz  6000Hz  8000Hz   Right ear:           Left ear:           Comments: Patient is able to hear conversational tones without difficulty.  No issues reported.  Vision Screening Comments: Followed by [facility], Dr.Dingledine.  Wears corrective lenses Visual acuity not assessed, virtual visit.  They have seen their ophthalmologist in the last 12 months.   Dietary issues and exercise activities discussed: Current Exercise Habits: Home exercise routine, Intensity: Mild  Healthy diet Good water intake Caffeine- none  Goals      Patient Stated   .  Increase physical activity (pt-stated)      Walk more for exercise Do more brain stimulating activities      Depression Screen PHQ 2/9 Scores 06/09/2020 06/09/2019 09/20/2017 09/20/2017 08/11/2016 08/10/2015  PHQ - 2 Score 0 0 0 0 0 0  PHQ- 9 Score - - 0 - - -    Fall Risk Fall Risk  06/09/2020 06/09/2019 09/20/2017 08/11/2016 08/10/2015  Falls in the past year? 0 0 No No No  Number falls in past yr: 0 - - - -  Injury with Fall? 0 - - - -  Follow up Falls evaluation completed - - - -   Handrails in use when climbing stairs? Yes  Home free of loose throw rugs in walkways, pet beds, electrical cords, etc? Yes  Adequate lighting in your home to reduce risk of falls? Yes   ASSISTIVE DEVICES UTILIZED TO PREVENT FALLS: Life alert? No  Use of a cane, walker or w/c? No  Grab bars in the bathroom? No  Shower chair or bench in shower? No  Elevated toilet seat or a handicapped  toilet? No   TIMED UP AND GO: Was the test performed? No . Virtual  visit.   Cognitive Function: MMSE - Mini Mental State Exam 06/09/2020  Not completed: Unable to complete     6CIT Screen 06/09/2019  What Year? 0 points  What month? 0 points  What time? 0 points  Count back from 20 0 points  Months in reverse 0 points  Repeat phrase 0 points  Total Score 0   Immunizations Immunization History  Administered Date(s) Administered  . Influenza Split 08/10/2012, 08/08/2013, 08/26/2014  . Influenza, High Dose Seasonal PF 10/01/2019  . Influenza,inj,Quad PF,6+ Mos 08/11/2016  . Influenza-Unspecified 09/02/2015, 09/06/2017, 09/04/2018  . Moderna SARS-COVID-2 Vaccination 12/21/2019, 01/18/2020  . Pneumococcal Conjugate-13 02/11/2018  . Pneumococcal Polysaccharide-23 07/07/2013  . Tdap 07/03/2017  . Zoster 09/04/2013  . Zoster Recombinat (Shingrix) 07/03/2017   Health Maintenance Health Maintenance  Topic Date Due  . FOOT EXAM  07/16/2020 (Originally 01/24/2019)  . DEXA SCAN  07/16/2020 (Originally 02/23/2017)  . PNA vac Low Risk Adult (2 of 2 - PPSV23) 07/16/2020 (Originally 02/12/2019)  . INFLUENZA VACCINE  06/20/2020  . HEMOGLOBIN A1C  09/14/2020  . OPHTHALMOLOGY EXAM  10/07/2020  . MAMMOGRAM  11/06/2021  . COLONOSCOPY  11/05/2022  . TETANUS/TDAP  07/04/2027  . COVID-19 Vaccine  Completed  . Hepatitis C Screening  Completed   Dexa Scan- deferred per patient.  Foot exam- denies wounds, numbness, tingling, any changes. Followed by pcp.   Pneumovax 23- deferred per patient.   Dental Screening: Recommended annual dental exams for proper oral hygiene  Community Resource Referral / Chronic Care Management: CRR required this visit?  No   CCM required this visit?  No      Plan:   Keep all routine maintenance appointments.  Next scheduled lab 07/14/20  Follow up 07/16/20  I have personally reviewed and noted the following in the patient's chart:   . Medical and social history . Use of alcohol, tobacco or illicit drugs  . Current  medications and supplements . Functional ability and status . Nutritional status . Physical activity . Advanced directives . List of other physicians . Hospitalizations, surgeries, and ER visits in previous 12 months . Vitals . Screenings to include cognitive, depression, and falls . Referrals and appointments  In addition, I have reviewed and discussed with patient certain preventive protocols, quality metrics, and best practice recommendations. A written personalized care plan for preventive services as well as general preventive health recommendations were provided to patient via mychart.     Varney Biles, LPN   12/12/4823    Reviewed above information.  Agree with assessment and plan.    Dr Nicki Reaper

## 2020-06-09 NOTE — Patient Instructions (Addendum)
[name] , Thank you for taking time to come for your Medicare Wellness Visit. I appreciate your ongoing commitment to your health goals. Please review the following plan we discussed and let me know if I can assist you in the future.   These are the goals we discussed: Goals      Patient Stated   .  Increase physical activity (pt-stated)      Walk more for exercise Do more brain stimulating activities       This is a list of the screening recommended for you and due dates:  Health Maintenance  Topic Date Due  . Complete foot exam   07/16/2020*  . DEXA scan (bone density measurement)  07/16/2020*  . Pneumonia vaccines (2 of 2 - PPSV23) 07/16/2020*  . Flu Shot  06/20/2020  . Hemoglobin A1C  09/14/2020  . Eye exam for diabetics  10/07/2020  . Mammogram  11/06/2021  . Colon Cancer Screening  11/05/2022  . Tetanus Vaccine  07/04/2027  . COVID-19 Vaccine  Completed  .  Hepatitis C: One time screening is recommended by Center for Disease Control  (CDC) for  adults born from [birth year] through 1964.   Completed  *Topic was postponed. The date shown is not the original due date.    Immunizations Immunization History  Administered Date(s) Administered  . Influenza Split 08/10/2012, 08/08/2013, 08/26/2014  . Influenza, High Dose Seasonal PF 10/01/2019  . Influenza,inj,Quad PF,6+ Mos 08/11/2016  . Influenza-Unspecified 09/02/2015, 09/06/2017, 09/04/2018  . Moderna SARS-COVID-2 Vaccination 12/21/2019, 01/18/2020  . Pneumococcal Conjugate-13 02/11/2018  . Pneumococcal Polysaccharide-23 07/07/2013  . Tdap 07/03/2017  . Zoster 09/04/2013  . Zoster Recombinat (Shingrix) 07/03/2017   Keep all routine maintenance appointments.   Next scheduled lab 07/14/20   Follow up 07/16/20  Advanced directives: End of life planning; Advance aging; Advanced directives discussed.  Copy of current HCPOA/Living Will requested.    Conditions/risks identified: none new  Follow up in one year for your  annual wellness visit   Preventive Care 65 Years and Older, Female Preventive care refers to lifestyle choices and visits with your health care provider that can promote health and wellness. What does preventive care include?  A yearly physical exam. This is also called an annual well check.  Dental exams once or twice a year.  Routine eye exams. Ask your health care provider how often you should have your eyes checked.  Personal lifestyle choices, including:  Daily care of your teeth and gums.  Regular physical activity.  Eating a healthy diet.  Avoiding tobacco and drug use.  Limiting alcohol use.  Practicing safe sex.  Taking low-dose aspirin every day.  Taking vitamin and mineral supplements as recommended by your health care provider. What happens during an annual well check? The services and screenings done by your health care provider during your annual well check will depend on your age, overall health, lifestyle risk factors, and family history of disease. Counseling  Your health care provider may ask you questions about your:  Alcohol use.  Tobacco use.  Drug use.  Emotional well-being.  Home and relationship well-being.  Sexual activity.  Eating habits.  History of falls.  Memory and ability to understand (cognition).  Work and work Statistician.  Reproductive health. Screening  You may have the following tests or measurements:  Height, weight, and BMI.  Blood pressure.  Lipid and cholesterol levels. These may be checked every 5 years, or more frequently if you are over 56 years old.  Skin check.  Lung cancer screening. You may have this screening every year starting at age 62 if you have a 30-pack-year history of smoking and currently smoke or have quit within the past 15 years.  Fecal occult blood test (FOBT) of the stool. You may have this test every year starting at age 5.  Flexible sigmoidoscopy or colonoscopy. You may have a  sigmoidoscopy every 5 years or a colonoscopy every 10 years starting at age 84.  Hepatitis C blood test.  Hepatitis B blood test.  Sexually transmitted disease (STD) testing.  Diabetes screening. This is done by checking your blood sugar (glucose) after you have not eaten for a while (fasting). You may have this done every 1-3 years.  Bone density scan. This is done to screen for osteoporosis. You may have this done starting at age 81.  Mammogram. This may be done every 1-2 years. Talk to your health care provider about how often you should have regular mammograms. Talk with your health care provider about your test results, treatment options, and if necessary, the need for more tests. Vaccines  Your health care provider may recommend certain vaccines, such as:  Influenza vaccine. This is recommended every year.  Tetanus, diphtheria, and acellular pertussis (Tdap, Td) vaccine. You may need a Td booster every 10 years.  Zoster vaccine. You may need this after age 30.  Pneumococcal 13-valent conjugate (PCV13) vaccine. One dose is recommended after age 78.  Pneumococcal polysaccharide (PPSV23) vaccine. One dose is recommended after age 22. Talk to your health care provider about which screenings and vaccines you need and how often you need them. This information is not intended to replace advice given to you by your health care provider. Make sure you discuss any questions you have with your health care provider. Document Released: 12/03/2015 Document Revised: 07/26/2016 Document Reviewed: 09/07/2015 Elsevier Interactive Patient Education  2017 [place] Prevention in the Home Falls can cause injuries. They can happen to people of all ages. There are many things you can do to make your home safe and to help prevent falls. What can I do on the outside of my home?  Regularly fix the edges of walkways and driveways and fix any cracks.  Remove anything that might make you  trip as you walk through a door, such as a raised step or threshold.  Trim any bushes or trees on the path to your home.  Use bright outdoor lighting.  Clear any walking paths of anything that might make someone trip, such as rocks or tools.  Regularly check to see if handrails are loose or broken. Make sure that both sides of any steps have handrails.  Any raised decks and porches should have guardrails on the edges.  Have any leaves, snow, or ice cleared regularly.  Use sand or salt on walking paths during winter.  Clean up any spills in your garage right away. This includes oil or grease spills. What can I do in the bathroom?  Use night lights.  Install grab bars by the toilet and in the tub and shower. Do not use towel bars as grab bars.  Use non-skid mats or decals in the tub or shower.  If you need to sit down in the shower, use a plastic, non-slip stool.  Keep the floor dry. Clean up any water that spills on the floor as soon as it happens.  Remove soap buildup in the tub or shower regularly.  Attach bath mats securely  with double-sided non-slip rug tape.  Do not have throw rugs and other things on the floor that can make you trip. What can I do in the bedroom?  Use night lights.  Make sure that you have a light by your bed that is easy to reach.  Do not use any sheets or blankets that are too big for your bed. They should not hang down onto the floor.  Have a firm chair that has side arms. You can use this for support while you get dressed.  Do not have throw rugs and other things on the floor that can make you trip. What can I do in the kitchen?  Clean up any spills right away.  Avoid walking on wet floors.  Keep items that you use a lot in easy-to-reach places.  If you need to reach something above you, use a strong step stool that has a grab bar.  Keep electrical cords out of the way.  Do not use floor polish or wax that makes floors slippery. If  you must use wax, use non-skid floor wax.  Do not have throw rugs and other things on the floor that can make you trip. What can I do with my stairs?  Do not leave any items on the stairs.  Make sure that there are handrails on both sides of the stairs and use them. Fix handrails that are broken or loose. Make sure that handrails are as long as the stairways.  Check any carpeting to make sure that it is firmly attached to the stairs. Fix any carpet that is loose or worn.  Avoid having throw rugs at the top or bottom of the stairs. If you do have throw rugs, attach them to the floor with carpet tape.  Make sure that you have a light switch at the top of the stairs and the bottom of the stairs. If you do not have them, ask someone to add them for you. What else can I do to help prevent falls?  Wear shoes that:  Do not have high heels.  Have rubber bottoms.  Are comfortable and fit you well.  Are closed at the toe. Do not wear sandals.  If you use a stepladder:  Make sure that it is fully opened. Do not climb a closed stepladder.  Make sure that both sides of the stepladder are locked into place.  Ask someone to hold it for you, if possible.  Clearly mark and make sure that you can see:  Any grab bars or handrails.  First and last steps.  Where the edge of each step is.  Use tools that help you move around (mobility aids) if they are needed. These include:  Canes.  Walkers.  Scooters.  Crutches.  Turn on the lights when you go into a dark area. Replace any light bulbs as soon as they burn out.  Set up your furniture so you have a clear path. Avoid moving your furniture around.  If any of your floors are uneven, fix them.  If there are any pets around you, be aware of where they are.  Review your medicines with your doctor. Some medicines can make you feel dizzy. This can increase your chance of falling. Ask your doctor what other things that you can do to  help prevent falls. This information is not intended to replace advice given to you by your health care provider. Make sure you discuss any questions you have with your health care  provider. Document Released: 09/02/2009 Document Revised: 04/13/2016 Document Reviewed: 12/11/2014 Elsevier Interactive Patient Education  2017 [employer].

## 2020-06-28 ENCOUNTER — Other Ambulatory Visit: Payer: Self-pay | Admitting: Internal Medicine

## 2020-07-14 ENCOUNTER — Other Ambulatory Visit: Payer: Self-pay

## 2020-07-14 ENCOUNTER — Other Ambulatory Visit (INDEPENDENT_AMBULATORY_CARE_PROVIDER_SITE_OTHER): Payer: Medicare HMO

## 2020-07-14 DIAGNOSIS — E7800 Pure hypercholesterolemia, unspecified: Secondary | ICD-10-CM

## 2020-07-14 DIAGNOSIS — E1149 Type 2 diabetes mellitus with other diabetic neurological complication: Secondary | ICD-10-CM | POA: Diagnosis not present

## 2020-07-14 LAB — BASIC METABOLIC PANEL
BUN: 22 mg/dL (ref 6–23)
CO2: 29 mEq/L (ref 19–32)
Calcium: 9.1 mg/dL (ref 8.4–10.5)
Chloride: 103 mEq/L (ref 96–112)
Creatinine, Ser: 0.76 mg/dL (ref 0.40–1.20)
GFR: 75.59 mL/min (ref >60.00)
Glucose, Bld: 131 mg/dL — ABNORMAL HIGH (ref 70–99)
Potassium: 4 mEq/L (ref 3.5–5.1)
Sodium: 140 mEq/L (ref 135–145)

## 2020-07-14 LAB — LIPID PANEL
Cholesterol: 156 mg/dL (ref 0–200)
HDL: 63.6 mg/dL (ref >39.00)
LDL Cholesterol: 83 mg/dL (ref 0–99)
NonHDL: 92.35
Total CHOL/HDL Ratio: 2
Triglycerides: 47 mg/dL (ref 0.0–149.0)
VLDL: 9.4 mg/dL (ref 0.0–40.0)

## 2020-07-14 LAB — HEPATIC FUNCTION PANEL
ALT: 13 U/L (ref 0–35)
AST: 14 U/L (ref 0–37)
Albumin: 4.2 g/dL (ref 3.5–5.2)
Alkaline Phosphatase: 60 U/L (ref 39–117)
Bilirubin, Direct: 0.2 mg/dL (ref 0.0–0.3)
Total Bilirubin: 0.9 mg/dL (ref 0.2–1.2)
Total Protein: 6.1 g/dL (ref 6.0–8.3)

## 2020-07-14 LAB — HEMOGLOBIN A1C: Hgb A1c MFr Bld: 6.6 % — ABNORMAL HIGH (ref 4.6–6.5)

## 2020-07-16 ENCOUNTER — Encounter: Payer: Self-pay | Admitting: Internal Medicine

## 2020-07-16 ENCOUNTER — Other Ambulatory Visit: Payer: Self-pay

## 2020-07-16 ENCOUNTER — Ambulatory Visit: Payer: Medicare HMO | Admitting: Internal Medicine

## 2020-07-16 DIAGNOSIS — I10 Essential (primary) hypertension: Secondary | ICD-10-CM

## 2020-07-16 DIAGNOSIS — E7800 Pure hypercholesterolemia, unspecified: Secondary | ICD-10-CM

## 2020-07-16 DIAGNOSIS — E1149 Type 2 diabetes mellitus with other diabetic neurological complication: Secondary | ICD-10-CM

## 2020-07-16 DIAGNOSIS — D696 Thrombocytopenia, unspecified: Secondary | ICD-10-CM | POA: Diagnosis not present

## 2020-07-16 DIAGNOSIS — D508 Other iron deficiency anemias: Secondary | ICD-10-CM

## 2020-07-16 LAB — HM DIABETES FOOT EXAM

## 2020-07-16 NOTE — Progress Notes (Signed)
Patient ID: Angel Floyd, female   DOB: 09/26/1951, 69 y.o.   MRN: [id number]   Subjective:    Patient ID: Angel Floyd, female    DOB: 06-18-1951, 69 y.o.   MRN: [id number]  HPI This visit occurred during the SARS-CoV-2 public health emergency.  Safety protocols were in place, including screening questions prior to the visit, additional usage of staff PPE, and extensive cleaning of exam room while observing appropriate contact time as indicated for disinfecting solutions.  Patient here for a scheduled follow up.  She is s/p right shoulder surgery 01/2020.  Went to physical therapy.  Tries to stay active.  No chest pain or sob.  No acid reflux or abdominal pain reported.  Handling stress.  Bowels stable.    Past Medical History:  Diagnosis Date  . Anemia   . Breast cancer ([place])   . COVID-19 08/05/2019  . Diabetes mellitus ([place])   . Dyspnea    better since recovery from COVID  . GERD (gastroesophageal reflux disease)   . Heart murmur   . History of abnormal Pap smear    class III, required cryosurgery  . Hypercholesterolemia   . Hypertension   . TIA (transient ischemic attack)    2008, 2018. no deficit   Past Surgical History:  Procedure Laterality Date  . BREAST CYST ASPIRATION  1989  . COLONOSCOPY WITH PROPOFOL N/A 11/05/2017   Procedure: COLONOSCOPY WITH PROPOFOL;  Surgeon: Lollie Sails, MD;  Location: [facility] ENDOSCOPY;  Service: Endoscopy;  Laterality: N/A;  . DILATION AND CURETTAGE OF UTERUS    . ESOPHAGOGASTRODUODENOSCOPY (EGD) WITH PROPOFOL N/A 11/05/2017   Procedure: ESOPHAGOGASTRODUODENOSCOPY (EGD) WITH PROPOFOL;  Surgeon: Lollie Sails, MD;  Location: [facility] ENDOSCOPY;  Service: Endoscopy;  Laterality: N/A;  . FOOT SURGERY     morton's neuroma removed - right foot  . FOOT SURGERY  10/2009   achilles tendon release with reconstructive surgery  . GYNECOLOGIC CRYOSURGERY     class III pap  . HAND SURGERY     cyst removed - left hand  . hysteroscopy and D&C   7/09  . KNEE ARTHROSCOPY Left [id number]   removed tear & resurfaced area  . KNEE SURGERY  09/20/12   torn meniscus, (Dr Leanor Kail)  . SHOULDER ARTHROSCOPY WITH ROTATOR CUFF REPAIR Right 01/26/2020   Procedure: SHOULDER ARTHROSCOPY WITH ROTATOR CUFF REPAIR with distal clavicle resection, biceps tnotomy, subacromial decompression;  Surgeon: Lovell Sheehan, MD;  Location: [place];  Service: Orthopedics;  Laterality: Right;  Diabetic - oral meds  . TUBAL LIGATION     Family History  Problem Relation Age of Onset  . Lung cancer Father   . Alcohol abuse Father   . Cancer Father        lung  . CVA Mother   . Colon cancer Mother   . Alcohol abuse Mother   . Cancer Mother        colon  . Stroke Mother   . Colon cancer Sister   . Diabetes Maternal Grandfather    Social History   Socioeconomic History  . Marital status: Married    Spouse name: Not on file  . Number of children: 2  . Years of education: Not on file  . Highest education level: Not on file  Occupational History  . Not on file  Tobacco Use  . Smoking status: Never Smoker  . Smokeless tobacco: Never Used  Vaping Use  . Vaping Use: Never used  Substance  and Sexual Activity  . Alcohol use: No    Alcohol/week: 0.0 standard drinks  . Drug use: No  . Sexual activity: Not Currently  Other Topics Concern  . Not on file  Social History Narrative  . Not on file   Social Determinants of Health   Financial Resource Strain:   . Difficulty of Paying Living Expenses: Not on file  Food Insecurity:   . Worried About Charity fundraiser in the Last Year: Not on file  . Ran Out of Food in the Last Year: Not on file  Transportation Needs:   . Lack of Transportation (Medical): Not on file  . Lack of Transportation (Non-Medical): Not on file  Physical Activity:   . Days of Exercise per Week: Not on file  . Minutes of Exercise per Session: Not on file  Stress:   . Feeling of Stress : Not on file  Social  Connections: Unknown  . Frequency of Communication with Friends and Family: More than three times a week  . Frequency of Social Gatherings with Friends and Family: More than three times a week  . Attends Religious Services: Not on file  . Active Member of Clubs or Organizations: Not on file  . Attends Archivist Meetings: Not on file  . Marital Status: Married    Outpatient Encounter Medications as of 07/16/2020  Medication Sig  . aspirin 81 MG tablet Take 81 mg by mouth daily.  . clopidogrel (PLAVIX) 75 MG tablet Take 1 tablet (75 mg total) by mouth daily.  . Fe Fum-FePoly-Vit C-Vit B3 (INTEGRA) 62.5-62.5-40-3 MG CAPS Take 1 capsule by mouth daily.  . fish oil-omega-3 fatty acids 1000 MG capsule Take 2 g by mouth daily.   Marland Kitchen glucose blood test strip Use as instructed to check blood sugars three times daily. Has One Touch Ultra glucometer. Dx 250.00  . metFORMIN (GLUCOPHAGE) 500 MG tablet TAKE 1 TABLET DAILY  . pantoprazole (PROTONIX) 40 MG tablet TAKE 1 TABLET DAILY  . rosuvastatin (CRESTOR) 10 MG tablet Take 1 tablet (10 mg total) by mouth daily.  Marland Kitchen telmisartan (MICARDIS) 20 MG tablet Take 1 tablet (20 mg total) by mouth daily.  . [DISCONTINUED] HYDROcodone-acetaminophen (NORCO/VICODIN) 5-325 MG tablet Take 1 tablet by mouth every 4 (four) hours as needed for moderate pain.   No facility-administered encounter medications on file as of 07/16/2020.    Review of Systems  Constitutional: Negative for appetite change and unexpected weight change.  HENT: Negative for congestion.   Respiratory: Negative for cough, chest tightness and shortness of breath.   Cardiovascular: Negative for chest pain, palpitations and leg swelling.  Gastrointestinal: Negative for abdominal pain, diarrhea, nausea and vomiting.  Genitourinary: Negative for difficulty urinating and dysuria.  Musculoskeletal: Negative for joint swelling and myalgias.  Skin: Negative for color change and rash.    Neurological: Negative for dizziness, light-headedness and headaches.  Psychiatric/Behavioral: Negative for agitation and dysphoric mood.       Objective:    Physical Exam Vitals reviewed.  Constitutional:      General: She is not in acute distress.    Appearance: Normal appearance.  HENT:     Head: Normocephalic and atraumatic.     Right Ear: External ear normal.     Left Ear: External ear normal.  Eyes:     General: No scleral icterus.       Right eye: No discharge.        Left eye: No discharge.  Conjunctiva/sclera: Conjunctivae normal.  Neck:     Thyroid: No thyromegaly.  Cardiovascular:     Rate and Rhythm: Normal rate and regular rhythm.  Pulmonary:     Effort: No respiratory distress.     Breath sounds: Normal breath sounds. No wheezing.  Abdominal:     General: Bowel sounds are normal.     Palpations: Abdomen is soft.     Tenderness: There is no abdominal tenderness.  Musculoskeletal:        General: No swelling or tenderness.     Cervical back: Neck supple. No tenderness.  Lymphadenopathy:     Cervical: No cervical adenopathy.  Skin:    Findings: No erythema or rash.  Neurological:     Mental Status: She is alert.  Psychiatric:        Mood and Affect: Mood normal.        Behavior: Behavior normal.     BP 122/78   Pulse 65   Temp 98.2 F (36.8 C) (Oral)   Resp 16   Ht 5' 5" (1.651 m)   Wt 152 lb (68.9 kg)   SpO2 98%   BMI 25.29 kg/m  Wt Readings from Last 3 Encounters:  07/16/20 152 lb (68.9 kg)  06/09/20 152 lb (68.9 kg)  03/16/20 152 lb (68.9 kg)     Lab Results  Component Value Date   WBC 6.5 03/15/2020   HGB 13.7 03/15/2020   HCT 40.2 03/15/2020   PLT 188.0 03/15/2020   GLUCOSE 131 (H) 07/14/2020   CHOL 156 07/14/2020   TRIG 47.0 07/14/2020   HDL 63.60 07/14/2020   LDLCALC 83 07/14/2020   ALT 13 07/14/2020   AST 14 07/14/2020   NA 140 07/14/2020   K 4.0 07/14/2020   CL 103 07/14/2020   CREATININE 0.76 07/14/2020   BUN  22 07/14/2020   CO2 29 07/14/2020   TSH 1.485 09/24/2019   INR 0.95 04/08/2017   HGBA1C 6.6 (H) 07/14/2020   MICROALBUR 1.4 03/15/2020       Assessment & Plan:   Problem List Items Addressed This Visit    Type 2 diabetes mellitus with neurological complications ([place])    Low carb diet and exercise.  Follow met b and a1c.  On metformin.        Relevant Orders   Hemoglobin M5Y   Basic metabolic panel   Thrombocytopenia (H[facility])    Follow cbc.       Hypertension    Blood pressure doing well.  Continue micardis.  Follow pressures.  Follow metabolic panel.       Relevant Orders   TSH   Hypercholesterolemia    On crestor.  Low cholesterol diet and exercise.  Follow lipid panel and liver function tests.        Relevant Orders   Hepatic function panel   Lipid panel   Anemia    Follow cbc.           Einar Pheasant, MD

## 2020-07-25 ENCOUNTER — Encounter: Payer: Self-pay | Admitting: Internal Medicine

## 2020-07-25 NOTE — Assessment & Plan Note (Signed)
On crestor.  Low cholesterol diet and exercise.  Follow lipid panel and liver function tests.   

## 2020-07-25 NOTE — Assessment & Plan Note (Signed)
Follow cbc.  

## 2020-07-25 NOTE — Assessment & Plan Note (Signed)
Blood pressure doing well.  Continue micardis.  Follow pressures.  Follow metabolic panel.

## 2020-07-25 NOTE — Assessment & Plan Note (Signed)
Low carb diet and exercise.  Follow met b and a1c.  On metformin.   

## 2020-08-15 ENCOUNTER — Encounter: Payer: Self-pay | Admitting: Internal Medicine

## 2020-10-21 ENCOUNTER — Telehealth: Payer: Self-pay

## 2020-10-21 NOTE — Telephone Encounter (Signed)
Patient confirmed only symptom is back pain. I have scheduled her for 12:00 tomorrow with Dr Nicki Reaper.

## 2020-10-21 NOTE — Telephone Encounter (Signed)
Pt's spouse said that Dr. Nicki Reaper would see his wife because she is having pains around her waist. I offered to get her in today with Dr. Olivia Mackie and she said she wanted appointment with Dr. Nicki Reaper. Will you please let me know when she can get in with Dr. Nicki Reaper?

## 2020-10-21 NOTE — Telephone Encounter (Signed)
LMTCB. Need more info from patient.

## 2020-10-22 ENCOUNTER — Encounter: Payer: Self-pay | Admitting: Internal Medicine

## 2020-10-22 ENCOUNTER — Ambulatory Visit (INDEPENDENT_AMBULATORY_CARE_PROVIDER_SITE_OTHER): Payer: Medicare HMO

## 2020-10-22 ENCOUNTER — Ambulatory Visit: Payer: Medicare HMO | Admitting: Internal Medicine

## 2020-10-22 ENCOUNTER — Other Ambulatory Visit: Payer: Self-pay

## 2020-10-22 DIAGNOSIS — I10 Essential (primary) hypertension: Secondary | ICD-10-CM | POA: Diagnosis not present

## 2020-10-22 DIAGNOSIS — M5442 Lumbago with sciatica, left side: Secondary | ICD-10-CM

## 2020-10-22 DIAGNOSIS — M25559 Pain in unspecified hip: Secondary | ICD-10-CM | POA: Insufficient documentation

## 2020-10-22 DIAGNOSIS — M549 Dorsalgia, unspecified: Secondary | ICD-10-CM | POA: Insufficient documentation

## 2020-10-22 LAB — CBC WITH DIFFERENTIAL/PLATELET
Basophils Absolute: 0 10*3/uL (ref 0.0–0.1)
Basophils Relative: 0.6 % (ref 0.0–3.0)
Eosinophils Absolute: 0.2 10*3/uL (ref 0.0–0.7)
Eosinophils Relative: 2.7 % (ref 0.0–5.0)
HCT: 43.7 % (ref 36.0–46.0)
Hemoglobin: 14.8 g/dL (ref 12.0–15.0)
Lymphocytes Relative: 29.7 % (ref 12.0–46.0)
Lymphs Abs: 1.7 10*3/uL (ref 0.7–4.0)
MCHC: 33.8 g/dL (ref 30.0–36.0)
MCV: 90.1 fl (ref 78.0–100.0)
Monocytes Absolute: 0.4 10*3/uL (ref 0.1–1.0)
Monocytes Relative: 7.8 % (ref 3.0–12.0)
Neutro Abs: 3.4 10*3/uL (ref 1.4–7.7)
Neutrophils Relative %: 59.2 % (ref 43.0–77.0)
Platelets: 203 10*3/uL (ref 150.0–400.0)
RBC: 4.85 Mil/uL (ref 3.87–5.11)
RDW: 12.7 % (ref 11.5–15.5)
WBC: 5.8 10*3/uL (ref 4.0–10.5)

## 2020-10-22 LAB — BASIC METABOLIC PANEL
BUN: 15 mg/dL (ref 6–23)
CO2: 32 mEq/L (ref 19–32)
Calcium: 9.9 mg/dL (ref 8.4–10.5)
Chloride: 104 mEq/L (ref 96–112)
Creatinine, Ser: 0.93 mg/dL (ref 0.40–1.20)
GFR: 63.09 mL/min (ref >60.00)
Glucose, Bld: 103 mg/dL — ABNORMAL HIGH (ref 70–99)
Potassium: 3.9 mEq/L (ref 3.5–5.1)
Sodium: 142 mEq/L (ref 135–145)

## 2020-10-22 LAB — SEDIMENTATION RATE: Sed Rate: 2 mm/hr (ref 0–30)

## 2020-10-22 NOTE — Progress Notes (Signed)
Patient ID: Angel Floyd, female   DOB: 1951-06-21, 69 y.o.   MRN: [id number]   Subjective:    Patient ID: Angel Floyd, female    DOB: [birth year], 69 y.o.   MRN: [id number]  HPI This visit occurred during the SARS-CoV-2 public health emergency.  Safety protocols were in place, including screening questions prior to the visit, additional usage of staff PPE, and extensive cleaning of exam room while observing appropriate contact time as indicated for disinfecting solutions.  Patient here as a work in appt for mid to low back pain and bilateral groin pain.  Pain started two months ago.  Persistent.  Affecting sleeping.  Increased pain going up and down stairs.  Sanding - lower thoracic back pain.  Lifting leg - burning - groin.  Worse on left.  Affecting activity.  No chest pain or sob reported.  No acid reflux reported.  No abdominal pain or bowel change reported.    Past Medical History:  Diagnosis Date  . Anemia   . Breast cancer ([place])   . COVID-19 08/05/2019  . Diabetes mellitus ([place])   . Dyspnea    better since recovery from COVID  . GERD (gastroesophageal reflux disease)   . Heart murmur   . History of abnormal Pap smear    class III, required cryosurgery  . Hypercholesterolemia   . Hypertension   . TIA (transient ischemic attack)    2008, 2018. no deficit   Past Surgical History:  Procedure Laterality Date  . BREAST CYST ASPIRATION  1989  . COLONOSCOPY WITH PROPOFOL N/A 11/05/2017   Procedure: COLONOSCOPY WITH PROPOFOL;  Surgeon: Lollie Sails, MD;  Location: [facility] ENDOSCOPY;  Service: Endoscopy;  Laterality: N/A;  . DILATION AND CURETTAGE OF UTERUS    . ESOPHAGOGASTRODUODENOSCOPY (EGD) WITH PROPOFOL N/A 11/05/2017   Procedure: ESOPHAGOGASTRODUODENOSCOPY (EGD) WITH PROPOFOL;  Surgeon: Lollie Sails, MD;  Location: [facility] ENDOSCOPY;  Service: Endoscopy;  Laterality: N/A;  . FOOT SURGERY     morton's neuroma removed - right foot  . FOOT SURGERY  10/2009   achilles  tendon release with reconstructive surgery  . GYNECOLOGIC CRYOSURGERY     class III pap  . HAND SURGERY     cyst removed - left hand  . hysteroscopy and D&C  7/09  . KNEE ARTHROSCOPY Left [id number]   removed tear & resurfaced area  . KNEE SURGERY  09/20/12   torn meniscus, (Dr Leanor Kail)  . SHOULDER ARTHROSCOPY WITH ROTATOR CUFF REPAIR Right 01/26/2020   Procedure: SHOULDER ARTHROSCOPY WITH ROTATOR CUFF REPAIR with distal clavicle resection, biceps tnotomy, subacromial decompression;  Surgeon: Lovell Sheehan, MD;  Location: [place];  Service: Orthopedics;  Laterality: Right;  Diabetic - oral meds  . TUBAL LIGATION     Family History  Problem Relation Age of Onset  . Lung cancer Father   . Alcohol abuse Father   . Cancer Father        lung  . CVA Mother   . Colon cancer Mother   . Alcohol abuse Mother   . Cancer Mother        colon  . Stroke Mother   . Colon cancer Sister   . Diabetes Maternal Grandfather    Social History   Socioeconomic History  . Marital status: Married    Spouse name: Not on file  . Number of children: 2  . Years of education: Not on file  . Highest education level: Not on file  Occupational  History  . Not on file  Tobacco Use  . Smoking status: Never Smoker  . Smokeless tobacco: Never Used  Vaping Use  . Vaping Use: Never used  Substance and Sexual Activity  . Alcohol use: No    Alcohol/week: 0.0 standard drinks  . Drug use: No  . Sexual activity: Not Currently  Other Topics Concern  . Not on file  Social History Narrative  . Not on file   Social Determinants of Health   Financial Resource Strain:   . Difficulty of Paying Living Expenses: Not on file  Food Insecurity:   . Worried About Charity fundraiser in the Last Year: Not on file  . Ran Out of Food in the Last Year: Not on file  Transportation Needs:   . Lack of Transportation (Medical): Not on file  . Lack of Transportation (Non-Medical): Not on file  Physical  Activity:   . Days of Exercise per Week: Not on file  . Minutes of Exercise per Session: Not on file  Stress:   . Feeling of Stress : Not on file  Social Connections: Unknown  . Frequency of Communication with Friends and Family: More than three times a week  . Frequency of Social Gatherings with Friends and Family: More than three times a week  . Attends Religious Services: Not on file  . Active Member of Clubs or Organizations: Not on file  . Attends Archivist Meetings: Not on file  . Marital Status: Married    Outpatient Encounter Medications as of 10/22/2020  Medication Sig  . aspirin 81 MG tablet Take 81 mg by mouth daily.  . clopidogrel (PLAVIX) 75 MG tablet Take 1 tablet (75 mg total) by mouth daily.  . Fe Fum-FePoly-Vit C-Vit B3 (INTEGRA) 62.5-62.5-40-3 MG CAPS Take 1 capsule by mouth daily.  . fish oil-omega-3 fatty acids 1000 MG capsule Take 2 g by mouth daily.   Marland Kitchen glucose blood test strip Use as instructed to check blood sugars three times daily. Has One Touch Ultra glucometer. Dx 250.00  . metFORMIN (GLUCOPHAGE) 500 MG tablet TAKE 1 TABLET DAILY  . pantoprazole (PROTONIX) 40 MG tablet TAKE 1 TABLET DAILY  . rosuvastatin (CRESTOR) 10 MG tablet Take 1 tablet (10 mg total) by mouth daily.  Marland Kitchen telmisartan (MICARDIS) 20 MG tablet Take 1 tablet (20 mg total) by mouth daily.   No facility-administered encounter medications on file as of 10/22/2020.    Review of Systems  Constitutional: Negative for appetite change and unexpected weight change.  HENT: Negative for congestion and sinus pressure.   Respiratory: Negative for cough, chest tightness and shortness of breath.   Cardiovascular: Negative for chest pain, palpitations and leg swelling.  Gastrointestinal: Negative for abdominal pain, diarrhea, nausea and vomiting.  Genitourinary: Negative for difficulty urinating and dysuria.  Musculoskeletal: Positive for back pain.       Groin pain.   Skin: Negative for color  change and rash.  Neurological: Negative for dizziness, light-headedness and headaches.  Psychiatric/Behavioral: Negative for agitation and dysphoric mood.       Objective:    Physical Exam Vitals reviewed.  Constitutional:      General: She is not in acute distress.    Appearance: Normal appearance.  HENT:     Head: Normocephalic and atraumatic.     Right Ear: External ear normal.     Left Ear: External ear normal.  Eyes:     General:        Right  eye: No discharge.        Left eye: No discharge.     Conjunctiva/sclera: Conjunctivae normal.  Neck:     Thyroid: No thyromegaly.  Cardiovascular:     Rate and Rhythm: Normal rate and regular rhythm.  Pulmonary:     Effort: No respiratory distress.     Breath sounds: Normal breath sounds. No wheezing.  Abdominal:     General: Bowel sounds are normal.     Palpations: Abdomen is soft.     Tenderness: There is no abdominal tenderness.  Musculoskeletal:        General: No tenderness.     Cervical back: Neck supple. No tenderness.     Comments: Increased pain with SLR.  Increased pain with abduction/adduction.  Motor strength - normal and equal bilateral.   Lymphadenopathy:     Cervical: No cervical adenopathy.  Skin:    Findings: No erythema or rash.  Neurological:     Mental Status: She is alert.  Psychiatric:        Mood and Affect: Mood normal.        Behavior: Behavior normal.     BP 130/80   Pulse 81   Temp 98.1 F (36.7 C) (Oral)   Resp 14   Ht 5\' 5"  (1.651 m)   Wt 155 lb 3.2 oz (70.4 kg)   SpO2 98%   BMI 25.83 kg/m  Wt Readings from Last 3 Encounters:  10/22/20 155 lb 3.2 oz (70.4 kg)  07/16/20 152 lb (68.9 kg)  06/09/20 152 lb (68.9 kg)     Lab Results  Component Value Date   WBC 5.8 10/22/2020   HGB 14.8 10/22/2020   HCT 43.7 10/22/2020   PLT 203.0 10/22/2020   GLUCOSE 103 (H) 10/22/2020   CHOL 156 07/14/2020   TRIG 47.0 07/14/2020   HDL 63.60 07/14/2020   LDLCALC 83 07/14/2020   ALT 13  07/14/2020   AST 14 07/14/2020   NA 142 10/22/2020   K 3.9 10/22/2020   CL 104 10/22/2020   CREATININE 0.93 10/22/2020   BUN 15 10/22/2020   CO2 32 10/22/2020   TSH 1.485 09/24/2019   INR 0.95 04/08/2017   HGBA1C 6.6 (H) 07/14/2020   MICROALBUR 1.4 03/15/2020    No results found.     Assessment & Plan:   Problem List Items Addressed This Visit    Hypertension    Blood pressure on recheck improved.  Continue micardis.  Follow pressures.  Follow metabolic panel.       Hip pain   Relevant Orders   DG HIPS BILAT WITH PELVIS 3-4 VIEWS (Completed)   Sedimentation rate (Completed)   Back pain    Increased back and groin pain as outlined.  Tylenol prn.  Check thoracic spine xray, L-S spine xray and hip xray.  Further w/up pending results.        Relevant Orders   DG Lumbar Spine 2-3 Views (Completed)   DG Thoracic Spine 2 View (Completed)   CBC with Differential/Platelet (Completed)   Basic metabolic panel (Completed)   Sedimentation rate (Completed)       Einar Pheasant, MD

## 2020-10-23 ENCOUNTER — Encounter: Payer: Self-pay | Admitting: Internal Medicine

## 2020-10-23 NOTE — Assessment & Plan Note (Signed)
Increased back and groin pain as outlined.  Tylenol prn.  Check thoracic spine xray, L-S spine xray and hip xray.  Further w/up pending results.

## 2020-10-23 NOTE — Assessment & Plan Note (Signed)
Blood pressure on recheck improved.  Continue micardis.  Follow pressures.  Follow metabolic panel.

## 2020-11-05 LAB — HM MAMMOGRAPHY

## 2020-11-15 ENCOUNTER — Other Ambulatory Visit (INDEPENDENT_AMBULATORY_CARE_PROVIDER_SITE_OTHER): Payer: Medicare HMO

## 2020-11-15 ENCOUNTER — Other Ambulatory Visit: Payer: Self-pay

## 2020-11-15 DIAGNOSIS — E7800 Pure hypercholesterolemia, unspecified: Secondary | ICD-10-CM

## 2020-11-15 DIAGNOSIS — I10 Essential (primary) hypertension: Secondary | ICD-10-CM | POA: Diagnosis not present

## 2020-11-15 DIAGNOSIS — E1149 Type 2 diabetes mellitus with other diabetic neurological complication: Secondary | ICD-10-CM

## 2020-11-15 LAB — LIPID PANEL
Cholesterol: 155 mg/dL (ref 0–200)
HDL: 61.2 mg/dL (ref >39.00)
LDL Cholesterol: 80 mg/dL (ref 0–99)
NonHDL: 93.4
Total CHOL/HDL Ratio: 3
Triglycerides: 65 mg/dL (ref 0.0–149.0)
VLDL: 13 mg/dL (ref 0.0–40.0)

## 2020-11-15 LAB — BASIC METABOLIC PANEL
BUN: 18 mg/dL (ref 6–23)
CO2: 30 mEq/L (ref 19–32)
Calcium: 9 mg/dL (ref 8.4–10.5)
Chloride: 106 mEq/L (ref 96–112)
Creatinine, Ser: 0.79 mg/dL (ref 0.40–1.20)
GFR: 76.7 mL/min (ref >60.00)
Glucose, Bld: 130 mg/dL — ABNORMAL HIGH (ref 70–99)
Potassium: 3.8 mEq/L (ref 3.5–5.1)
Sodium: 142 mEq/L (ref 135–145)

## 2020-11-15 LAB — HEPATIC FUNCTION PANEL
ALT: 13 U/L (ref 0–35)
AST: 16 U/L (ref 0–37)
Albumin: 4.3 g/dL (ref 3.5–5.2)
Alkaline Phosphatase: 57 U/L (ref 39–117)
Bilirubin, Direct: 0.2 mg/dL (ref 0.0–0.3)
Total Bilirubin: 0.8 mg/dL (ref 0.2–1.2)
Total Protein: 6.3 g/dL (ref 6.0–8.3)

## 2020-11-15 LAB — TSH: TSH: 1.38 u[IU]/mL (ref 0.35–4.50)

## 2020-11-15 LAB — HEMOGLOBIN A1C: Hgb A1c MFr Bld: 6.7 % — ABNORMAL HIGH (ref 4.6–6.5)

## 2020-11-17 ENCOUNTER — Ambulatory Visit: Payer: Medicare HMO | Admitting: Internal Medicine

## 2020-11-22 ENCOUNTER — Other Ambulatory Visit: Payer: Self-pay

## 2020-11-22 ENCOUNTER — Ambulatory Visit (INDEPENDENT_AMBULATORY_CARE_PROVIDER_SITE_OTHER): Payer: Medicare HMO | Admitting: Internal Medicine

## 2020-11-22 DIAGNOSIS — D696 Thrombocytopenia, unspecified: Secondary | ICD-10-CM

## 2020-11-22 DIAGNOSIS — G459 Transient cerebral ischemic attack, unspecified: Secondary | ICD-10-CM | POA: Diagnosis not present

## 2020-11-22 DIAGNOSIS — E1149 Type 2 diabetes mellitus with other diabetic neurological complication: Secondary | ICD-10-CM

## 2020-11-22 DIAGNOSIS — I10 Essential (primary) hypertension: Secondary | ICD-10-CM

## 2020-11-22 DIAGNOSIS — D508 Other iron deficiency anemias: Secondary | ICD-10-CM | POA: Diagnosis not present

## 2020-11-22 DIAGNOSIS — Z853 Personal history of malignant neoplasm of breast: Secondary | ICD-10-CM

## 2020-11-22 DIAGNOSIS — E7800 Pure hypercholesterolemia, unspecified: Secondary | ICD-10-CM

## 2020-11-22 DIAGNOSIS — M545 Low back pain, unspecified: Secondary | ICD-10-CM

## 2020-11-22 LAB — CBC WITH DIFFERENTIAL/PLATELET
Basophils Absolute: 0 10*3/uL (ref 0.0–0.1)
Basophils Relative: 0.5 % (ref 0.0–3.0)
Eosinophils Absolute: 0.2 10*3/uL (ref 0.0–0.7)
Eosinophils Relative: 3.1 % (ref 0.0–5.0)
HCT: 41.3 % (ref 36.0–46.0)
Hemoglobin: 13.9 g/dL (ref 12.0–15.0)
Lymphocytes Relative: 29.6 % (ref 12.0–46.0)
Lymphs Abs: 1.5 10*3/uL (ref 0.7–4.0)
MCHC: 33.7 g/dL (ref 30.0–36.0)
MCV: 89.6 fl (ref 78.0–100.0)
Monocytes Absolute: 0.4 10*3/uL (ref 0.1–1.0)
Monocytes Relative: 7.1 % (ref 3.0–12.0)
Neutro Abs: 3.1 10*3/uL (ref 1.4–7.7)
Neutrophils Relative %: 59.7 % (ref 43.0–77.0)
Platelets: 181 10*3/uL (ref 150.0–400.0)
RBC: 4.61 Mil/uL (ref 3.87–5.11)
RDW: 12.9 % (ref 11.5–15.5)
WBC: 5.1 10*3/uL (ref 4.0–10.5)

## 2020-11-22 LAB — HM DIABETES FOOT EXAM

## 2020-11-22 LAB — IBC + FERRITIN
Ferritin: 131.8 ng/mL (ref 10.0–291.0)
Iron: 108 ug/dL (ref 42–145)
Saturation Ratios: 52.1 % — ABNORMAL HIGH (ref 20.0–50.0)
Transferrin: 148 mg/dL — ABNORMAL LOW (ref 212.0–360.0)

## 2020-11-22 MED ORDER — ROSUVASTATIN CALCIUM 20 MG PO TABS: 20.0000 mg | ORAL_TABLET | Freq: Every day | ORAL | 1 refills | Status: DC

## 2020-11-22 NOTE — Progress Notes (Unsigned)
Patient ID: Angel Floyd, female   DOB: Jun 20, 1951, 70 y.o.   MRN: [id number]   Subjective:    Patient ID: Angel Floyd, female    DOB: 01-Sep-1951, 70 y.o.   MRN: [id number]  HPI This visit occurred during the SARS-CoV-2 public health emergency.  Safety protocols were in place, including screening questions prior to the visit, additional usage of staff PPE, and extensive cleaning of exam room while observing appropriate contact time as indicated for disinfecting solutions.  Patient here for a scheduled follow up.  Here to follow up regarding her blood pressure, blood sugar and cholesterol.  She reports that she has been doing relatively well overall.  Stays active.  No chest pain or sob with increased activity or exertion.  No increased cough or congestion.  No acid reflux.  No abdominal pain or bowel change.  Did report that on 11/17/20, she was sitting talking with her sister and husband.  Had an episode where she could not get her words out.  Knew what she wanted to say, but hard to get out exactly what she was thinking. Had to use other words.  Also noticed her numbness in her thumb and index finger - right hand.  Episode lasted approximately 5-7 minutes.  No slurring of speech. No facial drooping.  No headache or dizziness.  Resolved.  Has not had any other issues.  Was not evaluated.  Has a history of TIA previously.  Has been evaluated by neurology.  On aspirin and plavix.  Taking regularly.  Has not skipped doses.  Felt similar to previous episodes, but she did not have the residual fatigue, etc.  Also reports still having increased left lower back/lateral hip pain and pain into her groin.  No pain radiating down her leg.  No weakness reported.  Taking her iron qod.  States stool is light in color now. No blood. No change in her bowel movements - just color change.   Past Medical History:  Diagnosis Date  . Anemia   . Breast cancer ([place])   . COVID-19 08/05/2019  . Diabetes mellitus ([place])    . Dyspnea    better since recovery from COVID  . GERD (gastroesophageal reflux disease)   . Heart murmur   . History of abnormal Pap smear    class III, required cryosurgery  . Hypercholesterolemia   . Hypertension   . TIA (transient ischemic attack)    2008, 2018. no deficit   Past Surgical History:  Procedure Laterality Date  . BREAST CYST ASPIRATION  1989  . COLONOSCOPY WITH PROPOFOL N/A 11/05/2017   Procedure: COLONOSCOPY WITH PROPOFOL;  Surgeon: Lollie Sails, MD;  Location: [facility] ENDOSCOPY;  Service: Endoscopy;  Laterality: N/A;  . DILATION AND CURETTAGE OF UTERUS    . ESOPHAGOGASTRODUODENOSCOPY (EGD) WITH PROPOFOL N/A 11/05/2017   Procedure: ESOPHAGOGASTRODUODENOSCOPY (EGD) WITH PROPOFOL;  Surgeon: Lollie Sails, MD;  Location: [facility] ENDOSCOPY;  Service: Endoscopy;  Laterality: N/A;  . FOOT SURGERY     morton's neuroma removed - right foot  . FOOT SURGERY  10/2009   achilles tendon release with reconstructive surgery  . GYNECOLOGIC CRYOSURGERY     class III pap  . HAND SURGERY     cyst removed - left hand  . hysteroscopy and D&C  7/09  . KNEE ARTHROSCOPY Left [id number]   removed tear & resurfaced area  . KNEE SURGERY  09/20/12   torn meniscus, (Dr Leanor Kail)  . SHOULDER ARTHROSCOPY WITH ROTATOR CUFF REPAIR  Right 01/26/2020   Procedure: SHOULDER ARTHROSCOPY WITH ROTATOR CUFF REPAIR with distal clavicle resection, biceps tnotomy, subacromial decompression;  Surgeon: Lovell Sheehan, MD;  Location: [place];  Service: Orthopedics;  Laterality: Right;  Diabetic - oral meds  . TUBAL LIGATION     Family History  Problem Relation Age of Onset  . Lung cancer Father   . Alcohol abuse Father   . Cancer Father        lung  . CVA Mother   . Colon cancer Mother   . Alcohol abuse Mother   . Cancer Mother        colon  . Stroke Mother   . Colon cancer Sister   . Diabetes Maternal Grandfather    Social History   Socioeconomic History  . Marital  status: Married    Spouse name: Not on file  . Number of children: 2  . Years of education: Not on file  . Highest education level: Not on file  Occupational History  . Not on file  Tobacco Use  . Smoking status: Never Smoker  . Smokeless tobacco: Never Used  Vaping Use  . Vaping Use: Never used  Substance and Sexual Activity  . Alcohol use: No    Alcohol/week: 0.0 standard drinks  . Drug use: No  . Sexual activity: Not Currently  Other Topics Concern  . Not on file  Social History Narrative  . Not on file   Social Determinants of Health   Financial Resource Strain: Not on file  Food Insecurity: Not on file  Transportation Needs: Not on file  Physical Activity: Not on file  Stress: Not on file  Social Connections: Unknown  . Frequency of Communication with Friends and Family: More than three times a week  . Frequency of Social Gatherings with Friends and Family: More than three times a week  . Attends Religious Services: Not on file  . Active Member of Clubs or Organizations: Not on file  . Attends Archivist Meetings: Not on file  . Marital Status: Married    Outpatient Encounter Medications as of 11/22/2020  Medication Sig  . aspirin 81 MG tablet Take 81 mg by mouth daily.  . clopidogrel (PLAVIX) 75 MG tablet Take 1 tablet (75 mg total) by mouth daily.  . Fe Fum-FePoly-Vit C-Vit B3 (INTEGRA) 62.5-62.5-40-3 MG CAPS Take 1 capsule by mouth daily.  . fish oil-omega-3 fatty acids 1000 MG capsule Take 2 g by mouth daily.   Marland Kitchen glucose blood test strip Use as instructed to check blood sugars three times daily. Has One Touch Ultra glucometer. Dx 250.00  . metFORMIN (GLUCOPHAGE) 500 MG tablet TAKE 1 TABLET DAILY  . pantoprazole (PROTONIX) 40 MG tablet TAKE 1 TABLET DAILY  . rosuvastatin (CRESTOR) 20 MG tablet Take 1 tablet (20 mg total) by mouth daily.  Marland Kitchen telmisartan (MICARDIS) 20 MG tablet Take 1 tablet (20 mg total) by mouth daily.  . [DISCONTINUED] rosuvastatin  (CRESTOR) 10 MG tablet Take 1 tablet (10 mg total) by mouth daily.   No facility-administered encounter medications on file as of 11/22/2020.   Review of Systems  Constitutional: Negative for appetite change and unexpected weight change.  HENT: Negative for congestion and sinus pressure.   Respiratory: Negative for cough, chest tightness and shortness of breath.   Cardiovascular: Negative for chest pain, palpitations and leg swelling.  Gastrointestinal: Negative for abdominal pain, diarrhea, nausea and vomiting.  Genitourinary: Negative for difficulty urinating and dysuria.  Musculoskeletal: Negative  for joint swelling and myalgias.  Skin: Negative for color change and rash.  Neurological: Negative for dizziness, light-headedness and headaches.       Recent finger numbness and expressive aphasia as outlined.    Psychiatric/Behavioral: Negative for agitation and dysphoric mood.       Objective:    Physical Exam Vitals reviewed.  Constitutional:      General: She is not in acute distress.    Appearance: Normal appearance.  HENT:     Head: Normocephalic and atraumatic.     Right Ear: External ear normal.     Left Ear: External ear normal.     Mouth/Throat:     Mouth: Oropharynx is clear and moist.  Eyes:     General: No scleral icterus.       Right eye: No discharge.        Left eye: No discharge.     Conjunctiva/sclera: Conjunctivae normal.  Neck:     Thyroid: No thyromegaly.  Cardiovascular:     Rate and Rhythm: Normal rate and regular rhythm.  Pulmonary:     Effort: No respiratory distress.     Breath sounds: Normal breath sounds. No wheezing.  Abdominal:     General: Bowel sounds are normal.     Palpations: Abdomen is soft.     Tenderness: There is no abdominal tenderness.  Musculoskeletal:        General: No swelling, tenderness or edema.     Cervical back: Neck supple. No tenderness.  Lymphadenopathy:     Cervical: No cervical adenopathy.  Skin:    Findings: No  erythema or rash.  Neurological:     Mental Status: She is alert.  Psychiatric:        Mood and Affect: Mood normal.        Behavior: Behavior normal.     BP 136/78   Pulse 73   Temp 97.9 F (36.6 C) (Oral)   Resp 16   Ht '5\' 5"'  (1.651 m)   Wt 155 lb 6.4 oz (70.5 kg)   SpO2 97%   BMI 25.86 kg/m  Wt Readings from Last 3 Encounters:  11/22/20 155 lb 6.4 oz (70.5 kg)  10/22/20 155 lb 3.2 oz (70.4 kg)  07/16/20 152 lb (68.9 kg)     Lab Results  Component Value Date   WBC 5.1 11/22/2020   HGB 13.9 11/22/2020   HCT 41.3 11/22/2020   PLT 181.0 11/22/2020   GLUCOSE 130 (H) 11/15/2020   CHOL 155 11/15/2020   TRIG 65.0 11/15/2020   HDL 61.20 11/15/2020   LDLCALC 80 11/15/2020   ALT 13 11/15/2020   AST 16 11/15/2020   NA 142 11/15/2020   K 3.8 11/15/2020   CL 106 11/15/2020   CREATININE 0.79 11/15/2020   BUN 18 11/15/2020   CO2 30 11/15/2020   TSH 1.38 11/15/2020   INR 0.95 04/08/2017   HGBA1C 6.7 (H) 11/15/2020   MICROALBUR 1.4 03/15/2020       Assessment & Plan:   Problem List Items Addressed This Visit    Hypertension    Blood pressure on recheck as outlined.  Continue micardis.  Hold on additional medication at this time.  Follow pressures.  Follow metabolic panel.       Relevant Medications   rosuvastatin (CRESTOR) 20 MG tablet   Hypercholesterolemia    On crestor.  LDL in the 90s.  Increase crestor to 52m q day. Follow lipid panel and liver function tests.  Low cholesterol  diet and exercise.        Relevant Medications   rosuvastatin (CRESTOR) 20 MG tablet   Other Relevant Orders   Hepatic function panel   Lipid panel   Type 2 diabetes mellitus with neurological complications ([place])    On metformin.  Low carb diet and exercise.  Follow met b and a1c.   Lab Results  Component Value Date   HGBA1C 6.7 (H) 11/15/2020        Relevant Medications   rosuvastatin (CRESTOR) 20 MG tablet   Other Relevant Orders   Hemoglobin Y5K   Basic metabolic panel    Microalbumin / creatinine urine ratio   TIA (transient ischemic attack)    Had the episode of what sounds like expressive aphasia and numbness in fingers.  Lasted approximately 5-7 minutes and resolved.  Was not evaluated.  On aspirin and plavix and takes regularly.  Has not missed doses.  Discussed further w/up and evaluation.  Will recheck carotid ultrasound.  No increased heart rate or palpitations.  Discussed recent labs.  On crestor.  LDL in 90s.  Increase crestor to 54m q day.  D/w neurology regarding further testing including MRI, heart monitor, etc, given occurrence on aspirin and plavix.  Discussed the need for immediate evaluation if any return of symptoms.        Relevant Medications   rosuvastatin (CRESTOR) 20 MG tablet   Other Relevant Orders   VAS [place]CAROTID   Anemia    On iron qod.  Recheck cbc and iron studies.        Relevant Orders   CBC with Differential/Platelet (Completed)   IBC + Ferritin (Completed)   CBC with Differential/Platelet   IBC + Ferritin   History of breast cancer    Mammogram (diagnositc) 11/05/20 - [facility] II.        Thrombocytopenia ([place]    Follow cbc.       Back pain    Low back/left lateral hip and groin pain - persistent.  Arthritis changes in lower back.  Reproducible pain with palpation - lateral hip.  Discussed arthritis/bursitis,etc.  Has been stretching.  Unable to take antiinflammatories given aspirin and plavix.  Will have ortho evaluate.  Discussed possible injection,e tc.        Relevant Orders   Ambulatory referral to Orthopedic Surgery      I spent more than 40 minutes with the patient and more than 50% of the time was spent in consultation regarding the above.  Time spent discussing previous symptoms and concerns.  Time also spent discussing further evaluation and w/up.    CEinar Pheasant MD

## 2020-11-23 ENCOUNTER — Encounter: Payer: Self-pay | Admitting: Internal Medicine

## 2020-11-23 NOTE — Assessment & Plan Note (Signed)
Blood pressure on recheck as outlined.  Continue micardis.  Hold on additional medication at this time.  Follow pressures.  Follow metabolic panel.

## 2020-11-23 NOTE — Assessment & Plan Note (Signed)
On crestor.  LDL in the 90s.  Increase crestor to 20mg  q day. Follow lipid panel and liver function tests.  Low cholesterol diet and exercise.

## 2020-11-23 NOTE — Assessment & Plan Note (Signed)
Mammogram (diagnositc) 11/05/20 - Birads II.

## 2020-11-23 NOTE — Assessment & Plan Note (Signed)
On metformin.  Low carb diet and exercise.  Follow met b and a1c.   Lab Results  Component Value Date   HGBA1C 6.7 (H) 11/15/2020

## 2020-11-23 NOTE — Assessment & Plan Note (Signed)
Had the episode of what sounds like expressive aphasia and numbness in fingers.  Lasted approximately 5-7 minutes and resolved.  Was not evaluated.  On aspirin and plavix and takes regularly.  Has not missed doses.  Discussed further w/up and evaluation.  Will recheck carotid ultrasound.  No increased heart rate or palpitations.  Discussed recent labs.  On crestor.  LDL in 90s.  Increase crestor to 20mg  q day.  D/w neurology regarding further testing including MRI, heart monitor, etc, given occurrence on aspirin and plavix.  Discussed the need for immediate evaluation if any return of symptoms.

## 2020-11-23 NOTE — Assessment & Plan Note (Signed)
On iron qod.  Recheck cbc and iron studies.

## 2020-11-23 NOTE — Assessment & Plan Note (Signed)
Low back/left lateral hip and groin pain - persistent.  Arthritis changes in lower back.  Reproducible pain with palpation - lateral hip.  Discussed arthritis/bursitis,etc.  Has been stretching.  Unable to take antiinflammatories given aspirin and plavix.  Will have ortho evaluate.  Discussed possible injection,e tc.

## 2020-11-23 NOTE — Assessment & Plan Note (Signed)
Follow cbc.  

## 2020-11-24 ENCOUNTER — Other Ambulatory Visit: Payer: Self-pay

## 2020-11-24 ENCOUNTER — Ambulatory Visit (INDEPENDENT_AMBULATORY_CARE_PROVIDER_SITE_OTHER): Payer: Medicare HMO

## 2020-11-24 DIAGNOSIS — G459 Transient cerebral ischemic attack, unspecified: Secondary | ICD-10-CM

## 2020-11-25 ENCOUNTER — Other Ambulatory Visit: Payer: Self-pay | Admitting: Neurology

## 2020-11-25 ENCOUNTER — Other Ambulatory Visit (HOSPITAL_COMMUNITY): Payer: Self-pay | Admitting: Neurology

## 2020-11-25 DIAGNOSIS — G459 Transient cerebral ischemic attack, unspecified: Secondary | ICD-10-CM

## 2020-11-29 ENCOUNTER — Ambulatory Visit
Admission: RE | Admit: 2020-11-29 | Discharge: 2020-11-29 | Disposition: A | Discharge status: Home / Self Care | Payer: Medicare HMO | Source: Ambulatory Visit | Attending: Neurology | Admitting: Neurology

## 2020-11-29 ENCOUNTER — Other Ambulatory Visit: Payer: Self-pay

## 2020-11-29 DIAGNOSIS — G459 Transient cerebral ischemic attack, unspecified: Secondary | ICD-10-CM | POA: Insufficient documentation

## 2020-11-29 LAB — HM DIABETES EYE EXAM

## 2020-12-24 ENCOUNTER — Other Ambulatory Visit
Admission: RE | Admit: 2020-12-24 | Discharge: 2020-12-24 | Disposition: A | Discharge status: Home / Self Care | Payer: Medicare HMO | Source: Ambulatory Visit | Attending: Cardiology | Admitting: Cardiology

## 2020-12-24 ENCOUNTER — Other Ambulatory Visit: Payer: Self-pay

## 2020-12-24 DIAGNOSIS — Z01812 Encounter for preprocedural laboratory examination: Secondary | ICD-10-CM | POA: Diagnosis present

## 2020-12-24 DIAGNOSIS — Z20822 Contact with and (suspected) exposure to covid-19: Secondary | ICD-10-CM | POA: Diagnosis not present

## 2020-12-24 LAB — SARS CORONAVIRUS 2 (TAT 6-24 HRS): SARS Coronavirus 2: NEGATIVE

## 2020-12-28 ENCOUNTER — Encounter
Admission: RE | Disposition: A | Discharge status: Home / Self Care | Payer: Self-pay | Source: Home / Self Care | Attending: Cardiology

## 2020-12-28 ENCOUNTER — Encounter: Payer: Self-pay | Admitting: Cardiology

## 2020-12-28 ENCOUNTER — Ambulatory Visit
Admission: RE | Admit: 2020-12-28 | Discharge: 2020-12-28 | Disposition: A | Discharge status: Home / Self Care | Payer: Medicare HMO | Attending: Cardiology | Admitting: Cardiology

## 2020-12-28 ENCOUNTER — Other Ambulatory Visit: Payer: Self-pay

## 2020-12-28 DIAGNOSIS — R55 Syncope and collapse: Secondary | ICD-10-CM | POA: Diagnosis present

## 2020-12-28 HISTORY — PX: LOOP RECORDER INSERTION: EP1214

## 2020-12-28 LAB — GLUCOSE, CAPILLARY: Glucose-Capillary: 137 mg/dL — ABNORMAL HIGH (ref 70–99)

## 2020-12-28 SURGERY — LOOP RECORDER INSERTION
Anesthesia: LOCAL

## 2020-12-28 MED ORDER — LIDOCAINE-EPINEPHRINE (PF) 1 %-1:200000 IJ SOLN
INTRAMUSCULAR | Status: AC
  Filled 2020-12-28: qty 20

## 2020-12-28 MED ORDER — LIDOCAINE HCL (PF) 1 % IJ SOLN
INTRAMUSCULAR | Status: AC
  Filled 2020-12-28: qty 30

## 2020-12-28 SURGICAL SUPPLY — 2 items
LOOP REVEAL LINQ LNQ11 (Prosthesis & Implant Heart) ×2 IMPLANT
PACK LOOP INSERTION (CUSTOM PROCEDURE TRAY) ×2 IMPLANT

## 2020-12-28 NOTE — Discharge Instructions (Signed)
Implantable Loop Recorder Placement, Care After This sheet gives you information about how to care for yourself after your procedure. Your health care provider may also give you more specific instructions. If you have problems or questions, contact your health care provider. What can I expect after the procedure? After the procedure, it is common to have:  Soreness or discomfort near the incision.  Some swelling or bruising near the incision. Follow these instructions at home: Incision care  Follow instructions from your health care provider about how to take care of your incision. Make sure you: ? Wash your hands with soap and water before you change your bandage (dressing). If soap and water are not available, use hand sanitizer. ? Change your dressing as told by your health care provider. ? Keep your dressing dry. ? Leave stitches (sutures), skin glue, or adhesive strips in place. These skin closures may need to stay in place for 2 weeks or longer. If adhesive strip edges start to loosen and curl up, you may trim the loose edges. Do not remove adhesive strips completely unless your health care provider tells you to do that.  Check your incision area every day for signs of infection. Check for: ? Redness, swelling, or pain. ? Fluid or blood. ? Warmth. ? Pus or a bad smell.  Do not take baths, swim, or use a hot tub until your health care provider approves. Ask your health care provider if you can take showers.   Activity  Return to your normal activities as told by your health care provider. Ask your health care provider what activities are safe for you.  Do not drive for 24 hours if you were given a sedative during your procedure.   General instructions  Follow instructions from your health care provider about how to manage your implantable loop recorder and transmit the information. Learn how to activate a recording if this is necessary for your type of device.  Do not go through  a metal detection gate, and do not let someone hold a metal detector over your chest. Show your ID card.  Do not have an MRI unless you check with your health care provider first.  Take over-the-counter and prescription medicines only as told by your health care provider.  Keep all follow-up visits as told by your health care provider. This is important. Contact a health care provider if:  You have redness, swelling, or pain around your incision.  You have a fever.  You have pain that is not relieved by your pain medicine.  You have triggered your device because of fainting (syncope) or because of a heartbeat that feels like it is racing, slow, fluttering, or skipping (palpitations). Get help right away if you have:  Chest pain.  Difficulty breathing. Summary  After the procedure, it is common to have soreness or discomfort near the incision.  Change your dressing as told by your health care provider.  Follow instructions from your health care provider about how to manage your implantable loop recorder and transmit the information.  Keep all follow-up visits as told by your health care provider. This is important. This information is not intended to replace advice given to you by your health care provider. Make sure you discuss any questions you have with your health care provider. Document Revised: 12/22/2017 Document Reviewed: 12/22/2017 Elsevier Patient Education  2021 [employer].

## 2020-12-30 ENCOUNTER — Telehealth: Payer: Self-pay

## 2020-12-30 NOTE — Telephone Encounter (Signed)
Called to check on patient after recent discharge 12/28/20. Reports she is following up with Cardiology 01/04/21. Patient is also being followed by [facility], Dr. Harlow Mares. Currently assigned to physical therapy twice weekly through 01/25/21. If results are not improved at that time a cortisone injection will be administered, then MRI to follow. Nurse encouraged patient to call the office if there was anything we could do and as needed.

## 2021-01-26 ENCOUNTER — Encounter: Payer: Self-pay | Admitting: Internal Medicine

## 2021-01-26 ENCOUNTER — Encounter: Payer: Self-pay | Admitting: *Deleted

## 2021-01-26 ENCOUNTER — Telehealth: Payer: Self-pay

## 2021-01-26 MED ORDER — PANTOPRAZOLE SODIUM 40 MG PO TBEC: 40.0000 mg | DELAYED_RELEASE_TABLET | Freq: Every day | ORAL | 1 refills | Status: DC

## 2021-01-26 MED ORDER — CLOPIDOGREL BISULFATE 75 MG PO TABS: 75.0000 mg | ORAL_TABLET | Freq: Every day | ORAL | 3 refills | Status: DC

## 2021-01-26 MED ORDER — ROSUVASTATIN CALCIUM 20 MG PO TABS: 20.0000 mg | ORAL_TABLET | Freq: Every day | ORAL | 1 refills | Status: DC

## 2021-01-26 MED ORDER — METFORMIN HCL 500 MG PO TABS: 500.0000 mg | ORAL_TABLET | Freq: Every day | ORAL | 1 refills | Status: DC

## 2021-01-26 MED ORDER — TELMISARTAN 20 MG PO TABS: 20.0000 mg | ORAL_TABLET | Freq: Every day | ORAL | 3 refills | Status: DC

## 2021-01-26 NOTE — Telephone Encounter (Signed)
Pt states that [facility] sent a refill request for her meds last week but I do not see that request in system. Please send the following medications to [place]  metFORMIN (GLUCOPHAGE) 500 MG tablet  pantoprazole (PROTONIX) 40 MG tablet  rosuvastatin (CRESTOR) 20 MG tablet  telmisartan (MICARDIS) 20 MG tablet  clopidogrel (PLAVIX) 75 MG tablet

## 2021-01-28 NOTE — Telephone Encounter (Signed)
Please call Angel Floyd.  I will do whatever she prefers.  If she does prefer to get a second opinion, who does she prefer to see.

## 2021-01-31 NOTE — Telephone Encounter (Signed)
Pt scheduled with Dr Nicki Reaper to discuss.

## 2021-01-31 NOTE — Telephone Encounter (Signed)
LMTCB

## 2021-01-31 NOTE — Telephone Encounter (Signed)
Patient called and really needs to speak to Dr. Nicki Reaper. Please call [phone].

## 2021-02-01 ENCOUNTER — Telehealth (INDEPENDENT_AMBULATORY_CARE_PROVIDER_SITE_OTHER): Payer: Medicare HMO | Admitting: Internal Medicine

## 2021-02-01 ENCOUNTER — Encounter: Payer: Self-pay | Admitting: Internal Medicine

## 2021-02-01 DIAGNOSIS — I10 Essential (primary) hypertension: Secondary | ICD-10-CM | POA: Diagnosis not present

## 2021-02-01 DIAGNOSIS — E7800 Pure hypercholesterolemia, unspecified: Secondary | ICD-10-CM

## 2021-02-01 DIAGNOSIS — E1149 Type 2 diabetes mellitus with other diabetic neurological complication: Secondary | ICD-10-CM

## 2021-02-01 DIAGNOSIS — D696 Thrombocytopenia, unspecified: Secondary | ICD-10-CM | POA: Diagnosis not present

## 2021-02-01 DIAGNOSIS — Z853 Personal history of malignant neoplasm of breast: Secondary | ICD-10-CM

## 2021-02-01 DIAGNOSIS — M25559 Pain in unspecified hip: Secondary | ICD-10-CM

## 2021-02-01 NOTE — Progress Notes (Signed)
Patient ID: Angel Floyd, female   DOB: February 29, 1951, 70 y.o.   MRN: [id number]   Virtual Visit via video Note  This visit type was conducted due to national recommendations for restrictions regarding the COVID-19 pandemic (e.g. social distancing).  This format is felt to be most appropriate for this patient at this time.  All issues noted in this document were discussed and addressed.  No physical exam was performed (except for noted visual exam findings with Video Visits).   I connected with Angel Floyd by a video enabled telemedicine application and verified that I am speaking with the correct person using two identifiers. Location patient: home Location provider: work  Persons participating in the virtual visit: patient, provider  The limitations, risks, security and privacy concerns of performing an evaluation and management service by video and the availability of in person appointments have been discussed. It has also been discussed with the patient that there may be a patient responsible charge related to this service. The patient expressed understanding and agreed to proceed.   Reason for visit: work in appt  HPI: Worked up recently for presumed TIA.  Experienced transient right hand numbness and language impairment.  MRI/MRA brain - no acute intracranial process.  Chronic left external capsule lacunar insult.  Minimal microvascular ischemic changes.  Mild right P2 segment narrowing.  Carotid ultrasound 1-39% bilateral.  S/p loop recorder insertion.  ECHO - ok.  No recurring episodes.  She has been having increased hip/left groin pain.  Evaluated at [facility].  S/p cortisone injection. PT.  States felt traction worsened her symptoms.  Tylenol helps.  Increased pain - trying to get up.  Slept in chair.  Left groin pain and pain into left buttock.  Sitting on commode - hurts.  Bending - hurts.  Walking feels better.  Wanted an appt to discuss getting a second opinion regarding etiology of her  pain.  No bowel or bladder change.     ROS: See pertinent positives and negatives per HPI.  Past Medical History:  Diagnosis Date  . Anemia   . Breast cancer ([place])   . COVID-19 08/05/2019  . Diabetes mellitus ([place])   . Dyspnea    better since recovery from COVID  . GERD (gastroesophageal reflux disease)   . Heart murmur   . History of abnormal Pap smear    class III, required cryosurgery  . Hypercholesterolemia   . Hypertension   . TIA (transient ischemic attack)    2008, 2018. no deficit    Past Surgical History:  Procedure Laterality Date  . BREAST CYST ASPIRATION  1989  . COLONOSCOPY WITH PROPOFOL N/A 11/05/2017   Procedure: COLONOSCOPY WITH PROPOFOL;  Surgeon: Lollie Sails, MD;  Location: [facility] ENDOSCOPY;  Service: Endoscopy;  Laterality: N/A;  . DILATION AND CURETTAGE OF UTERUS    . ESOPHAGOGASTRODUODENOSCOPY (EGD) WITH PROPOFOL N/A 11/05/2017   Procedure: ESOPHAGOGASTRODUODENOSCOPY (EGD) WITH PROPOFOL;  Surgeon: Lollie Sails, MD;  Location: [facility] ENDOSCOPY;  Service: Endoscopy;  Laterality: N/A;  . FOOT SURGERY     morton's neuroma removed - right foot  . FOOT SURGERY  10/2009   achilles tendon release with reconstructive surgery  . GYNECOLOGIC CRYOSURGERY     class III pap  . HAND SURGERY     cyst removed - left hand  . hysteroscopy and D&C  7/09  . KNEE ARTHROSCOPY Left [id number]   removed tear & resurfaced area  . KNEE SURGERY  09/20/12   torn meniscus, (Dr Joneen Boers  [facility])  . LOOP RECORDER INSERTION N/A 12/28/2020   Procedure: LOOP RECORDER INSERTION;  Surgeon: Isaias Cowman, MD;  Location: [facility] CV LAB;  Service: Cardiovascular;  Laterality: N/A;  . SHOULDER ARTHROSCOPY WITH ROTATOR CUFF REPAIR Right 01/26/2020   Procedure: SHOULDER ARTHROSCOPY WITH ROTATOR CUFF REPAIR with distal clavicle resection, biceps tnotomy, subacromial decompression;  Surgeon: Lovell Sheehan, MD;  Location: [facility];  Service: Orthopedics;  Laterality:  Right;  Diabetic - oral meds  . TUBAL LIGATION      Family History  Problem Relation Age of Onset  . Lung cancer Father   . Alcohol abuse Father   . Cancer Father        lung  . CVA Mother   . Colon cancer Mother   . Alcohol abuse Mother   . Cancer Mother        colon  . Stroke Mother   . Colon cancer Sister   . Diabetes Maternal Grandfather     SOCIAL HX: reviewed.    Current Outpatient Medications:  .  aspirin 81 MG tablet, Take 81 mg by mouth daily., Disp: , Rfl:  .  cholecalciferol (VITAMIN D) 25 MCG (1000 UNIT) tablet, Take 1,000 Units by mouth daily., Disp: , Rfl:  .  clopidogrel (PLAVIX) 75 MG tablet, Take 1 tablet (75 mg total) by mouth daily., Disp: 90 tablet, Rfl: 3 .  Fe Fum-FePoly-Vit C-Vit B3 (INTEGRA) 62.5-62.5-40-3 MG CAPS, Take 1 capsule by mouth daily. (Patient taking differently: Take 1 capsule by mouth every other day.), Disp: 90 capsule, Rfl: 1 .  fish oil-omega-3 fatty acids 1000 MG capsule, Take 2 g by mouth daily. , Disp: , Rfl:  .  glucose blood test strip, Use as instructed to check blood sugars three times daily. Has One Touch Ultra glucometer. Dx 250.00, Disp: 100 each, Rfl: 2 .  metFORMIN (GLUCOPHAGE) 500 MG tablet, Take 1 tablet (500 mg total) by mouth daily., Disp: 90 tablet, Rfl: 1 .  pantoprazole (PROTONIX) 40 MG tablet, Take 1 tablet (40 mg total) by mouth daily., Disp: 90 tablet, Rfl: 1 .  rosuvastatin (CRESTOR) 20 MG tablet, Take 1 tablet (20 mg total) by mouth daily., Disp: 90 tablet, Rfl: 1 .  telmisartan (MICARDIS) 20 MG tablet, Take 1 tablet (20 mg total) by mouth daily., Disp: 90 tablet, Rfl: 3  EXAM:  GENERAL: alert, oriented, appears well and in no acute distress  HEENT: atraumatic, conjunttiva clear, no obvious abnormalities on inspection of external nose and ears  NECK: normal movements of the head and neck  LUNGS: on inspection no signs of respiratory distress, breathing rate appears normal, no obvious gross SOB, gasping or  wheezing  CV: no obvious cyanosis  PSYCH/NEURO: pleasant and cooperative, no obvious depression or anxiety, speech and thought processing grossly intact  ASSESSMENT AND PLAN:  Discussed the following assessment and plan:  Problem List Items Addressed This Visit    Hip pain    Increased hip/groin pain as outlined.  Seeing ortho. After evaluation, ortho concerned pain coming from her back. MRI back - revealed spinal stenosis L2-3 and L1-2 and mild exiting nerve impingement L5-S1, L4-5 and L3-4. They discussed further procedures, etc.  She prefers a second opinion.  Discussed referral.        Relevant Orders   Ambulatory referral to Neurosurgery   History of breast cancer    Mammogram 11/05/20 - Birads II.       Hypercholesterolemia    On crestor.  Recently increased  to 77m q day.  Follow lipid panel and liver function tests.        Hypertension    Continue micardis.  Have her spot check her pressure.  Hold on making changes in medication.  Follow pressures.  Follow metabolic panel.       [place]    Follow cbc.       Type 2 diabetes mellitus with neurological complications (HCC)    Continue metformin.  Low carb diet and exercise.  Follow met b and a1c.   Lab Results  Component Value Date   HGBA1C 6.7 (H) 11/15/2020            I discussed the assessment and treatment plan with the patient. The patient was provided an opportunity to ask questions and all were answered. The patient agreed with the plan and demonstrated an understanding of the instructions.   The patient was advised to call back or seek an in-person evaluation if the symptoms worsen or if the condition fails to improve as anticipated.    CEinar Pheasant MD

## 2021-02-07 ENCOUNTER — Encounter: Payer: Self-pay | Admitting: Internal Medicine

## 2021-02-07 NOTE — Assessment & Plan Note (Addendum)
Mammogram 11/05/20 - Birads II.

## 2021-02-07 NOTE — Assessment & Plan Note (Signed)
On crestor.  Recently increased to 20mg  q day.  Follow lipid panel and liver function tests.

## 2021-02-07 NOTE — Assessment & Plan Note (Signed)
Follow cbc.  

## 2021-02-07 NOTE — Assessment & Plan Note (Signed)
Increased hip/groin pain as outlined.  Seeing ortho. After evaluation, ortho concerned pain coming from her back. MRI back - revealed spinal stenosis L2-3 and L1-2 and mild exiting nerve impingement L5-S1, L4-5 and L3-4. They discussed further procedures, etc.  She prefers a second opinion.  Discussed referral.

## 2021-02-07 NOTE — Assessment & Plan Note (Signed)
Continue metformin.  Low carb diet and exercise.  Follow met b and a1c.   Lab Results  Component Value Date   HGBA1C 6.7 (H) 11/15/2020

## 2021-02-07 NOTE — Assessment & Plan Note (Signed)
Continue micardis.  Have her spot check her pressure.  Hold on making changes in medication.  Follow pressures.  Follow metabolic panel.

## 2021-03-04 ENCOUNTER — Encounter: Payer: Self-pay | Admitting: Internal Medicine

## 2021-03-07 NOTE — Telephone Encounter (Signed)
Angel Floyd RECORD [facility] Patient Name: Angel Floyd Gender: Female DOB: 03-Feb-1951 Age: 70 Y 45 D Return Phone Number: [phone] (Secondary) Address: [address] City/ State/ Zip: [place]  [address] Client [facility] [place] [facility] Night - Cl Client Site [place] Physician Einar Pheasant - MD Contact Type Call Who Is Calling Patient / Member / Family / Caregiver Call Type Triage / Clinical Relationship To Patient Self Return Phone Number [phone] (Secondary) Chief Complaint Back Pain - General Reason for Call Symptomatic / Request for [name] states she was referred to a spine specialist, her appointment is not until 4/27. She is having back pain and is wondering if she can get a prescription for medication. Translation No Nurse Assessment Nurse: Tressia Danas, RN, [name] Date/Time (Eastern Time): 03/04/2021 3:57:38 PM Confirm and document reason for call. If symptomatic, describe symptoms. ---Caller states she was referred to a spine specialist, her appointment is not until 4/27. She is having back pain and is wondering if she can get a prescription for medication. caller states her pain is now a 10/10. taking Tylenol for pain. pain is radiating into her groin. Does the patient have any new or worsening symptoms? ---Yes Will a triage be completed? ---Yes Related visit to physician within the last 2 weeks? ---Yes Does the PT have any chronic conditions? (i.e. diabetes, asthma, this includes High risk factors for pregnancy, etc.) ---Yes List chronic conditions. ---TIA, spinal problem. history of shoulder surgery, diabetes, seasonal allergies, high blood pressure, stroke, skin cancer, breast cancer, knee replacement. Is this a behavioral health or substance abuse call? ---No Guidelines Guideline  Title Affirmed Question Affirmed Notes Nurse Date/Time (Eastern Time) Back Pain [1] SEVERE back pain (e.g., excruciating, unable McClarnon, RN, [facility] 03/04/2021 4:01:41 PM PLEASE NOTE: All timestamps contained within this report are represented as [place] Standard Time. CONFIDENTIALTY NOTICE: This fax transmission is intended only for the addressee. It contains information that is legally privileged, confidential or otherwise protected from use or disclosure. If you are not the intended recipient, you are strictly prohibited from reviewing, disclosing, copying using or disseminating any of this information or taking any action in reliance on or regarding this information. If you have received this fax in error, please notify us immediately by telephone so that we can arrange for its return to [place]. Phone: [phone], Toll-Free: [phone], Fax: [phone] Page: 2 of 2 Call Id: [id number] Guidelines Guideline Title Affirmed Question Affirmed Notes Nurse Date/Time [name] Time) to do any normal activities) AND [2] not improved 2 hours after pain medicine Disp. Time [name] 03/04/2021 4:04:21 PM See HCP within 4 Hours (or PCP triage) Yes McClarnon, RN, [name] Disagree/Comply Disagree Caller Understands Yes PreDisposition Did not know what to do Care Advice Given Per Guideline SEE HCP (OR PCP TRIAGE) WITHIN 4 HOURS: * IF OFFICE WILL BE CLOSED AND NO PCP (PRIMARY CARE PROVIDER) SECOND-LEVEL TRIAGE: You need to be seen within the next 3 or 4 hours. A nearby Urgent Care Center [facility]) is often a good source of care. Another choice is to go to the ED. Go sooner if you become worse. CARE ADVICE given per Back Pain (Adult) guideline. * You become worse CALL BACK IF: Comments User: Dennard Nip, RN Date/Time (Eastern Time): 03/04/2021 4:07:46 PM caller states she is on vacation she will wait until she gets home. she does not want to  get another Dr  involved with her care. She would like Dr Nicki Reaper to know about her calling today. Referrals GO TO FACILITY REFUSED

## 2021-03-07 NOTE — Telephone Encounter (Signed)
I would suggest evaluation for this. The evaluation could be in our office, though if her pain has been worsening she may want to be seen sooner at an urgent care if she is willing. Please see if we can get her in to the office this week for an appointment. I will also forward to Dr Nicki Reaper so she is aware.

## 2021-03-07 NOTE — Telephone Encounter (Signed)
Reviewed messages.  Apparently declines appt in office this week.  Please let her know that I was not in the office.  Per note, having back pain and pain radiating to groin. Please confirm if any pain radiating down leg.  Is she able to walk, etc.  Just need more specifics regarding symptoms.  Also, what is she taking now for pain.  Agree if 10/10 pain - sounds like she does need to be seen.

## 2021-03-09 NOTE — Telephone Encounter (Signed)
Spoke with patient. Using tylenol extra strength for pain. She is able to walk. Pain only radiates down her leg when she moves or bends a certain way. Pain is not 10/10 but she wanted to discuss having a muscle relaxer or something a little stronger than tylenol just to last until she sees specialist on the 27th. I have scheduled her for a virtual to discuss with Dr Nicki Reaper.

## 2021-03-10 ENCOUNTER — Telehealth (INDEPENDENT_AMBULATORY_CARE_PROVIDER_SITE_OTHER): Payer: Medicare HMO | Admitting: Internal Medicine

## 2021-03-10 ENCOUNTER — Encounter: Payer: Self-pay | Admitting: Internal Medicine

## 2021-03-10 DIAGNOSIS — Z8673 Personal history of transient ischemic attack (TIA), and cerebral infarction without residual deficits: Secondary | ICD-10-CM | POA: Diagnosis not present

## 2021-03-10 DIAGNOSIS — M25559 Pain in unspecified hip: Secondary | ICD-10-CM | POA: Diagnosis not present

## 2021-03-10 DIAGNOSIS — E1149 Type 2 diabetes mellitus with other diabetic neurological complication: Secondary | ICD-10-CM

## 2021-03-10 DIAGNOSIS — M545 Low back pain, unspecified: Secondary | ICD-10-CM

## 2021-03-10 DIAGNOSIS — I10 Essential (primary) hypertension: Secondary | ICD-10-CM

## 2021-03-10 NOTE — Progress Notes (Signed)
Patient ID: Angel Floyd, female   DOB: 05/07/1951, 70 y.o.   MRN: [id number]   Virtual Visit via video Note  This visit type was conducted due to national recommendations for restrictions regarding the COVID-19 pandemic (e.g. social distancing).  This format is felt to be most appropriate for this patient at this time.  All issues noted in this document were discussed and addressed.  No physical exam was performed (except for noted visual exam findings with Video Visits).   I connected with Angel Floyd by a video enabled telemedicine application and verified that I am speaking with the correct person using two identifiers. Location patient: home Location provider:  home office Persons participating in the virtual visit: patient, provider  The limitations, risks, security and privacy concerns of performing an evaluation and management service by video and the availability of in person appointments have been discussed.  It has also been discussed with the patient that there may be a patient responsible charge related to this service. The patient expressed understanding and agreed to proceed.   Reason for visit: work in appt  HPI: Work in to discuss her back/groin pain.  Has had persistent problems with increased left groin pain and pain into left buttock.  Has seen ortho.  Has tried PT.  Has appt with Dr Lacinda Axon 03/16/21.  Went to [employer] recently.  Increased pain while there.  Is some better now that she is home, but still with increased pain.  When she bends over, will have a hot cutting discomfort.  If she picks up her leg and takes pressure off, pain improves/resolves.  Is not sleeping well.  Described as a bruised feeling.  States feels a little swollen - left lower back (when rubbing the muscle).  Husband cannot see any actual swelling.  No leg swelling or redness.  States if she is standing or walking, pain is better.  Bending aggravates pain the most.  She is taking tylenol.  On blood  thinner.  Unable to take antiinflammatories.  She has tizanidine and hydrocodone at home and had questions about taking these medications.  No bowel or bladder incontinence or change.     ROS: See pertinent positives and negatives per HPI.  Past Medical History:  Diagnosis Date  . Anemia   . Breast cancer ([place])   . COVID-19 08/05/2019  . Diabetes mellitus ([place])   . Dyspnea    better since recovery from COVID  . GERD (gastroesophageal reflux disease)   . Heart murmur   . History of abnormal Pap smear    class III, required cryosurgery  . Hypercholesterolemia   . Hypertension   . TIA (transient ischemic attack)    2008, 2018. no deficit    Past Surgical History:  Procedure Laterality Date  . BREAST CYST ASPIRATION  1989  . COLONOSCOPY WITH PROPOFOL N/A 11/05/2017   Procedure: COLONOSCOPY WITH PROPOFOL;  Surgeon: Lollie Sails, MD;  Location: [facility] ENDOSCOPY;  Service: Endoscopy;  Laterality: N/A;  . DILATION AND CURETTAGE OF UTERUS    . ESOPHAGOGASTRODUODENOSCOPY (EGD) WITH PROPOFOL N/A 11/05/2017   Procedure: ESOPHAGOGASTRODUODENOSCOPY (EGD) WITH PROPOFOL;  Surgeon: Lollie Sails, MD;  Location: [facility] ENDOSCOPY;  Service: Endoscopy;  Laterality: N/A;  . FOOT SURGERY     morton's neuroma removed - right foot  . FOOT SURGERY  10/2009   achilles tendon release with reconstructive surgery  . GYNECOLOGIC CRYOSURGERY     class III pap  . HAND SURGERY  cyst removed - left hand  . hysteroscopy and D&C  7/09  . KNEE ARTHROSCOPY Left [id number]   removed tear & resurfaced area  . KNEE SURGERY  09/20/12   torn meniscus, (Dr Leanor Kail)  . LOOP RECORDER INSERTION N/A 12/28/2020   Procedure: LOOP RECORDER INSERTION;  Surgeon: Isaias Cowman, MD;  Location: [place] CV LAB;  Service: Cardiovascular;  Laterality: N/A;  . SHOULDER ARTHROSCOPY WITH ROTATOR CUFF REPAIR Right 01/26/2020   Procedure: SHOULDER ARTHROSCOPY WITH ROTATOR CUFF REPAIR with distal clavicle  resection, biceps tnotomy, subacromial decompression;  Surgeon: Lovell Sheehan, MD;  Location: [place];  Service: Orthopedics;  Laterality: Right;  Diabetic - oral meds  . TUBAL LIGATION      Family History  Problem Relation Age of Onset  . Lung cancer Father   . Alcohol abuse Father   . Cancer Father        lung  . CVA Mother   . Colon cancer Mother   . Alcohol abuse Mother   . Cancer Mother        colon  . Stroke Mother   . Colon cancer Sister   . Diabetes Maternal Grandfather     SOCIAL HX: reviewed.    Current Outpatient Medications:  .  aspirin 81 MG tablet, Take 81 mg by mouth daily., Disp: , Rfl:  .  cholecalciferol (VITAMIN D) 25 MCG (1000 UNIT) tablet, Take 1,000 Units by mouth daily., Disp: , Rfl:  .  clopidogrel (PLAVIX) 75 MG tablet, Take 1 tablet (75 mg total) by mouth daily., Disp: 90 tablet, Rfl: 3 .  Fe Fum-FePoly-Vit C-Vit B3 (INTEGRA) 62.5-62.5-40-3 MG CAPS, Take 1 capsule by mouth daily. (Patient taking differently: Take 1 capsule by mouth every other day.), Disp: 90 capsule, Rfl: 1 .  fish oil-omega-3 fatty acids 1000 MG capsule, Take 2 g by mouth daily. , Disp: , Rfl:  .  glucose blood test strip, Use as instructed to check blood sugars three times daily. Has One Touch Ultra glucometer. Dx 250.00, Disp: 100 each, Rfl: 2 .  metFORMIN (GLUCOPHAGE) 500 MG tablet, Take 1 tablet (500 mg total) by mouth daily., Disp: 90 tablet, Rfl: 1 .  pantoprazole (PROTONIX) 40 MG tablet, Take 1 tablet (40 mg total) by mouth daily., Disp: 90 tablet, Rfl: 1 .  rosuvastatin (CRESTOR) 20 MG tablet, Take 1 tablet (20 mg total) by mouth daily., Disp: 90 tablet, Rfl: 1 .  telmisartan (MICARDIS) 20 MG tablet, Take 1 tablet (20 mg total) by mouth daily., Disp: 90 tablet, Rfl: 3  EXAM:  GENERAL: alert, oriented, appears well and in no acute distress  HEENT: atraumatic, conjunttiva clear, no obvious abnormalities on inspection of external nose and ears  NECK: normal  movements of the head and neck  LUNGS: on inspection no signs of respiratory distress, breathing rate appears normal, no obvious gross SOB, gasping or wheezing  CV: no obvious cyanosis  PSYCH/NEURO: pleasant and cooperative, no obvious depression or anxiety, speech and thought processing grossly intact  ASSESSMENT AND PLAN:  Discussed the following assessment and plan:  Problem List Items Addressed This Visit    Back pain    Increased pain hip/groin - as outlined.  Has seen ortho.  Ortho concerned regarding back origin.  MRI back - spinal stenosis L2-3 and L1-2 and mild exiting nerve impingement L5-S1, L4-5 and L3-4.  She had requested second opinion.  Scheduled to see Dr Lacinda Axon next week.  Increased pain.  Better with standing and  walking.  Continue daily scheduled tylenol.  Avoid antiinflammatories given on plavix and aspirin.  Can use the tizanidine at night.  Notify me if persistent problems prior to her appt. Given recent TIA, want to avoid stopping plavix and aspirin.        Hip pain    Increased pain hip/groin - as outlined.  Has seen ortho.  Ortho concerned regarding back origin.  MRI back - spinal stenosis L2-3 and L1-2 and mild exiting nerve impingement L5-S1, L4-5 and L3-4.  She had requested second opinion.  Scheduled to see Dr Lacinda Axon next week.  Increased pain.  Better with standing and walking.  Continue daily scheduled tylenol.  Avoid antiinflammatories given on plavix and aspirin.  Can use the tizanidine at night.  Notify me if persistent problems prior to her appt.        History of stroke (Chronic)     recent work up:  MRI/MRA brain - no acute intracranial process.  Chronic left external capsule lacunar insult.  Minimal microvascular ischemic changes.  Mild right P2 segment narrowing.  Carotid ultrasound 1-39% bilateral.  S/p loop recorder insertion.  ECHO - ok. Continues on plavix and aspirin.  No recurring episodes.  Follow.       Hypertension    Continue micardis.  Follow  pressures.  Follow metabolic panel.       Type 2 diabetes mellitus with neurological complications (HCC)    Continue metformin.  Continue low carb diet and exercise.  Follow met b and a1c.           I discussed the assessment and treatment plan with the patient. The patient was provided an opportunity to ask questions and all were answered. The patient agreed with the plan and demonstrated an understanding of the instructions.   The patient was advised to call back or seek an in-person evaluation if the symptoms worsen or if the condition fails to improve as anticipated.    Einar Pheasant, MD

## 2021-03-13 ENCOUNTER — Encounter: Payer: Self-pay | Admitting: Internal Medicine

## 2021-03-13 NOTE — Assessment & Plan Note (Signed)
Continue metformin.  Continue low carb diet and exercise.  Follow met b and a1c.

## 2021-03-13 NOTE — Assessment & Plan Note (Signed)
Continue micardis.  Follow pressures.  Follow metabolic panel.  

## 2021-03-13 NOTE — Assessment & Plan Note (Signed)
Increased pain hip/groin - as outlined.  Has seen ortho.  Ortho concerned regarding back origin.  MRI back - spinal stenosis L2-3 and L1-2 and mild exiting nerve impingement L5-S1, L4-5 and L3-4.  She had requested second opinion.  Scheduled to see Dr Lacinda Axon next week.  Increased pain.  Better with standing and walking.  Continue daily scheduled tylenol.  Avoid antiinflammatories given on plavix and aspirin.  Can use the tizanidine at night.  Notify me if persistent problems prior to her appt.

## 2021-03-13 NOTE — Assessment & Plan Note (Signed)
recent work up:  MRI/MRA brain - no acute intracranial process.  Chronic left external capsule lacunar insult.  Minimal microvascular ischemic changes.  Mild right P2 segment narrowing.  Carotid ultrasound 1-39% bilateral.  S/p loop recorder insertion.  ECHO - ok. Continues on plavix and aspirin.  No recurring episodes.  Follow.

## 2021-03-13 NOTE — Assessment & Plan Note (Addendum)
Increased pain hip/groin - as outlined.  Has seen ortho.  Ortho concerned regarding back origin.  MRI back - spinal stenosis L2-3 and L1-2 and mild exiting nerve impingement L5-S1, L4-5 and L3-4.  She had requested second opinion.  Scheduled to see Dr Lacinda Axon next week.  Increased pain.  Better with standing and walking.  Continue daily scheduled tylenol.  Avoid antiinflammatories given on plavix and aspirin.  Can use the tizanidine at night.  Notify me if persistent problems prior to her appt. Given recent TIA, want to avoid stopping plavix and aspirin.

## 2021-03-17 ENCOUNTER — Other Ambulatory Visit (INDEPENDENT_AMBULATORY_CARE_PROVIDER_SITE_OTHER): Payer: Medicare HMO

## 2021-03-17 ENCOUNTER — Other Ambulatory Visit: Payer: Self-pay | Admitting: Neurosurgery

## 2021-03-17 ENCOUNTER — Other Ambulatory Visit: Payer: Self-pay

## 2021-03-17 DIAGNOSIS — E1149 Type 2 diabetes mellitus with other diabetic neurological complication: Secondary | ICD-10-CM | POA: Diagnosis not present

## 2021-03-17 DIAGNOSIS — R1032 Left lower quadrant pain: Secondary | ICD-10-CM

## 2021-03-17 DIAGNOSIS — E7800 Pure hypercholesterolemia, unspecified: Secondary | ICD-10-CM

## 2021-03-17 DIAGNOSIS — D508 Other iron deficiency anemias: Secondary | ICD-10-CM

## 2021-03-17 LAB — BASIC METABOLIC PANEL
BUN: 19 mg/dL (ref 6–23)
CO2: 29 mEq/L (ref 19–32)
Calcium: 9.1 mg/dL (ref 8.4–10.5)
Chloride: 106 mEq/L (ref 96–112)
Creatinine, Ser: 0.8 mg/dL (ref 0.40–1.20)
GFR: 75.37 mL/min (ref >60.00)
Glucose, Bld: 138 mg/dL — ABNORMAL HIGH (ref 70–99)
Potassium: 3.9 mEq/L (ref 3.5–5.1)
Sodium: 142 mEq/L (ref 135–145)

## 2021-03-17 LAB — LIPID PANEL
Cholesterol: 136 mg/dL (ref 0–200)
HDL: 57.3 mg/dL (ref >39.00)
LDL Cholesterol: 70 mg/dL (ref 0–99)
NonHDL: 79.14
Total CHOL/HDL Ratio: 2
Triglycerides: 46 mg/dL (ref 0.0–149.0)
VLDL: 9.2 mg/dL (ref 0.0–40.0)

## 2021-03-17 LAB — CBC WITH DIFFERENTIAL/PLATELET
Basophils Absolute: 0 10*3/uL (ref 0.0–0.1)
Basophils Relative: 0.6 % (ref 0.0–3.0)
Eosinophils Absolute: 0.2 10*3/uL (ref 0.0–0.7)
Eosinophils Relative: 3.9 % (ref 0.0–5.0)
HCT: 40 % (ref 36.0–46.0)
Hemoglobin: 13.4 g/dL (ref 12.0–15.0)
Lymphocytes Relative: 28.2 % (ref 12.0–46.0)
Lymphs Abs: 1.6 10*3/uL (ref 0.7–4.0)
MCHC: 33.5 g/dL (ref 30.0–36.0)
MCV: 91.2 fl (ref 78.0–100.0)
Monocytes Absolute: 0.4 10*3/uL (ref 0.1–1.0)
Monocytes Relative: 7.6 % (ref 3.0–12.0)
Neutro Abs: 3.4 10*3/uL (ref 1.4–7.7)
Neutrophils Relative %: 59.7 % (ref 43.0–77.0)
Platelets: 202 10*3/uL (ref 150.0–400.0)
RBC: 4.39 Mil/uL (ref 3.87–5.11)
RDW: 12.8 % (ref 11.5–15.5)
WBC: 5.7 10*3/uL (ref 4.0–10.5)

## 2021-03-17 LAB — HEPATIC FUNCTION PANEL
ALT: 15 U/L (ref 0–35)
AST: 16 U/L (ref 0–37)
Albumin: 4.3 g/dL (ref 3.5–5.2)
Alkaline Phosphatase: 59 U/L (ref 39–117)
Bilirubin, Direct: 0.1 mg/dL (ref 0.0–0.3)
Total Bilirubin: 0.5 mg/dL (ref 0.2–1.2)
Total Protein: 6.4 g/dL (ref 6.0–8.3)

## 2021-03-17 LAB — MICROALBUMIN / CREATININE URINE RATIO
Creatinine,U: 148.2 mg/dL
Microalb Creat Ratio: 1.2 mg/g (ref 0.0–30.0)
Microalb, Ur: 1.9 mg/dL (ref 0.0–1.9)

## 2021-03-17 LAB — IBC + FERRITIN
Ferritin: 112.2 ng/mL (ref 10.0–291.0)
Iron: 57 ug/dL (ref 42–145)
Saturation Ratios: 27.5 % (ref 20.0–50.0)
Transferrin: 148 mg/dL — ABNORMAL LOW (ref 212.0–360.0)

## 2021-03-17 LAB — HEMOGLOBIN A1C: Hgb A1c MFr Bld: 6.8 % — ABNORMAL HIGH (ref 4.6–6.5)

## 2021-03-22 ENCOUNTER — Ambulatory Visit (INDEPENDENT_AMBULATORY_CARE_PROVIDER_SITE_OTHER): Payer: Medicare HMO | Admitting: Internal Medicine

## 2021-03-22 ENCOUNTER — Encounter: Payer: Self-pay | Admitting: Internal Medicine

## 2021-03-22 ENCOUNTER — Other Ambulatory Visit: Payer: Self-pay

## 2021-03-22 VITALS — BP 120/72 | HR 67 | Temp 97.3°F | Resp 15 | Ht 65.0 in | Wt 163.0 lb

## 2021-03-22 DIAGNOSIS — I10 Essential (primary) hypertension: Secondary | ICD-10-CM

## 2021-03-22 DIAGNOSIS — Z0000 Encounter for general adult medical examination without abnormal findings: Secondary | ICD-10-CM

## 2021-03-22 DIAGNOSIS — R945 Abnormal results of liver function studies: Secondary | ICD-10-CM

## 2021-03-22 DIAGNOSIS — D508 Other iron deficiency anemias: Secondary | ICD-10-CM

## 2021-03-22 DIAGNOSIS — Z23 Encounter for immunization: Secondary | ICD-10-CM | POA: Diagnosis not present

## 2021-03-22 DIAGNOSIS — E7800 Pure hypercholesterolemia, unspecified: Secondary | ICD-10-CM

## 2021-03-22 DIAGNOSIS — R7989 Other specified abnormal findings of blood chemistry: Secondary | ICD-10-CM

## 2021-03-22 DIAGNOSIS — E2839 Other primary ovarian failure: Secondary | ICD-10-CM

## 2021-03-22 DIAGNOSIS — M545 Low back pain, unspecified: Secondary | ICD-10-CM

## 2021-03-22 DIAGNOSIS — Z1159 Encounter for screening for other viral diseases: Secondary | ICD-10-CM

## 2021-03-22 DIAGNOSIS — E1149 Type 2 diabetes mellitus with other diabetic neurological complication: Secondary | ICD-10-CM

## 2021-03-22 DIAGNOSIS — Z8673 Personal history of transient ischemic attack (TIA), and cerebral infarction without residual deficits: Secondary | ICD-10-CM

## 2021-03-22 DIAGNOSIS — D696 Thrombocytopenia, unspecified: Secondary | ICD-10-CM

## 2021-03-22 DIAGNOSIS — Z853 Personal history of malignant neoplasm of breast: Secondary | ICD-10-CM

## 2021-03-22 MED ORDER — TIZANIDINE HCL 4 MG PO TABS: 4.0000 mg | ORAL_TABLET | Freq: Every day | ORAL | 0 refills | Status: DC

## 2021-03-22 NOTE — Assessment & Plan Note (Signed)
Physical today 03/22/21. PAP 09/20/17 - negative with negative HPV.  Colonoscopy 10/2017 - 13mm polyp and diverticulosis.

## 2021-03-22 NOTE — Progress Notes (Signed)
Patient ID: Angel Floyd, female   DOB: Sep 11, 1951, 70 y.o.   MRN: [id number]   Subjective:    Patient ID: Angel Floyd, female    DOB: 01/07/1951, 70 y.o.   MRN: [id number]  HPI This visit occurred during the SARS-CoV-2 public health emergency.  Safety protocols were in place, including screening questions prior to the visit, additional usage of staff PPE, and extensive cleaning of exam room while observing appropriate contact time as indicated for disinfecting solutions.  Patient here for her physical exam.  Has had problems recently with increased left groin pain.  Bending forward aggravates the pain. No pain going down her leg. Has seen ortho.  Also saw Dr Lacinda Axon ([facility]).  He reviewed MRI back. No stenosis.  Recommended MRI pelvis.  Planned for this week.  Continued pain.  Request refill of tizanidine.  Seeing neurology - f/u transient right hand numbness and language impairment likely representing TIA.  MRI/MRA- - no acute intracranial process.  Sleep deprived EEG 12/23/20 - ok.  Continue aspirin and plavix.  Continue crestor.  No recurring episodes.  No headache or dizziness.  No chest pain or sob reported.  No abdominal pian.  Bowels moving.  Had ophthalmology exam 10/2020.  Discussed bone density.  Discussed hepatitis c screening.    Past Medical History:  Diagnosis Date  . Anemia   . Breast cancer ([place])   . COVID-19 08/05/2019  . Diabetes mellitus ([place])   . Dyspnea    better since recovery from COVID  . GERD (gastroesophageal reflux disease)   . Heart murmur   . History of abnormal Pap smear    class III, required cryosurgery  . Hypercholesterolemia   . Hypertension   . TIA (transient ischemic attack)    2008, 2018. no deficit   Past Surgical History:  Procedure Laterality Date  . BREAST CYST ASPIRATION  1989  . COLONOSCOPY WITH PROPOFOL N/A 11/05/2017   Procedure: COLONOSCOPY WITH PROPOFOL;  Surgeon: Lollie Sails, MD;  Location: [facility] ENDOSCOPY;  Service: Endoscopy;   Laterality: N/A;  . DILATION AND CURETTAGE OF UTERUS    . ESOPHAGOGASTRODUODENOSCOPY (EGD) WITH PROPOFOL N/A 11/05/2017   Procedure: ESOPHAGOGASTRODUODENOSCOPY (EGD) WITH PROPOFOL;  Surgeon: Lollie Sails, MD;  Location: [facility] ENDOSCOPY;  Service: Endoscopy;  Laterality: N/A;  . FOOT SURGERY     morton's neuroma removed - right foot  . FOOT SURGERY  10/2009   achilles tendon release with reconstructive surgery  . GYNECOLOGIC CRYOSURGERY     class III pap  . HAND SURGERY     cyst removed - left hand  . hysteroscopy and D&C  7/09  . KNEE ARTHROSCOPY Left [id number]   removed tear & resurfaced area  . KNEE SURGERY  09/20/12   torn meniscus, (Dr Leanor Kail)  . LOOP RECORDER INSERTION N/A 12/28/2020   Procedure: LOOP RECORDER INSERTION;  Surgeon: Isaias Cowman, MD;  Location: [place] CV LAB;  Service: Cardiovascular;  Laterality: N/A;  . SHOULDER ARTHROSCOPY WITH ROTATOR CUFF REPAIR Right 01/26/2020   Procedure: SHOULDER ARTHROSCOPY WITH ROTATOR CUFF REPAIR with distal clavicle resection, biceps tnotomy, subacromial decompression;  Surgeon: Lovell Sheehan, MD;  Location: [place];  Service: Orthopedics;  Laterality: Right;  Diabetic - oral meds  . TUBAL LIGATION     Family History  Problem Relation Age of Onset  . Lung cancer Father   . Alcohol abuse Father   . Cancer Father        lung  . CVA  Mother   . Colon cancer Mother   . Alcohol abuse Mother   . Cancer Mother        colon  . Stroke Mother   . Colon cancer Sister   . Diabetes Maternal Grandfather    Social History   Socioeconomic History  . Marital status: Married    Spouse name: Not on file  . Number of children: 2  . Years of education: Not on file  . Highest education level: Not on file  Occupational History  . Not on file  Tobacco Use  . Smoking status: Never Smoker  . Smokeless tobacco: Never Used  Vaping Use  . Vaping Use: Never used  Substance and Sexual Activity  . Alcohol use:  No    Alcohol/week: 0.0 standard drinks  . Drug use: No  . Sexual activity: Not Currently  Other Topics Concern  . Not on file  Social History Narrative  . Not on file   Social Determinants of Health   Financial Resource Strain: Not on file  Food Insecurity: Not on file  Transportation Needs: Not on file  Physical Activity: Not on file  Stress: Not on file  Social Connections: Unknown  . Frequency of Communication with Friends and Family: More than three times a week  . Frequency of Social Gatherings with Friends and Family: More than three times a week  . Attends Religious Services: Not on file  . Active Member of Clubs or Organizations: Not on file  . Attends Archivist Meetings: Not on file  . Marital Status: Married    Outpatient Encounter Medications as of 03/22/2021  Medication Sig  . aspirin 81 MG tablet Take 81 mg by mouth daily.  . cholecalciferol (VITAMIN D) 25 MCG (1000 UNIT) tablet Take 1,000 Units by mouth daily.  . clopidogrel (PLAVIX) 75 MG tablet Take 1 tablet (75 mg total) by mouth daily.  . Fe Fum-FePoly-Vit C-Vit B3 (INTEGRA) 62.5-62.5-40-3 MG CAPS Take 1 capsule by mouth daily. (Patient taking differently: Take 1 capsule by mouth every other day.)  . fish oil-omega-3 fatty acids 1000 MG capsule Take 2 g by mouth daily.   Marland Kitchen glucose blood test strip Use as instructed to check blood sugars three times daily. Has One Touch Ultra glucometer. Dx 250.00  . metFORMIN (GLUCOPHAGE) 500 MG tablet Take 1 tablet (500 mg total) by mouth daily.  . pantoprazole (PROTONIX) 40 MG tablet Take 1 tablet (40 mg total) by mouth daily.  . rosuvastatin (CRESTOR) 20 MG tablet Take 1 tablet (20 mg total) by mouth daily.  Marland Kitchen telmisartan (MICARDIS) 20 MG tablet Take 1 tablet (20 mg total) by mouth daily.  . [DISCONTINUED] tiZANidine (ZANAFLEX) 4 MG tablet Take 4 mg by mouth at bedtime.  Marland Kitchen tiZANidine (ZANAFLEX) 4 MG tablet Take 1 tablet (4 mg total) by mouth at bedtime.   No  facility-administered encounter medications on file as of 03/22/2021.    Review of Systems  Constitutional: Negative for appetite change and unexpected weight change.  HENT: Negative for congestion, sinus pressure and sore throat.   Eyes: Negative for pain and visual disturbance.  Respiratory: Negative for cough, chest tightness and shortness of breath.   Cardiovascular: Negative for chest pain, palpitations and leg swelling.  Gastrointestinal: Negative for abdominal pain, diarrhea, nausea and vomiting.  Genitourinary: Negative for difficulty urinating and dysuria.  Musculoskeletal: Negative for joint swelling and myalgias.  Skin: Negative for color change and rash.  Neurological: Negative for dizziness, light-headedness and headaches.  Hematological: Negative for adenopathy. Does not bruise/bleed easily.  Psychiatric/Behavioral: Negative for agitation and dysphoric mood.       Objective:    Physical Exam Vitals reviewed.  Constitutional:      General: She is not in acute distress.    Appearance: Normal appearance. She is well-developed.  HENT:     Head: Normocephalic and atraumatic.     Right Ear: External ear normal.     Left Ear: External ear normal.  Eyes:     General: No scleral icterus.       Right eye: No discharge.        Left eye: No discharge.     Conjunctiva/sclera: Conjunctivae normal.  Neck:     Thyroid: No thyromegaly.  Cardiovascular:     Rate and Rhythm: Normal rate and regular rhythm.  Pulmonary:     Effort: No tachypnea, accessory muscle usage or respiratory distress.     Breath sounds: Normal breath sounds. No decreased breath sounds or wheezing.  Chest:  Breasts:     Right: No inverted nipple, mass, nipple discharge or tenderness (no axillary adenopathy).     Left: No inverted nipple, mass, nipple discharge or tenderness (no axilarry adenopathy).    Abdominal:     General: Bowel sounds are normal.     Palpations: Abdomen is soft.     Tenderness:  There is no abdominal tenderness.  Musculoskeletal:        General: No swelling or tenderness.     Cervical back: Neck supple. No tenderness.  Lymphadenopathy:     Cervical: No cervical adenopathy.  Skin:    Findings: No erythema or rash.  Neurological:     Mental Status: She is alert and oriented to person, place, and time.  Psychiatric:        Mood and Affect: Mood normal.        Behavior: Behavior normal.     BP 120/72 (BP Location: Left Arm, Patient Position: Sitting, Cuff Size: Normal)   Pulse 67   Temp (!) 97.3 F (36.3 C) (Oral)   Resp 15   Ht '5\' 5"'  (1.651 m)   Wt 163 lb (73.9 kg)   SpO2 98%   BMI 27.12 kg/m  Wt Readings from Last 3 Encounters:  03/22/21 163 lb (73.9 kg)  02/01/21 161 lb (73 kg)  11/22/20 155 lb 6.4 oz (70.5 kg)     Lab Results  Component Value Date   WBC 5.7 03/17/2021   HGB 13.4 03/17/2021   HCT 40.0 03/17/2021   PLT 202.0 03/17/2021   GLUCOSE 138 (H) 03/17/2021   CHOL 136 03/17/2021   TRIG 46.0 03/17/2021   HDL 57.30 03/17/2021   LDLCALC 70 03/17/2021   ALT 15 03/17/2021   AST 16 03/17/2021   NA 142 03/17/2021   K 3.9 03/17/2021   CL 106 03/17/2021   CREATININE 0.80 03/17/2021   BUN 19 03/17/2021   CO2 29 03/17/2021   TSH 1.38 11/15/2020   INR 0.95 04/08/2017   HGBA1C 6.8 (H) 03/17/2021   MICROALBUR 1.9 03/17/2021       Assessment & Plan:   Problem List Items Addressed This Visit    Abnormal liver function test    Follow liver function tests.        Anemia    Follow cbc.       Back pain    Increased hip/groin pain.  Saw ortho.  Had MRI - L-S spine as outlined.  Saw Dr Lacinda Axon.  Recommended MRI pelvis.  Scheduled for this week.  Request refill tizanidine.        Relevant Medications   tiZANidine (ZANAFLEX) 4 MG tablet   Health care maintenance    Physical today 03/22/21. PAP 09/20/17 - negative with negative HPV.  Colonoscopy 10/2017 - 82m polyp and diverticulosis.        History of breast cancer    Mammogram  11/05/20 - birads II.       History of TIA (transient ischemic attack)    Recent episode of right hand numbness and language impairment.  Continues on aspirin and plavix.  W/up as outlined.  No changes at this time.  No recurring symptoms.       Hypercholesterolemia    Continue crestor.  Low cholesterol diet and exercise.  Follow lipid panel and liver function tests.        Relevant Orders   Hepatic function panel   Lipid panel   Hypertension    Continue micardis.  Blood pressure doing well.  Follow pressures.  Follow metabolic panel.      [place]ocyt[place]    Follow cbc.       Type 2 diabetes mellitus with neurological complications (HCC)    Continue metformin.  Low carb diet and exercise.  Follow met b and a1c.       Relevant Orders   Hemoglobin AF7C  Basic metabolic panel    Other Visit Diagnoses    Routine general medical examination at a health care facility    -  Primary   Need for hepatitis C screening test       Relevant Orders   Hepatitis C antibody (Completed)   Estrogen deficiency       Relevant Orders   DG Bone Density       CEinar Pheasant MD

## 2021-03-23 ENCOUNTER — Encounter: Payer: Self-pay | Admitting: Internal Medicine

## 2021-03-23 LAB — HEPATITIS C ANTIBODY
Hepatitis C Ab: NONREACTIVE
SIGNAL TO CUT-OFF: 0 (ref <1.00)

## 2021-03-24 ENCOUNTER — Ambulatory Visit: Admission: RE | Admit: 2021-03-24 | Payer: Medicare HMO | Source: Ambulatory Visit

## 2021-03-27 ENCOUNTER — Encounter: Payer: Self-pay | Admitting: Internal Medicine

## 2021-03-27 NOTE — Assessment & Plan Note (Signed)
Continue metformin.  Low carb diet and exercise.  Follow met b and a1c.  

## 2021-03-27 NOTE — Assessment & Plan Note (Signed)
Follow cbc.  

## 2021-03-27 NOTE — Assessment & Plan Note (Signed)
Mammogram 11/05/20 - birads II.

## 2021-03-27 NOTE — Assessment & Plan Note (Signed)
Continue crestor.  Low cholesterol diet and exercise. Follow lipid panel and liver function tests.   

## 2021-03-27 NOTE — Assessment & Plan Note (Signed)
Continue micardis.  Blood pressure doing well.  Follow pressures.  Follow metabolic panel.   

## 2021-03-27 NOTE — Assessment & Plan Note (Signed)
Increased hip/groin pain.  Saw ortho.  Had MRI - L-S spine as outlined.  Saw Dr Lacinda Axon.  Recommended MRI pelvis.  Scheduled for this week.  Request refill tizanidine.

## 2021-03-27 NOTE — Assessment & Plan Note (Signed)
Follow liver function tests.   

## 2021-03-27 NOTE — Assessment & Plan Note (Signed)
Recent episode of right hand numbness and language impairment.  Continues on aspirin and plavix.  W/up as outlined.  No changes at this time.  No recurring symptoms.

## 2021-04-08 ENCOUNTER — Other Ambulatory Visit: Payer: Self-pay

## 2021-04-08 ENCOUNTER — Ambulatory Visit
Admission: RE | Admit: 2021-04-08 | Discharge: 2021-04-08 | Disposition: A | Discharge status: Home / Self Care | Payer: Medicare HMO | Source: Ambulatory Visit | Attending: Neurosurgery | Admitting: Neurosurgery

## 2021-04-08 DIAGNOSIS — R1032 Left lower quadrant pain: Secondary | ICD-10-CM | POA: Insufficient documentation

## 2021-04-20 ENCOUNTER — Ambulatory Visit
Admission: RE | Admit: 2021-04-20 | Discharge: 2021-04-20 | Disposition: A | Discharge status: Home / Self Care | Payer: Medicare HMO | Source: Ambulatory Visit | Attending: Internal Medicine | Admitting: Internal Medicine

## 2021-04-20 ENCOUNTER — Other Ambulatory Visit: Payer: Self-pay

## 2021-04-20 DIAGNOSIS — E2839 Other primary ovarian failure: Secondary | ICD-10-CM | POA: Insufficient documentation

## 2021-04-26 ENCOUNTER — Other Ambulatory Visit: Payer: Self-pay

## 2021-04-26 ENCOUNTER — Encounter: Payer: Self-pay | Admitting: Internal Medicine

## 2021-04-26 NOTE — Telephone Encounter (Signed)
Updated med list. Called patient to discuss bone density results. LMTCB

## 2021-04-27 ENCOUNTER — Encounter: Payer: Self-pay | Admitting: Family

## 2021-04-27 ENCOUNTER — Telehealth (INDEPENDENT_AMBULATORY_CARE_PROVIDER_SITE_OTHER): Payer: Medicare HMO | Admitting: Family

## 2021-04-27 ENCOUNTER — Telehealth: Payer: Self-pay | Admitting: Internal Medicine

## 2021-04-27 VITALS — BP 141/65 | Ht 65.0 in | Wt 162.0 lb

## 2021-04-27 DIAGNOSIS — R059 Cough, unspecified: Secondary | ICD-10-CM | POA: Diagnosis not present

## 2021-04-27 MED ORDER — BENZONATATE 100 MG PO CAPS: 100.0000 mg | ORAL_CAPSULE | Freq: Three times a day (TID) | ORAL | 1 refills | Status: DC | PRN

## 2021-04-27 MED ORDER — DOXYCYCLINE HYCLATE 100 MG PO TABS: 100.0000 mg | ORAL_TABLET | Freq: Two times a day (BID) | ORAL | 0 refills | Status: DC

## 2021-04-27 NOTE — Telephone Encounter (Signed)
PT called in to request a appt with Dr.Scott. Advise she is not here for the week and got her with [facility]. She called in stating she is having trouble talking and has been coughing a lot.

## 2021-04-27 NOTE — Assessment & Plan Note (Addendum)
Duration 5 days. No acute respiratory distress. H/o covid. Mild symptoms. She is vaccinated with 1st booster. She has recently started eliquis 6 days ago. We discussed at length is this illness was covid ( she hasnt been tested) and if we would start Paxlovid in that setting. Expressed concern for drug interaction with eliquis and we would need to consult with Dr Ubaldo Glassing. And possibly hold eliquis. She will do a home covid test and call with results however she will declines treating with paxlovid. Asked her to hold  mucinex D due to elevated in blood pressure and start plain mucinex. Start tessalon and if symptoms do not improve or worsen in the next 1-2 days, she will start doxycycline.

## 2021-04-27 NOTE — Telephone Encounter (Signed)
Patient already seen through [facility] by [facility].

## 2021-04-27 NOTE — Patient Instructions (Signed)
Start tessalon for cough Plain mucinex ( not mucinex D)   If symptoms fail to improve or worsen, please start doxycycline ( antibiotic)  Ensure to take probiotics while on antibiotics and also for 2 weeks after completion. This can either be by eating yogurt daily or taking a probiotic supplement over the counter such as Culturelle.It is important to re-colonize the gut with good bacteria and also to prevent any diarrheal infections associated with antibiotic use.   Let me know of any concerns

## 2021-04-27 NOTE — Progress Notes (Signed)
Virtual Visit via Video Note  I connected with@  on 04/27/21 at  8:30 AM EDT by a video enabled telemedicine application and verified that I am speaking with the correct person using two identifiers.  Location patient: home Location provider:work  Persons participating in the virtual visit: patient, provider  I discussed the limitations of evaluation and management by telemedicine and the availability of in person appointments. The patient expressed understanding and agreed to proceed.   HPI: Cough, yellow  thick chest congestion, hoarseness x 5 days, unchanged.   Cough is most bothersome, particularly at night.   No sob, cp, wheezing,fever, diarrhea, sinus pain.  sa02 96%   Eating and drinking okay.   She has had covid vaccine with one booster  Has tried mucinex D, robitussin, nasacort without relief  No known covid exposure  H/o covid 07/2019 with hospitalization   Started eliquis 04/21/21 due to h/o CVA and atrial fib after LINQ   No recent antibiotics.   ROS: See pertinent positives and negatives per HPI.    EXAM:  VITALS per patient if applicable: BP (!) 836/62   Ht 5\' 5"  (1.651 m)   Wt 162 lb (73.5 kg)   SpO2 96%   BMI 26.96 kg/m  BP Readings from Last 3 Encounters:  04/27/21 (!) 141/65  03/22/21 120/72  12/28/20 (!) 142/64   Wt Readings from Last 3 Encounters:  04/27/21 162 lb (73.5 kg)  03/22/21 163 lb (73.9 kg)  02/01/21 161 lb (73 kg)    GENERAL: alert, oriented, appears well and in no acute distress  HEENT: atraumatic, conjunttiva clear, no obvious abnormalities on inspection of external nose and ears  NECK: normal movements of the head and neck  LUNGS: on inspection no signs of respiratory distress, breathing rate appears normal, no obvious gross SOB, gasping or wheezing  CV: no obvious cyanosis  MS: moves all visible extremities without noticeable abnormality  PSYCH/NEURO: pleasant and cooperative, no obvious depression or anxiety,  speech and thought processing grossly intact  ASSESSMENT AND PLAN:  Discussed the following assessment and plan:  Problem List Items Addressed This Visit      Other   Cough - Primary    Duration 5 days. No acute respiratory distress. H/o covid. Mild symptoms. She is vaccinated with 1st booster. She has recently started eliquis 6 days ago. We discussed at length is this illness was covid ( she hasnt been tested) and if we would start Paxlovid in that setting. Expressed concern for drug interaction with eliquis and we would need to consult with Dr Ubaldo Glassing. And possibly hold eliquis. She will do a home covid test and call with results however she will declines treating with paxlovid. Asked her to hold  mucinex D due to elevated in blood pressure and start plain mucinex. Start tessalon and if symptoms do not improve or worsen in the next 1-2 days, she will start doxycycline.        Relevant Medications   benzonatate (TESSALON) 100 MG capsule   doxycycline (VIBRA-TABS) 100 MG tablet      -we discussed possible serious and likely etiologies, options for evaluation and workup, limitations of telemedicine visit vs in person visit, treatment, treatment risks and precautions. Pt prefers to treat via telemedicine empirically rather then risking or undertaking an in person visit at this moment.  .   I discussed the assessment and treatment plan with the patient. The patient was provided an opportunity to ask questions and all were answered. The patient  agreed with the plan and demonstrated an understanding of the instructions.   The patient was advised to call back or seek an in-person evaluation if the symptoms worsen or if the condition fails to improve as anticipated.   Mable Paris, FNP

## 2021-04-29 NOTE — Telephone Encounter (Signed)
Patient aware of bone density results

## 2021-05-10 ENCOUNTER — Encounter: Payer: Self-pay | Admitting: Internal Medicine

## 2021-05-10 ENCOUNTER — Other Ambulatory Visit: Payer: Self-pay

## 2021-05-10 MED ORDER — INTEGRA 62.5-62.5-40-3 MG PO CAPS: 1.0000 | ORAL_CAPSULE | ORAL | 3 refills | Status: DC

## 2021-05-10 MED ORDER — INTEGRA 62.5-62.5-40-3 MG PO CAPS: 1.0000 | ORAL_CAPSULE | ORAL | 0 refills | Status: DC

## 2021-05-10 NOTE — Telephone Encounter (Signed)
Patient last seen 03/22/21 and labs done 02/2021.   Please advise, okay to send this in? If so what dose and I can send this on for you

## 2021-05-10 NOTE — Telephone Encounter (Signed)
Ok to send in.  The integra dose is on her med list.

## 2021-06-06 ENCOUNTER — Other Ambulatory Visit: Payer: Self-pay

## 2021-06-06 ENCOUNTER — Encounter: Payer: Self-pay | Admitting: Internal Medicine

## 2021-06-06 MED ORDER — INTEGRA 62.5-62.5-40-3 MG PO CAPS: 1.0000 | ORAL_CAPSULE | ORAL | 3 refills | Status: DC

## 2021-06-10 ENCOUNTER — Ambulatory Visit: Payer: Medicare HMO

## 2021-06-17 ENCOUNTER — Ambulatory Visit: Payer: Medicare HMO

## 2021-07-18 ENCOUNTER — Other Ambulatory Visit: Payer: Self-pay

## 2021-07-18 ENCOUNTER — Other Ambulatory Visit (INDEPENDENT_AMBULATORY_CARE_PROVIDER_SITE_OTHER): Payer: Medicare HMO

## 2021-07-18 DIAGNOSIS — E7800 Pure hypercholesterolemia, unspecified: Secondary | ICD-10-CM | POA: Diagnosis not present

## 2021-07-18 DIAGNOSIS — E1149 Type 2 diabetes mellitus with other diabetic neurological complication: Secondary | ICD-10-CM

## 2021-07-18 LAB — HEPATIC FUNCTION PANEL
ALT: 14 U/L (ref 0–35)
AST: 14 U/L (ref 0–37)
Albumin: 4.1 g/dL (ref 3.5–5.2)
Alkaline Phosphatase: 57 U/L (ref 39–117)
Bilirubin, Direct: 0.1 mg/dL (ref 0.0–0.3)
Total Bilirubin: 0.6 mg/dL (ref 0.2–1.2)
Total Protein: 6.2 g/dL (ref 6.0–8.3)

## 2021-07-18 LAB — LIPID PANEL
Cholesterol: 155 mg/dL (ref 0–200)
HDL: 62.2 mg/dL (ref >39.00)
LDL Cholesterol: 79 mg/dL (ref 0–99)
NonHDL: 92.81
Total CHOL/HDL Ratio: 2
Triglycerides: 68 mg/dL (ref 0.0–149.0)
VLDL: 13.6 mg/dL (ref 0.0–40.0)

## 2021-07-18 LAB — BASIC METABOLIC PANEL
BUN: 16 mg/dL (ref 6–23)
CO2: 29 mEq/L (ref 19–32)
Calcium: 9.3 mg/dL (ref 8.4–10.5)
Chloride: 105 mEq/L (ref 96–112)
Creatinine, Ser: 0.79 mg/dL (ref 0.40–1.20)
GFR: 76.33 mL/min (ref >60.00)
Glucose, Bld: 135 mg/dL — ABNORMAL HIGH (ref 70–99)
Potassium: 3.9 mEq/L (ref 3.5–5.1)
Sodium: 141 mEq/L (ref 135–145)

## 2021-07-18 LAB — HEMOGLOBIN A1C: Hgb A1c MFr Bld: 7 % — ABNORMAL HIGH (ref 4.6–6.5)

## 2021-07-20 ENCOUNTER — Other Ambulatory Visit: Payer: Self-pay

## 2021-07-20 ENCOUNTER — Ambulatory Visit: Payer: Medicare HMO | Admitting: Internal Medicine

## 2021-07-20 ENCOUNTER — Encounter: Payer: Self-pay | Admitting: Internal Medicine

## 2021-07-20 ENCOUNTER — Telehealth: Payer: Self-pay | Admitting: Internal Medicine

## 2021-07-20 VITALS — BP 126/78 | HR 70 | Temp 97.8°F | Ht 65.0 in | Wt 156.4 lb

## 2021-07-20 DIAGNOSIS — I10 Essential (primary) hypertension: Secondary | ICD-10-CM | POA: Diagnosis not present

## 2021-07-20 DIAGNOSIS — E7800 Pure hypercholesterolemia, unspecified: Secondary | ICD-10-CM | POA: Diagnosis not present

## 2021-07-20 DIAGNOSIS — Z8673 Personal history of transient ischemic attack (TIA), and cerebral infarction without residual deficits: Secondary | ICD-10-CM | POA: Diagnosis not present

## 2021-07-20 DIAGNOSIS — E1149 Type 2 diabetes mellitus with other diabetic neurological complication: Secondary | ICD-10-CM

## 2021-07-20 DIAGNOSIS — Z853 Personal history of malignant neoplasm of breast: Secondary | ICD-10-CM

## 2021-07-20 NOTE — Progress Notes (Signed)
Patient ID: ANEITA KIGER, female   DOB: March 09, 1951, 70 y.o.   MRN: [id number]   Subjective:    Patient ID: JANEENE SAND, female    DOB: May 07, 1951, 70 y.o.   MRN: [id number]  This visit occurred during the SARS-CoV-2 public health emergency.  Safety protocols were in place, including screening questions prior to the visit, additional usage of staff PPE, and extensive cleaning of exam room while observing appropriate contact time as indicated for disinfecting solutions.   Patient here for scheduled followup.   Chief Complaint  Patient presents with   Follow-up    Pt states she has no concerns   .   HPI Here to follow up regarding her diabetes, blood pressure and cholesterol.  She is doing well.  Staying active.  Has started walking.  No chest pain or sob reported.  No abdominal pain or bowel change reported.  Has not been watching her diet as well.  Discussed labs.  A1c elevated.  Discussed treatment changes - medication changes.  She desires to hold on changes.  Wants to work on diet and exercise.    Past Medical History:  Diagnosis Date   Anemia    Breast cancer ([place])    COVID-19 08/05/2019   Diabetes mellitus ([place])    Dyspnea    better since recovery from COVID   GERD (gastroesophageal reflux disease)    Heart murmur    History of abnormal Pap smear    class III, required cryosurgery   Hypercholesterolemia    Hypertension    TIA (transient ischemic attack)    2008, 2018. no deficit   Past Surgical History:  Procedure Laterality Date   BREAST CYST ASPIRATION  1989   COLONOSCOPY WITH PROPOFOL N/A 11/05/2017   Procedure: COLONOSCOPY WITH PROPOFOL;  Surgeon: Lollie Sails, MD;  Location: [facility] ENDOSCOPY;  Service: Endoscopy;  Laterality: N/A;   DILATION AND CURETTAGE OF UTERUS     ESOPHAGOGASTRODUODENOSCOPY (EGD) WITH PROPOFOL N/A 11/05/2017   Procedure: ESOPHAGOGASTRODUODENOSCOPY (EGD) WITH PROPOFOL;  Surgeon: Lollie Sails, MD;  Location: [facility] ENDOSCOPY;   Service: Endoscopy;  Laterality: N/A;   FOOT SURGERY     morton's neuroma removed - right foot   FOOT SURGERY  10/2009   achilles tendon release with reconstructive surgery   GYNECOLOGIC CRYOSURGERY     class III pap   HAND SURGERY     cyst removed - left hand   hysteroscopy and D&C  7/09   KNEE ARTHROSCOPY Left [id number]   removed tear & resurfaced area   KNEE SURGERY  09/20/12   torn meniscus, (Dr Leanor Kail)   [facility] N/A 12/28/2020   Procedure: LOOP RECORDER INSERTION;  Surgeon: Isaias Cowman, MD;  Location: [place] CV LAB;  Service: Cardiovascular;  Laterality: N/A;   SHOULDER ARTHROSCOPY WITH ROTATOR CUFF REPAIR Right 01/26/2020   Procedure: SHOULDER ARTHROSCOPY WITH ROTATOR CUFF REPAIR with distal clavicle resection, biceps tnotomy, subacromial decompression;  Surgeon: Lovell Sheehan, MD;  Location: [place];  Service: Orthopedics;  Laterality: Right;  Diabetic - oral meds   TUBAL LIGATION     Family History  Problem Relation Age of Onset   Lung cancer Father    Alcohol abuse Father    Cancer Father        lung   CVA Mother    Colon cancer Mother    Alcohol abuse Mother    Cancer Mother        colon   Stroke  Mother    Colon cancer Sister    Diabetes Maternal Grandfather    Social History   Socioeconomic History   Marital status: Married    Spouse name: Not on file   Number of children: 2   Years of education: Not on file   Highest education level: Not on file  Occupational History   Not on file  Tobacco Use   Smoking status: Never   Smokeless tobacco: Never  Vaping Use   Vaping Use: Never used  Substance and Sexual Activity   Alcohol use: No    Alcohol/week: 0.0 standard drinks   Drug use: No   Sexual activity: Not Currently  Other Topics Concern   Not on file  Social History Narrative   Not on file   Social Determinants of Health   Financial Resource Strain: Not on file  Food Insecurity: Not on file   Transportation Needs: Not on file  Physical Activity: Not on file  Stress: Not on file  Social Connections: Not on file    Review of Systems  Constitutional:  Negative for appetite change and unexpected weight change.  HENT:  Negative for congestion and sinus pressure.   Respiratory:  Negative for cough, chest tightness and shortness of breath.   Cardiovascular:  Negative for chest pain, palpitations and leg swelling.  Gastrointestinal:  Negative for abdominal pain, diarrhea, nausea and vomiting.  Genitourinary:  Negative for difficulty urinating and dysuria.  Musculoskeletal:  Negative for joint swelling and myalgias.  Skin:  Negative for color change and rash.  Neurological:  Negative for dizziness, light-headedness and headaches.  Psychiatric/Behavioral:  Negative for agitation and dysphoric mood.       Objective:     BP 126/78   Pulse 70   Temp 97.8 F (36.6 C)   Ht '5\' 5"'  (1.651 m)   Wt 156 lb 6.4 oz (70.9 kg)   SpO2 95%   BMI 26.03 kg/m  Wt Readings from Last 3 Encounters:  07/20/21 156 lb 6.4 oz (70.9 kg)  04/27/21 162 lb (73.5 kg)  03/22/21 163 lb (73.9 kg)    Physical Exam Vitals reviewed.  Constitutional:      General: She is not in acute distress.    Appearance: Normal appearance.  HENT:     Head: Normocephalic and atraumatic.     Right Ear: External ear normal.     Left Ear: External ear normal.  Eyes:     General: No scleral icterus.       Right eye: No discharge.        Left eye: No discharge.     Conjunctiva/sclera: Conjunctivae normal.  Neck:     Thyroid: No thyromegaly.  Cardiovascular:     Rate and Rhythm: Normal rate and regular rhythm.  Pulmonary:     Effort: No respiratory distress.     Breath sounds: Normal breath sounds. No wheezing.  Abdominal:     General: Bowel sounds are normal.     Palpations: Abdomen is soft.     Tenderness: There is no abdominal tenderness.  Musculoskeletal:        General: No swelling or tenderness.      Cervical back: Neck supple. No tenderness.  Lymphadenopathy:     Cervical: No cervical adenopathy.  Skin:    Findings: No erythema or rash.  Neurological:     Mental Status: She is alert.  Psychiatric:        Mood and Affect: Mood normal.  Behavior: Behavior normal.     Outpatient Encounter Medications as of 07/20/2021  Medication Sig   cholecalciferol (VITAMIN D) 25 MCG (1000 UNIT) tablet Take 1,000 Units by mouth daily.   ELIQUIS 5 MG TABS tablet Take 1 tablet by mouth 2 (two) times daily.   Fe Fum-FePoly-Vit C-Vit B3 (INTEGRA) 62.5-62.5-40-3 MG CAPS Take 1 capsule by mouth every other day.   fish oil-omega-3 fatty acids 1000 MG capsule Take 2 g by mouth daily.    glucose blood test strip Use as instructed to check blood sugars three times daily. Has One Touch Ultra glucometer. Dx 250.00   metFORMIN (GLUCOPHAGE) 500 MG tablet Take 1 tablet (500 mg total) by mouth daily.   pantoprazole (PROTONIX) 40 MG tablet Take 1 tablet (40 mg total) by mouth daily.   rosuvastatin (CRESTOR) 20 MG tablet Take 1 tablet (20 mg total) by mouth daily.   telmisartan (MICARDIS) 20 MG tablet Take 1 tablet (20 mg total) by mouth daily.   tiZANidine (ZANAFLEX) 4 MG tablet Take 1 tablet (4 mg total) by mouth at bedtime.   [DISCONTINUED] benzonatate (TESSALON) 100 MG capsule Take 1 capsule (100 mg total) by mouth 3 (three) times daily as needed for cough. (Patient not taking: Reported on 07/20/2021)   [DISCONTINUED] doxycycline (VIBRA-TABS) 100 MG tablet Take 1 tablet (100 mg total) by mouth 2 (two) times daily. (Patient not taking: Reported on 07/20/2021)   No facility-administered encounter medications on file as of 07/20/2021.     Lab Results  Component Value Date   WBC 5.7 03/17/2021   HGB 13.4 03/17/2021   HCT 40.0 03/17/2021   PLT 202.0 03/17/2021   GLUCOSE 135 (H) 07/18/2021   CHOL 155 07/18/2021   TRIG 68.0 07/18/2021   HDL 62.20 07/18/2021   LDLCALC 79 07/18/2021   ALT 14 07/18/2021    AST 14 07/18/2021   NA 141 07/18/2021   K 3.9 07/18/2021   CL 105 07/18/2021   CREATININE 0.79 07/18/2021   BUN 16 07/18/2021   CO2 29 07/18/2021   TSH 1.38 11/15/2020   INR 0.95 04/08/2017   HGBA1C 7.0 (H) 07/18/2021   MICROALBUR 1.9 03/17/2021    DG Bone Density  Result Date: 04/20/2021 EXAM: DUAL X-RAY ABSORPTIOMETRY (DXA) FOR BONE MINERAL DENSITY IMPRESSION: Dear Dr. Nicki Reaper, Your patient Sharalyn Ink Whitfill completed a FRAX assessment on 04/20/2021 using the [place] (analysis version: 14.10) manufactured by [employer]. The following summarizes the results of our evaluation. PATIENT BIOGRAPHICAL: Name: Caydence, Koenig Patient ID: [id number] Birth Date: [birth year] Height:    65.0 in. Gender:     [age]    [age]       Weight:    162.0 lbs. Ethnicity:  White                            Exam Date: 04/20/2021 FRAX* RESULTS:  (version: 3.5) 10-year Probability of Fracture1 Major Osteoporotic Fracture2 Hip Fracture 15.5% 2.0% Population: [place] (Caucasian) Risk Factors: History of Fracture (Adult) Based on Femur (Right) Neck BMD 1 -The 10-year probability of fracture may be lower than reported if the patient has received treatment. 2 -Major Osteoporotic Fracture: Clinical Spine, Forearm, Hip or Shoulder *FRAX is a Materials engineer of the [employer] for Metabolic Bone Disease, a [place] (WHO) [facility]. ASSESSMENT: The probability of a major osteoporotic fracture is 15.5% within the  next ten years. The probability of a hip fracture is 2.0% within the next ten years. . Your patient Roselyn Doby completed a BMD test on 04/20/2021 using the [place] (software version: 14.10) manufactured by [employer]. The following summarizes the results of our evaluation. Technologist: [facility] PATIENT BIOGRAPHICAL: Name: Denette, Hass Patient ID: [id number] Birth Date: 1951/08/25 Height: 65.0 in. Gender: Female Exam Date:  04/20/2021 Weight: 162.0 lbs. Indications: Caucasian, Diabetic, History of Breast Cancer, History of Fracture (Adult), History of Radiation, Hysterectomy, Postmenopausal Fractures: Right shoulder Treatments: Metformin, Vitamin D DENSITOMETRY RESULTS: Site      Region     Measured Date Measured Age WHO Classification Young Adult T-score BMD         %Change vs. Previous Significant Change (*) AP Spine L1-L4 04/20/2021 69.1 Normal -0.7 1.103 g/cm2 -11.4% Yes AP Spine L1-L4 06/02/2008 56.2 Normal 0.4 1.245 g/cm2 - - DualFemur Total Left 04/20/2021 69.1 Osteopenia -1.5 0.819 g/cm2 -17.9% Yes DualFemur Total Left 06/02/2008 56.2 Normal -0.1 0.998 g/cm2 - - DualFemur Total Mean 04/20/2021 69.1 Osteopenia -1.5 0.823 g/cm2 -18.2% Yes DualFemur Total Mean 06/02/2008 56.2 Normal 0.0 1.006 g/cm2 - - ASSESSMENT: The BMD measured at Femur Total Left is 0.819 g/cm2 with a T-score of -1.5. This patient is considered osteopenic according to [facility]) criteria. The scan quality is good. Compared with prior study, there has been significant decrease in the spine. Compared with prior study, there has been significant decrease in the total hip. World Pharmacologist [facility]) criteria for post-menopausal, Caucasian Women: Normal:                   T-score at or above -1 SD Osteopenia/low bone mass: T-score between -1 and -2.5 SD Osteoporosis:             T-score at or below -2.5 SD RECOMMENDATIONS: 1. All patients should optimize calcium and vitamin D intake. 2. Consider FDA-approved medical therapies in postmenopausal women and men aged 48 years and older, based on the following: a. A hip or vertebral(clinical or morphometric) fracture b. T-score < -2.5 at the femoral neck or spine after appropriate evaluation to exclude secondary causes c. Low bone mass (T-score between -1.0 and -2.5 at the femoral neck or spine) and a 10-year probability of a hip fracture > 3% or a 10-year probability of a major  osteoporosis-related fracture > 20% based on the [place]-adapted WHO algorithm 3. Clinician judgment and/or patient preferences may indicate treatment for people with 10-year fracture probabilities above or below these levels FOLLOW-UP: People with diagnosed cases of osteoporosis or at high risk for fracture should have regular bone mineral density tests. For patients eligible for Medicare, routine testing is allowed once every 2 years. The testing frequency can be increased to one year for patients who have rapidly progressing disease, those who are receiving or discontinuing medical therapy to restore bone mass, or have additional risk factors. I have reviewed this report, and agree with the above findings. [facility] Electronically Signed   By: Lowella Grip III M.D.   On: 04/20/2021 09:57       Assessment & Plan:   Problem List Items Addressed This Visit     History of breast cancer    Mammogram 11/05/20 - birads II.       Hypercholesterolemia    Continue crestor.  Low cholesterol diet and exercise.  Follow lipid panel and liver function tests.  Relevant Orders   Lipid panel   Hepatic function panel   TSH   Hypertension - Primary    Continue micardis.  Blood pressure doing well.  Follow pressures.  Follow metabolic panel.      Relevant Orders   Basic metabolic panel   Type 2 diabetes mellitus with neurological complications (HCC)    Discussed a1c 7.0.  Discussed diet and exercise.  Discussed medication changes and treatment options.  She declines to change at this time.  Wants to work on diet and exercise.  Follow met b and a1c.        Relevant Orders   Hemoglobin A1c   History of stroke (Chronic)     recent work up:  MRI/MRA brain - no acute intracranial process.  Chronic left external capsule lacunar insult.  Minimal microvascular ischemic changes.  Mild right P2 segment narrowing.  Carotid ultrasound 1-39% bilateral.  S/p loop recorder insertion.  ECHO -  ok. ILR - 2 minutes of what appeared to be afib.  On eliquis.          Einar Pheasant, MD

## 2021-07-20 NOTE — Telephone Encounter (Signed)
Patient wanting labs done before 11/25/21 appointment, needing orders placed.   Please advise

## 2021-07-21 NOTE — Telephone Encounter (Signed)
Orders have been placed.

## 2021-07-25 ENCOUNTER — Encounter: Payer: Self-pay | Admitting: Internal Medicine

## 2021-07-25 NOTE — Assessment & Plan Note (Signed)
Continue micardis.  Blood pressure doing well.  Follow pressures.  Follow metabolic panel.   

## 2021-07-25 NOTE — Assessment & Plan Note (Signed)
Discussed a1c 7.0.  Discussed diet and exercise.  Discussed medication changes and treatment options.  She declines to change at this time.  Wants to work on diet and exercise.  Follow met b and a1c.

## 2021-07-25 NOTE — Assessment & Plan Note (Signed)
Continue crestor.  Low cholesterol diet and exercise. Follow lipid panel and liver function tests.   

## 2021-07-25 NOTE — Assessment & Plan Note (Signed)
recent work up:  MRI/MRA brain - no acute intracranial process.  Chronic left external capsule lacunar insult.  Minimal microvascular ischemic changes.  Mild right P2 segment narrowing.  Carotid ultrasound 1-39% bilateral.  S/p loop recorder insertion.  ECHO - ok. ILR - 2 minutes of what appeared to be afib.  On eliquis.

## 2021-07-25 NOTE — Assessment & Plan Note (Signed)
Mammogram 11/05/20 - birads II.

## 2021-07-25 NOTE — Addendum Note (Signed)
Addended by: Alisa Graff on: 07/25/2021 10:01 AM   Modules accepted: Level of Service

## 2021-07-28 ENCOUNTER — Other Ambulatory Visit: Payer: Self-pay | Admitting: Internal Medicine

## 2021-08-01 ENCOUNTER — Ambulatory Visit: Payer: Medicare HMO | Admitting: Internal Medicine

## 2021-10-05 ENCOUNTER — Telehealth: Payer: Self-pay

## 2021-10-05 ENCOUNTER — Encounter: Payer: Self-pay | Admitting: Internal Medicine

## 2021-10-05 DIAGNOSIS — R040 Epistaxis: Secondary | ICD-10-CM

## 2021-10-05 NOTE — Addendum Note (Signed)
Addended by: Lars Masson on: 10/05/2021 02:47 PM   Modules accepted: Orders

## 2021-10-05 NOTE — Telephone Encounter (Signed)
Have had 3 nose bleeds.  First one in September when I got up in early morning to go to bathroom. Second one in October sleeping and woke up to swallowing blood. Third one this morning (November) while washing breakfast dishes. Have never had nose bleeds before. Just need to know if I need to be concerned to what is going on. Laiana

## 2021-10-05 NOTE — Telephone Encounter (Signed)
Have had 3 nose bleeds.  First one in September when I got up in early morning to go to bathroom. Second one in October sleeping and woke up to swallowing blood. Third one this morning (November) while washing breakfast dishes. Have never had nose bleeds before. Just need to know if I need to be concerned to what is going on. Genise Strack,cma

## 2021-10-05 NOTE — Telephone Encounter (Signed)
Has had 3 nose bleeds in the last 3 months. Patient said her nose bled today for about 25 minutes. She was able to get it stopped. No other symptoms. Has not reoccured. Offered patient appt for tomorrow or ENT referral. Patient would prefer just getting an ENT referral. Referral placed to [facility]. Advised patient to give [place] a call if ENT appt was too far out or if needs something prior to appt. Patient will go to acute care if she has another bleed and is not able to get it stopped.

## 2021-10-05 NOTE — Telephone Encounter (Signed)
Reviewed note.  States nosebleed today.  Did she get the bleeding to stop?  Still bleeding?  Any other symptoms?  If still bleeding, needs to be evaluated today for packing.  I can see her tomorrow if needed.

## 2021-10-06 ENCOUNTER — Encounter: Payer: Self-pay | Admitting: Internal Medicine

## 2021-10-06 NOTE — Telephone Encounter (Signed)
Noted.  Please confirm she is doing ok and ok to wait until then.

## 2021-10-18 ENCOUNTER — Other Ambulatory Visit: Payer: Self-pay | Admitting: Internal Medicine

## 2021-11-02 ENCOUNTER — Encounter: Payer: Self-pay | Admitting: Internal Medicine

## 2021-11-04 NOTE — Telephone Encounter (Signed)
LMTCB

## 2021-11-04 NOTE — Telephone Encounter (Signed)
Please call and confirm doing ok.  Have her monitor her blood pressure.  Let me know if needs anything or needs work in.

## 2021-11-07 ENCOUNTER — Telehealth: Payer: Self-pay | Admitting: Internal Medicine

## 2021-11-07 ENCOUNTER — Other Ambulatory Visit: Payer: Medicare HMO

## 2021-11-07 DIAGNOSIS — R059 Cough, unspecified: Secondary | ICD-10-CM

## 2021-11-07 LAB — HM MAMMOGRAPHY

## 2021-11-07 NOTE — Telephone Encounter (Signed)
Spoke with patient and husband. Sx started Friday. Grandkids were sick last week and they were around them. She is having fever (101), cough, congestion, decreased appetite but pushing fluids. Scheduled swab this PM and VV with PCP tomorrow. Confirmed no sob, chest tightness, etc.

## 2021-11-07 NOTE — Telephone Encounter (Signed)
Patient husband called in  advising that his wife and him are sick, it could be the flu, Patient  symptom fever ,ache coughing , appetite change  have not eaten  xfer Access nurse

## 2021-11-07 NOTE — Telephone Encounter (Signed)
See more recent phone note. Patient is being seen tomorrow.

## 2021-11-08 ENCOUNTER — Telehealth (INDEPENDENT_AMBULATORY_CARE_PROVIDER_SITE_OTHER): Payer: Medicare HMO | Admitting: Internal Medicine

## 2021-11-08 DIAGNOSIS — J111 Influenza due to unidentified influenza virus with other respiratory manifestations: Secondary | ICD-10-CM

## 2021-11-08 DIAGNOSIS — I10 Essential (primary) hypertension: Secondary | ICD-10-CM

## 2021-11-08 DIAGNOSIS — E1149 Type 2 diabetes mellitus with other diabetic neurological complication: Secondary | ICD-10-CM

## 2021-11-08 LAB — COVID-19, FLU A+B AND RSV
Influenza A, NAA: DETECTED — AB
Influenza B, NAA: NOT DETECTED
RSV, NAA: NOT DETECTED
SARS-CoV-2, NAA: NOT DETECTED

## 2021-11-08 NOTE — Progress Notes (Signed)
Patient ID: Angel Floyd, female   DOB: 28-Aug-1951, 70 y.o.   MRN: [id number]   Virtual Visit via video Note  This visit type was conducted due to national recommendations for restrictions regarding the COVID-19 pandemic (e.g. social distancing).  This format is felt to be most appropriate for this patient at this time.  All issues noted in this document were discussed and addressed.  No physical exam was performed (except for noted visual exam findings with Video Visits).   I connected with Angel Floyd by a video enabled telemedicine application and verified that I am speaking with the correct person using two identifiers. Location patient: home Location provider: work  Persons participating in the virtual visit: patient, provider  The limitations, risks, security and privacy concerns of performing an evaluation and management service by video and the availability of in person appointments have been discussed.  It has also been discussed with the patient that there may be a patient responsible charge related to this service. The patient expressed understanding and agreed to proceed.   Reason for visit: work in appt  HPI: Work in appt - influenza.  Reports had nose bleed Friday 11/04/21 - saw ENT - packed.  Noticed increased fatigue.  Developed fever - 101.  Sleeping up in chair.  No sore throat.  Some drainage.  Occasional congestion.  No sob.  Some nausea at first.  Ate yesterday and today.  No diarrhea.  States grandchildren - sick - exposed last week.  On amoxicillin for her nose.  Restarted back on eliquis.  Blood pressure today 126/59.  Tested positive for flu yesterday.    ROS: See pertinent positives and negatives per HPI.  Past Medical History:  Diagnosis Date   Anemia    Breast cancer ([place])    COVID-19 08/05/2019   Diabetes mellitus ([place])    Dyspnea    better since recovery from COVID   GERD (gastroesophageal reflux disease)    Heart murmur    History of abnormal Pap  smear    class III, required cryosurgery   Hypercholesterolemia    Hypertension    TIA (transient ischemic attack)    2008, 2018. no deficit    Past Surgical History:  Procedure Laterality Date   BREAST CYST ASPIRATION  1989   COLONOSCOPY WITH PROPOFOL N/A 11/05/2017   Procedure: COLONOSCOPY WITH PROPOFOL;  Surgeon: Lollie Sails, MD;  Location[facility] ENDOSCOPY;  Service: Endoscopy;  Laterality: N/A;   DILATION AND CURETTAGE OF UTERUS     ESOPHAGOGASTRODUODENOSCOPY (EGD) WITH PROPOFOL N/A 11/05/2017   Procedure: ESOPHAGOGASTRODUODENOSCOPY (EGD) WITH PROPOFOL;  Surgeon: Lollie Sails, MD;  Location: [facility] ENDOSCOPY;  Service: Endoscopy;  Laterality: N/A;   FOOT SURGERY     morton's neuroma removed - right foot   FOOT SURGERY  10/2009   achilles tendon release with reconstructive surgery   GYNECOLOGIC CRYOSURGERY     class III pap   HAND SURGERY     cyst removed - left hand   hysteroscopy and D&C  7/09   KNEE ARTHROSCOPY Left [id number]   removed tear & resurfaced area   KNEE SURGERY  09/20/12   torn meniscus, (Dr Leanor Kail)   [place] N/A 12/28/2020   Procedure: LOOP RECORDER INSERTION;  Surgeon: Isaias Cowman, MD;  Location: [place] CV LAB;  Service: Cardiovascular;  Laterality: N/A;   SHOULDER ARTHROSCOPY WITH ROTATOR CUFF REPAIR Right 01/26/2020   Procedure: SHOULDER ARTHROSCOPY WITH ROTATOR CUFF REPAIR with distal clavicle resection, biceps  tnotomy, subacromial decompression;  Surgeon: Lovell Sheehan, MD;  Location: [place];  Service: Orthopedics;  Laterality: Right;  Diabetic - oral meds   TUBAL LIGATION      Family History  Problem Relation Age of Onset   Lung cancer Father    Alcohol abuse Father    Cancer Father        lung   CVA Mother    Colon cancer Mother    Alcohol abuse Mother    Cancer Mother        colon   Stroke Mother    Colon cancer Sister    Diabetes Maternal Grandfather     SOCIAL HX: reviewed.     Current Outpatient Medications:    cholecalciferol (VITAMIN D) 25 MCG (1000 UNIT) tablet, Take 1,000 Units by mouth daily., Disp: , Rfl:    ELIQUIS 5 MG TABS tablet, Take 1 tablet by mouth 2 (two) times daily., Disp: , Rfl:    Fe Fum-FePoly-Vit C-Vit B3 (INTEGRA) 62.5-62.5-40-3 MG CAPS, Take 1 capsule by mouth every other day., Disp: 45 capsule, Rfl: 3   fish oil-omega-3 fatty acids 1000 MG capsule, Take 2 g by mouth daily. , Disp: , Rfl:    glucose blood test strip, Use as instructed to check blood sugars three times daily. Has One Touch Ultra glucometer. Dx 250.00, Disp: 100 each, Rfl: 2   metFORMIN (GLUCOPHAGE) 500 MG tablet, TAKE 1 TABLET DAILY, Disp: 90 tablet, Rfl: 1   pantoprazole (PROTONIX) 40 MG tablet, TAKE 1 TABLET DAILY, Disp: 90 tablet, Rfl: 1   rosuvastatin (CRESTOR) 20 MG tablet, TAKE 1 TABLET DAILY, Disp: 90 tablet, Rfl: 1   telmisartan (MICARDIS) 20 MG tablet, Take 1 tablet (20 mg total) by mouth daily., Disp: 90 tablet, Rfl: 3   tiZANidine (ZANAFLEX) 4 MG tablet, Take 1 tablet (4 mg total) by mouth at bedtime., Disp: 30 tablet, Rfl: 0  EXAM:  VITALS per patient if applicable: 671/24  GENERAL: alert, oriented, appears well and in no acute distress.  Appears not to feel well.    HEENT: atraumatic, conjunttiva clear, no obvious abnormalities on inspection of external nose and ears  NECK: normal movements of the head and neck  LUNGS: on inspection no signs of respiratory distress, breathing rate appears normal, no obvious gross SOB, gasping or wheezing  CV: no obvious cyanosis  PSYCH/NEURO: pleasant and cooperative, no obvious depression or anxiety, speech and thought processing grossly intact  ASSESSMENT AND PLAN:  Discussed the following assessment and plan:  Problem List Items Addressed This Visit     Hypertension    Continue micardis.  Follow pressures.        Influenza    Symptoms as outlined.  Tested yesterday - positive for flu.  Out of window for  tamiflu.  No chest pain or increased sob.  Her nasal packing is contributing to her discomfort.  Supposed to have removed soon.  Treat symptoms.  Discussed saline nasal spray, mucinex/robitussin.  Rest.  Fluids.  Follow.  Call with update.        Type 2 diabetes mellitus with neurological complications (HCC)    Follow sugars - with infection.  Stay hydrated.        Return if symptoms worsen or fail to improve, for keep scheduled. .   I discussed the assessment and treatment plan with the patient. The patient was provided an opportunity to ask questions and all were answered. The patient agreed with the plan and demonstrated an  understanding of the instructions.   The patient was advised to call back or seek an in-person evaluation if the symptoms worsen or if the condition fails to improve as anticipated.    Einar Pheasant, MD

## 2021-11-16 ENCOUNTER — Encounter: Payer: Self-pay | Admitting: Internal Medicine

## 2021-11-16 ENCOUNTER — Other Ambulatory Visit: Payer: Self-pay

## 2021-11-18 ENCOUNTER — Encounter: Payer: Self-pay | Admitting: Internal Medicine

## 2021-11-18 DIAGNOSIS — J111 Influenza due to unidentified influenza virus with other respiratory manifestations: Secondary | ICD-10-CM | POA: Insufficient documentation

## 2021-11-18 NOTE — Assessment & Plan Note (Signed)
Symptoms as outlined.  Tested yesterday - positive for flu.  Out of window for tamiflu.  No chest pain or increased sob.  Her nasal packing is contributing to her discomfort.  Supposed to have removed soon.  Treat symptoms.  Discussed saline nasal spray, mucinex/robitussin.  Rest.  Fluids.  Follow.  Call with update.

## 2021-11-18 NOTE — Assessment & Plan Note (Signed)
Follow sugars - with infection.  Stay hydrated.

## 2021-11-18 NOTE — Assessment & Plan Note (Signed)
Continue micardis.  Follow pressures.  

## 2021-11-23 ENCOUNTER — Other Ambulatory Visit (INDEPENDENT_AMBULATORY_CARE_PROVIDER_SITE_OTHER): Payer: Medicare HMO

## 2021-11-23 ENCOUNTER — Other Ambulatory Visit: Payer: Self-pay

## 2021-11-23 DIAGNOSIS — E1149 Type 2 diabetes mellitus with other diabetic neurological complication: Secondary | ICD-10-CM

## 2021-11-23 DIAGNOSIS — E7800 Pure hypercholesterolemia, unspecified: Secondary | ICD-10-CM | POA: Diagnosis not present

## 2021-11-23 DIAGNOSIS — I10 Essential (primary) hypertension: Secondary | ICD-10-CM | POA: Diagnosis not present

## 2021-11-23 LAB — HEPATIC FUNCTION PANEL
ALT: 16 U/L (ref 0–35)
AST: 14 U/L (ref 0–37)
Albumin: 4.2 g/dL (ref 3.5–5.2)
Alkaline Phosphatase: 54 U/L (ref 39–117)
Bilirubin, Direct: 0.2 mg/dL (ref 0.0–0.3)
Total Bilirubin: 0.9 mg/dL (ref 0.2–1.2)
Total Protein: 6 g/dL (ref 6.0–8.3)

## 2021-11-23 LAB — BASIC METABOLIC PANEL
BUN: 16 mg/dL (ref 6–23)
CO2: 29 mEq/L (ref 19–32)
Calcium: 9.2 mg/dL (ref 8.4–10.5)
Chloride: 106 mEq/L (ref 96–112)
Creatinine, Ser: 0.74 mg/dL (ref 0.40–1.20)
GFR: 82.36 mL/min (ref >60.00)
Glucose, Bld: 133 mg/dL — ABNORMAL HIGH (ref 70–99)
Potassium: 4 mEq/L (ref 3.5–5.1)
Sodium: 142 mEq/L (ref 135–145)

## 2021-11-23 LAB — LIPID PANEL
Cholesterol: 143 mg/dL (ref 0–200)
HDL: 52.4 mg/dL (ref >39.00)
LDL Cholesterol: 79 mg/dL (ref 0–99)
NonHDL: 90.61
Total CHOL/HDL Ratio: 3
Triglycerides: 59 mg/dL (ref 0.0–149.0)
VLDL: 11.8 mg/dL (ref 0.0–40.0)

## 2021-11-23 LAB — TSH: TSH: 1.67 u[IU]/mL (ref 0.35–5.50)

## 2021-11-23 LAB — HEMOGLOBIN A1C: Hgb A1c MFr Bld: 7 % — ABNORMAL HIGH (ref 4.6–6.5)

## 2021-11-25 ENCOUNTER — Other Ambulatory Visit: Payer: Self-pay

## 2021-11-25 ENCOUNTER — Ambulatory Visit: Payer: 59 | Admitting: Internal Medicine

## 2021-11-25 DIAGNOSIS — D508 Other iron deficiency anemias: Secondary | ICD-10-CM

## 2021-11-25 DIAGNOSIS — Z8673 Personal history of transient ischemic attack (TIA), and cerebral infarction without residual deficits: Secondary | ICD-10-CM

## 2021-11-25 DIAGNOSIS — E7800 Pure hypercholesterolemia, unspecified: Secondary | ICD-10-CM | POA: Diagnosis not present

## 2021-11-25 DIAGNOSIS — I10 Essential (primary) hypertension: Secondary | ICD-10-CM | POA: Diagnosis not present

## 2021-11-25 DIAGNOSIS — E1149 Type 2 diabetes mellitus with other diabetic neurological complication: Secondary | ICD-10-CM | POA: Diagnosis not present

## 2021-11-25 DIAGNOSIS — Z23 Encounter for immunization: Secondary | ICD-10-CM | POA: Diagnosis not present

## 2021-11-25 DIAGNOSIS — Z7185 Encounter for immunization safety counseling: Secondary | ICD-10-CM

## 2021-11-25 DIAGNOSIS — Z853 Personal history of malignant neoplasm of breast: Secondary | ICD-10-CM

## 2021-11-25 LAB — HM DIABETES FOOT EXAM

## 2021-11-25 MED ORDER — INTEGRA 62.5-62.5-40-3 MG PO CAPS: 1.0000 | ORAL_CAPSULE | ORAL | 3 refills | Status: DC

## 2021-11-25 MED ORDER — METFORMIN HCL 500 MG PO TABS: 500.0000 mg | ORAL_TABLET | Freq: Two times a day (BID) | ORAL | 1 refills | Status: DC

## 2021-11-25 MED ORDER — TELMISARTAN 20 MG PO TABS: 20.0000 mg | ORAL_TABLET | Freq: Every day | ORAL | 3 refills | Status: DC

## 2021-11-25 MED ORDER — PANTOPRAZOLE SODIUM 40 MG PO TBEC: 40.0000 mg | DELAYED_RELEASE_TABLET | Freq: Every day | ORAL | 3 refills | Status: DC

## 2021-11-25 MED ORDER — METFORMIN HCL 500 MG PO TABS: 500.0000 mg | ORAL_TABLET | Freq: Every day | ORAL | 1 refills | Status: DC

## 2021-11-25 NOTE — Progress Notes (Addendum)
Patient ID: Angel Floyd, female   DOB: [birth year], 71 y.o.   MRN: [id number]   Subjective:    Patient ID: Angel Floyd, female    DOB: 05-14-1951, 71 y.o.   MRN: [id number]  This visit occurred during the SARS-CoV-2 public health emergency.  Safety protocols were in place, including screening questions prior to the visit, additional usage of staff PPE, and extensive cleaning of exam room while observing appropriate contact time as indicated for disinfecting solutions.   Patient here for a scheduled follow up.   Chief Complaint  Patient presents with   Diabetes   Gastroesophageal Reflux   Hyperlipidemia   .   HPI Recently diagnosed with influenza.  Doing better now.  Symptoms have resolved.  Has also recently had problems with nose bleeds.  Has been followed by ENT.  S/p packing.  She stopped eliquis on her own.  Has messaged cardiology to make them aware.  She is currently taking aspirin and plavix.  Discussed given afib, reason for eliquis.  Discussed stroke prevention.  Expresses understanding of above, but reports desire not to go back on eliquis.  She has lost weight.  Is eating.  Did not eat as much when sick.  No nausea or vomiting.  No chest pain or sob reported.  No abdominal pain or bowel change reported.  Discussed labs.  A1c 7.0.  discussed increasing metformin to bid.  Agreeable.     Past Medical History:  Diagnosis Date   Anemia    Breast cancer ([place])    COVID-19 08/05/2019   Diabetes mellitus ([place])    Dyspnea    better since recovery from COVID   GERD (gastroesophageal reflux disease)    Heart murmur    History of abnormal Pap smear    class III, required cryosurgery   Hypercholesterolemia    Hypertension    TIA (transient ischemic attack)    2008, 2018. no deficit   Past Surgical History:  Procedure Laterality Date   BREAST CYST ASPIRATION  1989   COLONOSCOPY WITH PROPOFOL N/A 11/05/2017   Procedure: COLONOSCOPY WITH PROPOFOL;  Surgeon: Lollie Sails,  MD;  Location: [facility] ENDOSCOPY;  Service: Endoscopy;  Laterality: N/A;   DILATION AND CURETTAGE OF UTERUS     ESOPHAGOGASTRODUODENOSCOPY (EGD) WITH PROPOFOL N/A 11/05/2017   Procedure: ESOPHAGOGASTRODUODENOSCOPY (EGD) WITH PROPOFOL;  Surgeon: Lollie Sails, MD;  Location: [facility] ENDOSCOPY;  Service: Endoscopy;  Laterality: N/A;   FOOT SURGERY     morton's neuroma removed - right foot   FOOT SURGERY  10/2009   achilles tendon release with reconstructive surgery   GYNECOLOGIC CRYOSURGERY     class III pap   HAND SURGERY     cyst removed - left hand   hysteroscopy and D&C  7/09   KNEE ARTHROSCOPY Left [id number]   removed tear & resurfaced area   KNEE SURGERY  09/20/12   torn meniscus, (Dr Leanor Kail)   [place] N/A 12/28/2020   Procedure: LOOP RECORDER INSERTION;  Surgeon: Isaias Cowman, MD;  Location: [place] CV LAB;  Service: Cardiovascular;  Laterality: N/A;   SHOULDER ARTHROSCOPY WITH ROTATOR CUFF REPAIR Right 01/26/2020   Procedure: SHOULDER ARTHROSCOPY WITH ROTATOR CUFF REPAIR with distal clavicle resection, biceps tnotomy, subacromial decompression;  Surgeon: Lovell Sheehan, MD;  Location: [place];  Service: Orthopedics;  Laterality: Right;  Diabetic - oral meds   TUBAL LIGATION     Family History  Problem Relation Age of Onset  Lung cancer Father    Alcohol abuse Father    Cancer Father        lung   CVA Mother    Colon cancer Mother    Alcohol abuse Mother    Cancer Mother        colon   Stroke Mother    Colon cancer Sister    Diabetes Maternal Grandfather    Social History   Socioeconomic History   Marital status: Married    Spouse name: Not on file   Number of children: 2   Years of education: Not on file   Highest education level: Not on file  Occupational History   Not on file  Tobacco Use   Smoking status: Never   Smokeless tobacco: Never  Vaping Use   Vaping Use: Never used  Substance and Sexual Activity    Alcohol use: No    Alcohol/week: 0.0 standard drinks   Drug use: No   Sexual activity: Not Currently  Other Topics Concern   Not on file  Social History Narrative   Not on file   Social Determinants of Health   Financial Resource Strain: Not on file  Food Insecurity: Not on file  Transportation Needs: Not on file  Physical Activity: Not on file  Stress: Not on file  Social Connections: Not on file     Review of Systems  Constitutional:  Negative for appetite change.       Weight loss as outlined.    HENT:  Negative for congestion and sinus pressure.   Respiratory:  Negative for cough, chest tightness and shortness of breath.   Cardiovascular:  Negative for chest pain, palpitations and leg swelling.  Gastrointestinal:  Negative for abdominal pain, diarrhea, nausea and vomiting.  Genitourinary:  Negative for difficulty urinating and dysuria.  Musculoskeletal:  Negative for joint swelling and myalgias.  Skin:  Negative for color change and rash.  Neurological:  Negative for dizziness, light-headedness and headaches.  Psychiatric/Behavioral:  Negative for agitation and dysphoric mood.       Objective:     BP 122/70    Pulse 72    Temp 97.9 F (36.6 C)    Resp 16    Ht $R'5\' 5"'yD$  (1.651 m)    Wt 148 lb 9.6 oz (67.4 kg)    SpO2 98%    BMI 24.73 kg/m  Wt Readings from Last 3 Encounters:  11/25/21 148 lb 9.6 oz (67.4 kg)  07/20/21 156 lb 6.4 oz (70.9 kg)  04/27/21 162 lb (73.5 kg)    Physical Exam Vitals reviewed.  Constitutional:      General: She is not in acute distress.    Appearance: Normal appearance.  HENT:     Head: Normocephalic and atraumatic.     Right Ear: External ear normal.     Left Ear: External ear normal.  Eyes:     General: No scleral icterus.       Right eye: No discharge.        Left eye: No discharge.     Conjunctiva/sclera: Conjunctivae normal.  Neck:     Thyroid: No thyromegaly.  Cardiovascular:     Rate and Rhythm: Normal rate and regular  rhythm.  Pulmonary:     Effort: No respiratory distress.     Breath sounds: Normal breath sounds. No wheezing.  Abdominal:     General: Bowel sounds are normal.     Palpations: Abdomen is soft.     Tenderness: There is  no abdominal tenderness.  Musculoskeletal:        General: No swelling or tenderness.     Cervical back: Neck supple. No tenderness.  Lymphadenopathy:     Cervical: No cervical adenopathy.  Skin:    Findings: No erythema or rash.  Neurological:     Mental Status: She is alert.  Psychiatric:        Mood and Affect: Mood normal.        Behavior: Behavior normal.     Outpatient Encounter Medications as of 11/25/2021  Medication Sig   ASPIRIN 81 PO Take 81 mg by mouth daily.   clopidogrel (PLAVIX) 75 MG tablet Take 75 mg by mouth daily.   cholecalciferol (VITAMIN D) 25 MCG (1000 UNIT) tablet Take 1,000 Units by mouth daily.   Fe Fum-FePoly-Vit C-Vit B3 (INTEGRA) 62.5-62.5-40-3 MG CAPS Take 1 capsule by mouth every other day.   fish oil-omega-3 fatty acids 1000 MG capsule Take 2 g by mouth daily.    glucose blood test strip Use as instructed to check blood sugars three times daily. Has One Touch Ultra glucometer. Dx 250.00   metFORMIN (GLUCOPHAGE) 500 MG tablet Take 1 tablet (500 mg total) by mouth 2 (two) times daily.   pantoprazole (PROTONIX) 40 MG tablet Take 1 tablet (40 mg total) by mouth daily.   rosuvastatin (CRESTOR) 20 MG tablet TAKE 1 TABLET DAILY   telmisartan (MICARDIS) 20 MG tablet Take 1 tablet (20 mg total) by mouth daily.   tiZANidine (ZANAFLEX) 4 MG tablet Take 1 tablet (4 mg total) by mouth at bedtime.   [DISCONTINUED] ELIQUIS 5 MG TABS tablet Take 1 tablet by mouth 2 (two) times daily.   [DISCONTINUED] Fe Fum-FePoly-Vit C-Vit B3 (INTEGRA) 62.5-62.5-40-3 MG CAPS Take 1 capsule by mouth every other day.   [DISCONTINUED] metFORMIN (GLUCOPHAGE) 500 MG tablet TAKE 1 TABLET DAILY   [DISCONTINUED] metFORMIN (GLUCOPHAGE) 500 MG tablet Take 1 tablet (500 mg  total) by mouth daily.   [DISCONTINUED] pantoprazole (PROTONIX) 40 MG tablet TAKE 1 TABLET DAILY   [DISCONTINUED] telmisartan (MICARDIS) 20 MG tablet Take 1 tablet (20 mg total) by mouth daily.   No facility-administered encounter medications on file as of 11/25/2021.     Lab Results  Component Value Date   WBC 5.7 03/17/2021   HGB 13.4 03/17/2021   HCT 40.0 03/17/2021   PLT 202.0 03/17/2021   GLUCOSE 133 (H) 11/23/2021   CHOL 143 11/23/2021   TRIG 59.0 11/23/2021   HDL 52.40 11/23/2021   LDLCALC 79 11/23/2021   ALT 16 11/23/2021   AST 14 11/23/2021   NA 142 11/23/2021   K 4.0 11/23/2021   CL 106 11/23/2021   CREATININE 0.74 11/23/2021   BUN 16 11/23/2021   CO2 29 11/23/2021   TSH 1.67 11/23/2021   INR 0.95 04/08/2017   HGBA1C 7.0 (H) 11/23/2021   MICROALBUR 1.9 03/17/2021    DG Bone Density  Result Date: 04/20/2021 EXAM: DUAL X-RAY ABSORPTIOMETRY (DXA) FOR BONE MINERAL DENSITY IMPRESSION: Dear Dr. Nicki Reaper, Your patient Sharalyn Ink Petko completed a FRAX assessment on 04/20/2021 using the [place] (analysis version: 14.10) manufactured by [employer]. The following summarizes the results of our evaluation. PATIENT BIOGRAPHICAL: Name: Maisley, Hainsworth Patient ID: [id number] Birth Date: July 18, 1951 Height:    65.0 in. Gender:     Female    Age:        70.1       Weight:    162.0 lbs. Ethnicity:  White  Exam Date: 04/20/2021 FRAX* RESULTS:  (version: 3.5) 10-year Probability of Fracture1 Major Osteoporotic Fracture2 Hip Fracture 15.5% 2.0% Population: [place] (Caucasian) Risk Factors: History of Fracture (Adult) Based on Femur (Right) Neck BMD 1 -The 10-year probability of fracture may be lower than reported if the patient has received treatment. 2 -Major Osteoporotic Fracture: Clinical Spine, Forearm, Hip or Shoulder *FRAX is a Materials engineer of the [employer] for Metabolic Bone Disease, a [place] (WHO)  [facility]. ASSESSMENT: The probability of a major osteoporotic fracture is 15.5% within the next ten years. The probability of a hip fracture is 2.0% within the next ten years. . Your patient Charmine Bockrath completed a BMD test on 04/20/2021 using the [place] (software version: 14.10) manufactured by [employer]. The following summarizes the results of our evaluation. Technologist: [facility] PATIENT BIOGRAPHICAL: Name: Vasilia, Dise Patient ID: [id number] Birth Date: Apr 13, 1951 Height: 65.0 in. Gender: Female Exam Date: 04/20/2021 Weight: 162.0 lbs. Indications: Caucasian, Diabetic, History of Breast Cancer, History of Fracture (Adult), History of Radiation, Hysterectomy, Postmenopausal Fractures: Right shoulder Treatments: Metformin, Vitamin D DENSITOMETRY RESULTS: Site      Region     Measured Date Measured Age WHO Classification Young Adult T-score BMD         %Change vs. Previous Significant Change (*) AP Spine L1-L4 04/20/2022 69.1 Normal -0.7 1.103 g/cm2 -11.4% Yes AP Spine L1-L4 06/02/2008 56.2 Normal 0.4 1.245 g/cm2 - - DualFemur Total Left 04/20/2021 69.1 Osteopenia -1.5 0.819 g/cm2 -17.9% Yes DualFemur Total Left 06/02/2008 56.2 Normal -0.1 0.998 g/cm2 - - DualFemur Total Mean 04/20/2021 69.1 Osteopenia -1.5 0.823 g/cm2 -18.2% Yes DualFemur Total Mean 06/02/2008 56.2 Normal 0.0 1.006 g/cm2 - - ASSESSMENT: The BMD measured at Femur Total Left is 0.819 g/cm2 with a T-score of -1.5. This patient is considered osteopenic according to [facility]) criteria. The scan quality is good. Compared with prior study, there has been significant decrease in the spine. Compared with prior study, there has been significant decrease in the total hip. World Pharmacologist [facility]) criteria for post-menopausal, Caucasian Women: Normal:                   T-score at or above -1 SD Osteopenia/low bone mass: T-score between -1 and -2.5 SD Osteoporosis:             T-score at  or below -2.5 SD RECOMMENDATIONS: 1. All patients should optimize calcium and vitamin D intake. 2. Consider FDA-approved medical therapies in postmenopausal women and men aged 51 years and older, based on the following: a. A hip or vertebral(clinical or morphometric) fracture b. T-score < -2.5 at the femoral neck or spine after appropriate evaluation to exclude secondary causes c. Low bone mass (T-score between -1.0 and -2.5 at the femoral neck or spine) and a 10-year probability of a hip fracture > 3% or a 10-year probability of a major osteoporosis-related fracture > 20% based on the [place]-adapted WHO algorithm 3. Clinician judgment and/or patient preferences may indicate treatment for people with 10-year fracture probabilities above or below these levels FOLLOW-UP: People with diagnosed cases of osteoporosis or at high risk for fracture should have regular bone mineral density tests. For patients eligible for Medicare, routine testing is allowed once every 2 years. The testing frequency can be increased to one year for patients who have rapidly progressing disease, those who are receiving or discontinuing medical therapy to restore bone mass, or have additional  risk factors. I have reviewed this report, and agree with the above findings. [facility] Electronically Signed   By: Lowella Grip III M.D.   On: 04/20/2021 09:57       Assessment & Plan:   Problem List Items Addressed This Visit     Anemia    Follow cbc.       Relevant Medications   Fe Fum-FePoly-Vit C-Vit B3 (INTEGRA) 62.5-62.5-40-3 MG CAPS   History of breast cancer    Mammogram 11/07/21 - ok.        Hypercholesterolemia    Continue crestor.  Low cholesterol diet and exercise.  Follow lipid panel and liver function tests.        Relevant Medications   ASPIRIN 81 PO   telmisartan (MICARDIS) 20 MG tablet   Other Relevant Orders   Hepatic function panel   Lipid panel   Hypertension    Continue micardis.  Blood  pressure as outlined.  No changes.  Follow pressures.        Relevant Medications   ASPIRIN 81 PO   telmisartan (MICARDIS) 20 MG tablet   Other Relevant Orders   CBC with Differential/Platelet   Basic metabolic panel   Type 2 diabetes mellitus with neurological complications (HCC)    G6K 7.0 on recent check.  On metformin.  Increase to bid.  Has lost weight.  Eating.  Increased metformin to bid.  Follow met b anda 1c.       Relevant Medications   ASPIRIN 81 PO   telmisartan (MICARDIS) 20 MG tablet   metFORMIN (GLUCOPHAGE) 500 MG tablet   Other Relevant Orders   Hemoglobin A1c   Microalbumin / creatinine urine ratio   Vaccine counseling    shingrx vaccine given today.        History of stroke (Chronic)     recent work up:  MRI/MRA brain - no acute intracranial process.  Chronic left external capsule lacunar insult.  Minimal microvascular ischemic changes.  Mild right P2 segment narrowing.  Carotid ultrasound 1-39% bilateral.  S/p loop recorder insertion.  ECHO - ok. ILR - 2 minutes of what appeared to be afib.  Was on eliquis.  She stopped eliquis on her own due to nosebleeds.  Discussed recommendation for eliquis and stroke prevention.  She declines to restart.  On aspirin and plavix.  Agreeable to f/u with cardiology to discuss.          Other Visit Diagnoses     Need for shingles vaccine       Relevant Orders   Varicella-zoster vaccine IM (Shingrix) (Completed)        Einar Pheasant, MD

## 2021-11-26 ENCOUNTER — Encounter: Payer: Self-pay | Admitting: Internal Medicine

## 2021-11-26 DIAGNOSIS — Z7185 Encounter for immunization safety counseling: Secondary | ICD-10-CM | POA: Insufficient documentation

## 2021-11-26 NOTE — Assessment & Plan Note (Signed)
Follow cbc.  

## 2021-11-26 NOTE — Assessment & Plan Note (Signed)
shingrx vaccine given today.

## 2021-11-26 NOTE — Assessment & Plan Note (Signed)
Continue micardis.  Blood pressure as outlined.  No changes.  Follow pressures.

## 2021-11-26 NOTE — Assessment & Plan Note (Signed)
Continue crestor.  Low cholesterol diet and exercise. Follow lipid panel and liver function tests.   

## 2021-11-26 NOTE — Assessment & Plan Note (Signed)
Mammogram 11/07/21 - ok.   

## 2021-11-26 NOTE — Assessment & Plan Note (Signed)
recent work up:  MRI/MRA brain - no acute intracranial process.  Chronic left external capsule lacunar insult.  Minimal microvascular ischemic changes.  Mild right P2 segment narrowing.  Carotid ultrasound 1-39% bilateral.  S/p loop recorder insertion.  ECHO - ok. ILR - 2 minutes of what appeared to be afib.  Was on eliquis.  She stopped eliquis on her own due to nosebleeds.  Discussed recommendation for eliquis and stroke prevention.  She declines to restart.  On aspirin and plavix.  Agreeable to f/u with cardiology to discuss.

## 2021-11-26 NOTE — Assessment & Plan Note (Signed)
a1c 7.0 on recent check.  On metformin.  Increase to bid.  Has lost weight.  Eating.  Increased metformin to bid.  Follow met b anda 1c.

## 2021-11-30 LAB — HM DIABETES EYE EXAM

## 2021-12-08 ENCOUNTER — Encounter: Payer: Self-pay | Admitting: Internal Medicine

## 2022-01-02 ENCOUNTER — Encounter: Payer: Self-pay | Admitting: Internal Medicine

## 2022-01-09 ENCOUNTER — Other Ambulatory Visit: Payer: Self-pay

## 2022-01-09 ENCOUNTER — Emergency Department: Payer: Medicare HMO

## 2022-01-09 ENCOUNTER — Encounter: Payer: Self-pay | Admitting: Internal Medicine

## 2022-01-09 ENCOUNTER — Emergency Department
Admission: EM | Admit: 2022-01-09 | Discharge: 2022-01-09 | Disposition: A | Discharge status: Home / Self Care | Payer: Medicare HMO | Attending: Emergency Medicine | Admitting: Emergency Medicine

## 2022-01-09 DIAGNOSIS — Z7982 Long term (current) use of aspirin: Secondary | ICD-10-CM | POA: Insufficient documentation

## 2022-01-09 DIAGNOSIS — R110 Nausea: Secondary | ICD-10-CM | POA: Diagnosis not present

## 2022-01-09 DIAGNOSIS — Z7902 Long term (current) use of antithrombotics/antiplatelets: Secondary | ICD-10-CM | POA: Insufficient documentation

## 2022-01-09 DIAGNOSIS — E119 Type 2 diabetes mellitus without complications: Secondary | ICD-10-CM | POA: Insufficient documentation

## 2022-01-09 DIAGNOSIS — R42 Dizziness and giddiness: Secondary | ICD-10-CM | POA: Insufficient documentation

## 2022-01-09 DIAGNOSIS — I10 Essential (primary) hypertension: Secondary | ICD-10-CM | POA: Insufficient documentation

## 2022-01-09 LAB — URINALYSIS, ROUTINE W REFLEX MICROSCOPIC
Bilirubin Urine: NEGATIVE
Glucose, UA: 50 mg/dL — AB
Hgb urine dipstick: NEGATIVE
Ketones, ur: NEGATIVE mg/dL
Leukocytes,Ua: NEGATIVE
Nitrite: NEGATIVE
Protein, ur: NEGATIVE mg/dL
Specific Gravity, Urine: 1.005 (ref 1.005–1.030)
pH: 9 — ABNORMAL HIGH (ref 5.0–8.0)

## 2022-01-09 LAB — BASIC METABOLIC PANEL
Anion gap: 9 (ref 5–15)
BUN: 21 mg/dL (ref 8–23)
CO2: 25 mmol/L (ref 22–32)
Calcium: 9.4 mg/dL (ref 8.9–10.3)
Chloride: 104 mmol/L (ref 98–111)
Creatinine, Ser: 0.77 mg/dL (ref 0.44–1.00)
GFR, Estimated: >60 mL/min (ref >60)
Glucose, Bld: 158 mg/dL — ABNORMAL HIGH (ref 70–99)
Potassium: 3.8 mmol/L (ref 3.5–5.1)
Sodium: 138 mmol/L (ref 135–145)

## 2022-01-09 LAB — CBC
HCT: 43 % (ref 36.0–46.0)
Hemoglobin: 14.3 g/dL (ref 12.0–15.0)
MCH: 29.6 pg (ref 26.0–34.0)
MCHC: 33.3 g/dL (ref 30.0–36.0)
MCV: 89 fL (ref 80.0–100.0)
Platelets: 223 10*3/uL (ref 150–400)
RBC: 4.83 MIL/uL (ref 3.87–5.11)
RDW: 12.5 % (ref 11.5–15.5)
WBC: 5.2 10*3/uL (ref 4.0–10.5)
nRBC: 0 % (ref 0.0–0.2)

## 2022-01-09 LAB — CBG MONITORING, ED: Glucose-Capillary: 153 mg/dL — ABNORMAL HIGH (ref 70–99)

## 2022-01-09 MED ORDER — MECLIZINE HCL 25 MG PO TABS
25.0000 mg | ORAL_TABLET | Freq: Once | ORAL | Status: AC
  Administered 2022-01-09: 25 mg via ORAL
  Filled 2022-01-09: qty 1

## 2022-01-09 MED ORDER — MECLIZINE HCL 25 MG PO TABS: 25.0000 mg | ORAL_TABLET | Freq: Three times a day (TID) | ORAL | 0 refills | Status: AC | PRN

## 2022-01-09 NOTE — ED Notes (Signed)
RN at bedside to ambulate pt. Pt ambulatory with stead gait and no complaints. MD made aware.

## 2022-01-09 NOTE — ED Provider Notes (Signed)
[facility] Provider Note    Event Date/Time   First MD Initiated Contact with Patient 01/09/22 [phone]     (approximate)   History   Dizziness   HPI  Angel Floyd is a 71 y.o. female  who per cardiology note dated 12/29/21 has history of a fib, HTN, HLD, DM2 on plavix who presents to the emergency department today because of concern for an episode of dizziness. The patient states that it occurred this morning shortly after waking up. She had just used the restroom and then as she was getting back in bed felt dizzy and noticed that her vision was spinning. The patient laid down and it slightly improved but came back when she tried to stand up again. At the time of my exam she does feel better. Denies similar symptoms in the past. Has history of afib and is currently on aspirin and plavax, has had TIAs in the past.     Physical Exam   Triage Vital Signs: ED Triage Vitals  Enc Vitals Group     BP 01/09/22 0812 (!) 147/82     Pulse Rate 01/09/22 0810 74     Resp 01/09/22 0810 (!) 21     Temp 01/09/22 0811 97.8 F (36.6 C)     Temp Source 01/09/22 0811 Oral     SpO2 01/09/22 0810 100 %     Weight 01/09/22 0807 150 lb (68 kg)     Height 01/09/22 0807 5\' 5"  (1.651 m)     Head Circumference --      Peak Flow --      Pain Score 01/09/22 0807 0     Pain Loc --      Pain Edu? --      Excl. in [place]? --     Most recent vital signs: Vitals:   01/09/22 0811 01/09/22 0812  BP:  (!) 147/82  Pulse:    Resp:    Temp: 97.8 F (36.6 C)   SpO2:      General: Awake, no distress.  CV:  Good peripheral perfusion.  Resp:  Normal effort. Regular rhythm.  Abd:  No distention.     ED Results / Procedures / Treatments   Labs (all labs ordered are listed, but only abnormal results are displayed) Labs Reviewed  BASIC METABOLIC PANEL - Abnormal; Notable for the following components:      Result Value   Glucose, Bld 158 (*)    All other components within normal  limits  CBG MONITORING, ED - Abnormal; Notable for the following components:   Glucose-Capillary 153 (*)    All other components within normal limits  CBC  URINALYSIS, ROUTINE W REFLEX MICROSCOPIC     EKG  I, Nance Pear, attending physician, personally viewed and interpreted this EKG  EKG Time: 0807 Rate: 62 Rhythm: sinus rhythm Axis: normal Intervals: qtc 429 QRS: incomplete RBBB ST changes: no st elevation Impression: abnormal ekg    RADIOLOGY MRI brain I independently interpreted and visualized the MR brain. My interpretation: No large mass Radiology interpretation:  IMPRESSION:  1. No acute intracranial abnormality.  2. Stable noncontrast MRI appearance of the brain since last year.  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         PROCEDURES:  Critical Care performed: No  Procedures   MEDICATIONS ORDERED IN ED: Medications - No data to display   IMPRESSION / MDM / [place] / ED COURSE  I reviewed the triage vital signs and the nursing notes.                              Differential diagnosis includes, but is not limited to, CVA, vertigo, arrhythmia, anemia, electrolyte abnormality.  Patient presented to the emergency department today because of concerns for dizziness and some nausea.  No other focal neurodeficits.  Patient does have a history of TIAs.  MRI was obtained although this did not show any acute stroke.  Blood work without anemia or concerning electrolyte abnormality. This time I do think vertigo likely.  Patient did feel better after medications.  Given clinical improvement at this time I do not feel the patient admission is necessary.  Will discharge.  Will give ENT follow-up information.  FINAL CLINICAL IMPRESSION(S) / ED DIAGNOSES   Final diagnoses:  Vertigo     Rx / DC Orders   ED Discharge Orders          Ordered    meclizine (ANTIVERT) 25 MG tablet  3 times daily PRN         01/09/22 1201             Note:  This document was prepared using Dragon voice recognition software and may include unintentional dictation errors.     Nance Pear, MD 01/09/22 [phone]

## 2022-01-09 NOTE — ED Notes (Signed)
Pt transported to MRI 

## 2022-01-09 NOTE — ED Triage Notes (Signed)
Pt to ED via [facility] from home. Upon waking pt had an episode of dizziness this morning, HA and nausea. Pt CBG 157. Pt states she was on eliquis but continued to have nosebleeds and took herself off but is still on plavix. Pt states she has surgery for a watchman's device in March. Pt denies CP, SOB or LOC. EMS stroke screen negative   Pt with hx afib, TIA, DM.

## 2022-01-09 NOTE — ED Notes (Signed)
Pt on phone with MRI for screening questions  

## 2022-01-09 NOTE — Telephone Encounter (Signed)
Pt scheduled  

## 2022-01-09 NOTE — Discharge Instructions (Addendum)
Please seek medical attention for any high fevers, chest pain, shortness of breath, change in behavior, persistent vomiting, bloody stool or any other new or concerning symptoms.  

## 2022-02-08 ENCOUNTER — Encounter: Payer: Self-pay | Admitting: Internal Medicine

## 2022-02-09 ENCOUNTER — Encounter: Payer: Self-pay | Admitting: Internal Medicine

## 2022-02-10 ENCOUNTER — Encounter: Payer: Self-pay | Admitting: Internal Medicine

## 2022-02-10 NOTE — Telephone Encounter (Signed)
Called patient to confirm doing ok. She has been checking her sugar and it is now back to 136. Confirmed no acute issues. Patient is going to monitor over the weekend and update [place] next week ?

## 2022-02-13 ENCOUNTER — Ambulatory Visit: Payer: Medicare HMO | Admitting: Internal Medicine

## 2022-03-12 ENCOUNTER — Encounter: Payer: Self-pay | Admitting: Internal Medicine

## 2022-03-13 ENCOUNTER — Encounter: Payer: Self-pay | Admitting: Internal Medicine

## 2022-03-13 ENCOUNTER — Ambulatory Visit (INDEPENDENT_AMBULATORY_CARE_PROVIDER_SITE_OTHER): Payer: Medicare HMO | Admitting: Internal Medicine

## 2022-03-13 DIAGNOSIS — R233 Spontaneous ecchymoses: Secondary | ICD-10-CM | POA: Diagnosis not present

## 2022-03-13 DIAGNOSIS — E7800 Pure hypercholesterolemia, unspecified: Secondary | ICD-10-CM | POA: Diagnosis not present

## 2022-03-13 DIAGNOSIS — I10 Essential (primary) hypertension: Secondary | ICD-10-CM

## 2022-03-13 DIAGNOSIS — Z8673 Personal history of transient ischemic attack (TIA), and cerebral infarction without residual deficits: Secondary | ICD-10-CM | POA: Diagnosis not present

## 2022-03-13 DIAGNOSIS — R7989 Other specified abnormal findings of blood chemistry: Secondary | ICD-10-CM

## 2022-03-13 DIAGNOSIS — D508 Other iron deficiency anemias: Secondary | ICD-10-CM

## 2022-03-13 NOTE — Telephone Encounter (Signed)
Pt scheduled for today at 3:00.   ?

## 2022-03-13 NOTE — Progress Notes (Signed)
Patient ID: Angel Floyd, female   DOB: 07-Sep-1951, 71 y.o.   MRN: [id number] ? ? ?Subjective:  ? ? Patient ID: Angel Floyd, female    DOB: Mar 29, 1951, 71 y.o.   MRN: [id number] ? ?This visit occurred during the SARS-CoV-2 public health emergency.  Safety protocols were in place, including screening questions prior to the visit, additional usage of staff PPE, and extensive cleaning of exam room while observing appropriate contact time as indicated for disinfecting solutions.  ? ?Patient here for work in appt.  ? ?Chief Complaint  ?Patient presents with  ? Follow-up  ?  F/u for bruising  ? .  ? ?HPI ?Has noticed increased bruising recently.  No injury.  No trauma.  Unaware of any triggers.  Bruising/hematomas noted right lower abdomen, arms, legs and suprapubic region.  No nosebleeds.  No gum bleeding.  No rectal bleeding or blood in urine.  Breathing stable. No chest pain.  Eating well. No increased cough or congestion.  ? ? ?Past Medical History:  ?Diagnosis Date  ? Anemia   ? Breast cancer ([place])   ? COVID-19 08/05/2019  ? Diabetes mellitus ([place])   ? Dyspnea   ? better since recovery from COVID  ? GERD (gastroesophageal reflux disease)   ? Heart murmur   ? History of abnormal Pap smear   ? class III, required cryosurgery  ? Hypercholesterolemia   ? Hypertension   ? TIA (transient ischemic attack)   ? 2008, 2018. no deficit  ? ?Past Surgical History:  ?Procedure Laterality Date  ? BREAST CYST ASPIRATION  1989  ? COLONOSCOPY WITH PROPOFOL N/A 11/05/2017  ? Procedure: COLONOSCOPY WITH PROPOFOL;  Surgeon: Lollie Sails, MD;  Location: [facility] ENDOSCOPY;  Service: Endoscopy;  Laterality: N/A;  ? DILATION AND CURETTAGE OF UTERUS    ? ESOPHAGOGASTRODUODENOSCOPY (EGD) WITH PROPOFOL N/A 11/05/2017  ? Procedure: ESOPHAGOGASTRODUODENOSCOPY (EGD) WITH PROPOFOL;  Surgeon: Lollie Sails, MD;  Location: [facility] ENDOSCOPY;  Service: Endoscopy;  Laterality: N/A;  ? FOOT SURGERY    ? morton's neuroma removed - right foot  ?  FOOT SURGERY  10/2009  ? achilles tendon release with reconstructive surgery  ? GYNECOLOGIC CRYOSURGERY    ? class III pap  ? HAND SURGERY    ? cyst removed - left hand  ? hysteroscopy and D&C  7/09  ? KNEE ARTHROSCOPY Left [id number]  ? removed tear & resurfaced area  ? KNEE SURGERY  09/20/12  ? torn meniscus, (Dr Leanor Kail)  ? LOOP RECORDER INSERTION N/A 12/28/2020  ? Procedure: LOOP RECORDER INSERTION;  Surgeon: Isaias Cowman, MD;  Location: [place] CV LAB;  Service: Cardiovascular;  Laterality: N/A;  ? SHOULDER ARTHROSCOPY WITH ROTATOR CUFF REPAIR Right 01/26/2020  ? Procedure: SHOULDER ARTHROSCOPY WITH ROTATOR CUFF REPAIR with distal clavicle resection, biceps tnotomy, subacromial decompression;  Surgeon: Lovell Sheehan, MD;  Location: [place];  Service: Orthopedics;  Laterality: Right;  Diabetic - oral meds  ? TUBAL LIGATION    ? ?Family History  ?Problem Relation Age of Onset  ? Lung cancer Father   ? Alcohol abuse Father   ? Cancer Father   ?     lung  ? CVA Mother   ? Colon cancer Mother   ? Alcohol abuse Mother   ? Cancer Mother   ?     colon  ? Stroke Mother   ? Colon cancer Sister   ? Diabetes Maternal Grandfather   ? ?Social History  ? ?Socioeconomic  History  ? Marital status: Married  ?  Spouse name: Not on file  ? Number of children: 2  ? Years of education: Not on file  ? Highest education level: Not on file  ?Occupational History  ? Not on file  ?Tobacco Use  ? Smoking status: Never  ? Smokeless tobacco: Never  ?Vaping Use  ? Vaping Use: Never used  ?Substance and Sexual Activity  ? Alcohol use: No  ?  Alcohol/week: 0.0 standard drinks  ? Drug use: No  ? Sexual activity: Not Currently  ?Other Topics Concern  ? Not on file  ?Social History Narrative  ? Not on file  ? ?Social Determinants of Health  ? ?Financial Resource Strain: Not on file  ?Food Insecurity: Not on file  ?Transportation Needs: Not on file  ?Physical Activity: Not on file  ?Stress: Not on file  ?Social  Connections: Not on file  ? ? ? ?Review of Systems  ?Constitutional:  Negative for appetite change and unexpected weight change.  ?HENT:  Negative for congestion and sinus pressure.   ?Respiratory:  Negative for cough, chest tightness and shortness of breath.   ?Cardiovascular:  Negative for chest pain, palpitations and leg swelling.  ?Gastrointestinal:  Negative for abdominal pain, diarrhea, nausea and vomiting.  ?Genitourinary:  Negative for difficulty urinating and dysuria.  ?Musculoskeletal:  Negative for joint swelling and myalgias.  ?Skin:  Negative for color change and rash.  ?Neurological:  Negative for dizziness, light-headedness and headaches.  ?Psychiatric/Behavioral:  Negative for agitation and dysphoric mood.   ? ?   ?Objective:  ?  ? ?BP 122/60 (BP Location: Left Arm, Patient Position: Sitting, Cuff Size: Small)   Pulse 72   Temp 98.1 ?F (36.7 ?C) (Temporal)   Resp 14   Ht '5\' 5"'$  (1.651 m)   Wt 150 lb (68 kg)   SpO2 99%   BMI 24.96 kg/m?  ?Wt Readings from Last 3 Encounters:  ?03/13/22 150 lb (68 kg)  ?01/09/22 150 lb (68 kg)  ?11/25/21 148 lb 9.6 oz (67.4 kg)  ? ? ?Physical Exam ?Vitals reviewed.  ?Constitutional:   ?   General: She is not in acute distress. ?   Appearance: Normal appearance.  ?HENT:  ?   Head: Normocephalic and atraumatic.  ?   Right Ear: External ear normal.  ?   Left Ear: External ear normal.  ?Eyes:  ?   General: No scleral icterus.    ?   Right eye: No discharge.     ?   Left eye: No discharge.  ?   Conjunctiva/sclera: Conjunctivae normal.  ?Neck:  ?   Thyroid: No thyromegaly.  ?Cardiovascular:  ?   Rate and Rhythm: Normal rate and regular rhythm.  ?Pulmonary:  ?   Effort: No respiratory distress.  ?   Breath sounds: Normal breath sounds. No wheezing.  ?Abdominal:  ?   General: Bowel sounds are normal.  ?   Palpations: Abdomen is soft.  ?   Tenderness: There is no abdominal tenderness.  ?Musculoskeletal:     ?   General: No swelling or tenderness.  ?   Cervical back: Neck  supple. No tenderness.  ?Lymphadenopathy:  ?   Cervical: No cervical adenopathy.  ?Skin: ?   Findings: No erythema or rash.  ?Neurological:  ?   Mental Status: She is alert.  ?Psychiatric:     ?   Mood and Affect: Mood normal.     ?   Behavior: Behavior normal.  ? ? ? ?  Outpatient Encounter Medications as of 03/13/2022  ?Medication Sig  ? ASPIRIN 81 PO Take 81 mg by mouth daily.  ? cholecalciferol (VITAMIN D) 25 MCG (1000 UNIT) tablet Take 1,000 Units by mouth daily.  ? clopidogrel (PLAVIX) 75 MG tablet Take 75 mg by mouth daily.  ? Fe Fum-FePoly-Vit C-Vit B3 (INTEGRA) 62.5-62.5-40-3 MG CAPS Take 1 capsule by mouth every other day.  ? fish oil-omega-3 fatty acids 1000 MG capsule Take 2 g by mouth daily.   ? glucose blood test strip Use as instructed to check blood sugars three times daily. Has One Touch Ultra glucometer. Dx 250.00  ? meclizine (ANTIVERT) 25 MG tablet Take 1 tablet (25 mg total) by mouth 3 (three) times daily as needed for dizziness.  ? metFORMIN (GLUCOPHAGE) 500 MG tablet Take 1 tablet (500 mg total) by mouth 2 (two) times daily.  ? pantoprazole (PROTONIX) 40 MG tablet Take 1 tablet (40 mg total) by mouth daily.  ? rosuvastatin (CRESTOR) 20 MG tablet TAKE 1 TABLET DAILY  ? telmisartan (MICARDIS) 20 MG tablet Take 1 tablet (20 mg total) by mouth daily.  ? tiZANidine (ZANAFLEX) 4 MG tablet Take 1 tablet (4 mg total) by mouth at bedtime.  ? ?No facility-administered encounter medications on file as of 03/13/2022.  ?  ? ?Lab Results  ?Component Value Date  ? WBC 7.1 03/13/2022  ? HGB 12.8 03/13/2022  ? HCT 38.0 03/13/2022  ? PLT 212.0 03/13/2022  ? GLUCOSE 128 (H) 03/13/2022  ? CHOL 143 11/23/2021  ? TRIG 59.0 11/23/2021  ? HDL 52.40 11/23/2021  ? [place] 79 11/23/2021  ? ALT 18 03/13/2022  ? AST 19 03/13/2022  ? NA 142 03/13/2022  ? K 3.7 03/13/2022  ? CL 107 03/13/2022  ? CREATININE 0.73 03/13/2022  ? BUN 20 03/13/2022  ? CO2 27 03/13/2022  ? TSH 1.67 11/23/2021  ? INR 1.0 03/13/2022  ? HGBA1C 7.0 (H)  11/23/2021  ? MICROALBUR 1.9 03/17/2021  ? ? ?MR BRAIN WO CONTRAST ? ?Result Date: 01/09/2022 ?CLINICAL DATA:  71 year old female with dizziness, headache, nausea, vision changes. EXAM: MRI HEAD WITHOUT CONTRAST TECHNIQUE

## 2022-03-13 NOTE — Telephone Encounter (Signed)
Pt called in stating she would like to know if she can be seen today with Dr. Nicki Reaper before she leave to go out of town.... Pt requesting callback  ?

## 2022-03-14 LAB — BASIC METABOLIC PANEL
BUN: 20 mg/dL (ref 6–23)
CO2: 27 mEq/L (ref 19–32)
Calcium: 9.4 mg/dL (ref 8.4–10.5)
Chloride: 107 mEq/L (ref 96–112)
Creatinine, Ser: 0.73 mg/dL (ref 0.40–1.20)
GFR: 83.54 mL/min (ref >60.00)
Glucose, Bld: 128 mg/dL — ABNORMAL HIGH (ref 70–99)
Potassium: 3.7 mEq/L (ref 3.5–5.1)
Sodium: 142 mEq/L (ref 135–145)

## 2022-03-14 LAB — VITAMIN B12: Vitamin B-12: 323 pg/mL (ref 211–911)

## 2022-03-14 LAB — PROTIME-INR
INR: 1
Prothrombin Time: 9.8 s (ref 9.0–11.5)

## 2022-03-14 LAB — CBC WITH DIFFERENTIAL/PLATELET
Basophils Absolute: 0 10*3/uL (ref 0.0–0.1)
Basophils Relative: 0.3 % (ref 0.0–3.0)
Eosinophils Absolute: 0.3 10*3/uL (ref 0.0–0.7)
Eosinophils Relative: 3.6 % (ref 0.0–5.0)
HCT: 38 % (ref 36.0–46.0)
Hemoglobin: 12.8 g/dL (ref 12.0–15.0)
Lymphocytes Relative: 17.9 % (ref 12.0–46.0)
Lymphs Abs: 1.3 10*3/uL (ref 0.7–4.0)
MCHC: 33.7 g/dL (ref 30.0–36.0)
MCV: 90.2 fl (ref 78.0–100.0)
Monocytes Absolute: 0.4 10*3/uL (ref 0.1–1.0)
Monocytes Relative: 6.3 % (ref 3.0–12.0)
Neutro Abs: 5.1 10*3/uL (ref 1.4–7.7)
Neutrophils Relative %: 71.9 % (ref 43.0–77.0)
Platelets: 212 10*3/uL (ref 150.0–400.0)
RBC: 4.21 Mil/uL (ref 3.87–5.11)
RDW: 13 % (ref 11.5–15.5)
WBC: 7.1 10*3/uL (ref 4.0–10.5)

## 2022-03-14 LAB — HEPATIC FUNCTION PANEL
ALT: 18 U/L (ref 0–35)
AST: 19 U/L (ref 0–37)
Albumin: 4.4 g/dL (ref 3.5–5.2)
Alkaline Phosphatase: 58 U/L (ref 39–117)
Bilirubin, Direct: 0.1 mg/dL (ref 0.0–0.3)
Total Bilirubin: 0.5 mg/dL (ref 0.2–1.2)
Total Protein: 6.3 g/dL (ref 6.0–8.3)

## 2022-03-14 LAB — APTT: aPTT: 26 s (ref 23–32)

## 2022-03-19 ENCOUNTER — Encounter: Payer: Self-pay | Admitting: Internal Medicine

## 2022-03-19 NOTE — Assessment & Plan Note (Signed)
Follow cbc.  

## 2022-03-19 NOTE — Assessment & Plan Note (Signed)
On aspirin and plavix.  Increased bruising.  Check routine labs - to confirm normal platelet count and hemoglobin  and check pt/inr.   ?

## 2022-03-19 NOTE — Assessment & Plan Note (Signed)
Increased bruising noted over the last 6 weeks.  No known triggers.  On aspirin and plavix.  Discussed need to continue.  No other bleeding.  Check cbc, liver panel and pt/inr.  Follow.   ?

## 2022-03-19 NOTE — Assessment & Plan Note (Signed)
Continue micardis.  Follow pressures.  

## 2022-03-19 NOTE — Assessment & Plan Note (Signed)
Recheck liver panel to confirm wnl.  

## 2022-03-22 ENCOUNTER — Ambulatory Visit: Payer: Medicare HMO | Admitting: Internal Medicine

## 2022-03-23 ENCOUNTER — Other Ambulatory Visit (INDEPENDENT_AMBULATORY_CARE_PROVIDER_SITE_OTHER): Payer: Medicare HMO

## 2022-03-23 DIAGNOSIS — E1149 Type 2 diabetes mellitus with other diabetic neurological complication: Secondary | ICD-10-CM

## 2022-03-23 DIAGNOSIS — E7800 Pure hypercholesterolemia, unspecified: Secondary | ICD-10-CM

## 2022-03-23 LAB — LIPID PANEL
Cholesterol: 147 mg/dL (ref 0–200)
HDL: 55.7 mg/dL (ref >39.00)
LDL Cholesterol: 79 mg/dL (ref 0–99)
NonHDL: 91.36
Total CHOL/HDL Ratio: 3
Triglycerides: 60 mg/dL (ref 0.0–149.0)
VLDL: 12 mg/dL (ref 0.0–40.0)

## 2022-03-23 LAB — MICROALBUMIN / CREATININE URINE RATIO
Creatinine,U: 119.9 mg/dL
Microalb Creat Ratio: 0.7 mg/g (ref 0.0–30.0)
Microalb, Ur: 0.9 mg/dL (ref 0.0–1.9)

## 2022-03-23 LAB — HEMOGLOBIN A1C: Hgb A1c MFr Bld: 6.7 % — ABNORMAL HIGH (ref 4.6–6.5)

## 2022-03-27 ENCOUNTER — Encounter: Payer: Self-pay | Admitting: Internal Medicine

## 2022-03-27 ENCOUNTER — Ambulatory Visit (INDEPENDENT_AMBULATORY_CARE_PROVIDER_SITE_OTHER): Payer: Medicare HMO | Admitting: Internal Medicine

## 2022-03-27 VITALS — BP 122/70 | HR 79 | Temp 98.4°F | Resp 12 | Ht 65.0 in | Wt 147.0 lb

## 2022-03-27 DIAGNOSIS — Z8673 Personal history of transient ischemic attack (TIA), and cerebral infarction without residual deficits: Secondary | ICD-10-CM

## 2022-03-27 DIAGNOSIS — E7800 Pure hypercholesterolemia, unspecified: Secondary | ICD-10-CM | POA: Diagnosis not present

## 2022-03-27 DIAGNOSIS — R233 Spontaneous ecchymoses: Secondary | ICD-10-CM

## 2022-03-27 DIAGNOSIS — Z0000 Encounter for general adult medical examination without abnormal findings: Secondary | ICD-10-CM | POA: Diagnosis not present

## 2022-03-27 DIAGNOSIS — Z853 Personal history of malignant neoplasm of breast: Secondary | ICD-10-CM | POA: Diagnosis not present

## 2022-03-27 DIAGNOSIS — I10 Essential (primary) hypertension: Secondary | ICD-10-CM | POA: Diagnosis not present

## 2022-03-27 DIAGNOSIS — E1149 Type 2 diabetes mellitus with other diabetic neurological complication: Secondary | ICD-10-CM

## 2022-03-27 DIAGNOSIS — D508 Other iron deficiency anemias: Secondary | ICD-10-CM

## 2022-03-27 NOTE — Assessment & Plan Note (Addendum)
Physical today 03/27/22.  PAP 09/2017 - negative with negative HPV.  Colonoscopy 11/2016 .  Mammogram 11/07/21 -ok.  ?

## 2022-03-27 NOTE — Progress Notes (Signed)
Patient ID: Angel Floyd, female   DOB: [birth year], 71 y.o.   MRN: [id number] ? ? ?Subjective:  ? ? Patient ID: Angel Floyd, female    DOB: 30-Oct-1951, 71 y.o.   MRN: [id number] ? ?This visit occurred during the SARS-CoV-2 public health emergency.  Safety protocols were in place, including screening questions prior to the visit, additional usage of staff PPE, and extensive cleaning of exam room while observing appropriate contact time as indicated for disinfecting solutions.  ? ?Patient here for her physical exam.  ? ?Chief Complaint  ?Patient presents with  ? Follow-up  ?  Yearly CPE  ? .  ? ?HPI ?She is doing relatively well.  Bruising is better.  Tries to stay active.  Plans to start walking more.  No chest pain or sob reported.  No increased heart rate or palpitations reported.  No abdominal pain.  Bowels moving.  Off eliquis - secondary to increased nosebleeds.  No other bleeding.  Watching her diet.   ? ? ?Past Medical History:  ?Diagnosis Date  ? Anemia   ? Breast cancer ([place])   ? COVID-19 08/05/2019  ? Diabetes mellitus ([place])   ? Dyspnea   ? better since recovery from COVID  ? GERD (gastroesophageal reflux disease)   ? Heart murmur   ? History of abnormal Pap smear   ? class III, required cryosurgery  ? Hypercholesterolemia   ? Hypertension   ? TIA (transient ischemic attack)   ? 2008, 2018. no deficit  ? ?Past Surgical History:  ?Procedure Laterality Date  ? BREAST CYST ASPIRATION  1989  ? COLONOSCOPY WITH PROPOFOL N/A 11/05/2017  ? Procedure: COLONOSCOPY WITH PROPOFOL;  Surgeon: Lollie Sails, MD;  Location: [facility] ENDOSCOPY;  Service: Endoscopy;  Laterality: N/A;  ? DILATION AND CURETTAGE OF UTERUS    ? ESOPHAGOGASTRODUODENOSCOPY (EGD) WITH PROPOFOL N/A 11/05/2017  ? Procedure: ESOPHAGOGASTRODUODENOSCOPY (EGD) WITH PROPOFOL;  Surgeon: Lollie Sails, MD;  Location: [facility] ENDOSCOPY;  Service: Endoscopy;  Laterality: N/A;  ? FOOT SURGERY    ? morton's neuroma removed - right foot  ? FOOT SURGERY   10/2009  ? achilles tendon release with reconstructive surgery  ? GYNECOLOGIC CRYOSURGERY    ? class III pap  ? HAND SURGERY    ? cyst removed - left hand  ? hysteroscopy and D&C  7/09  ? KNEE ARTHROSCOPY Left [id number]  ? removed tear & resurfaced area  ? KNEE SURGERY  09/20/12  ? torn meniscus, (Dr Leanor Kail)  ? LOOP RECORDER INSERTION N/A 12/28/2020  ? Procedure: LOOP RECORDER INSERTION;  Surgeon: Isaias Cowman, MD;  Location: [place] CV LAB;  Service: Cardiovascular;  Laterality: N/A;  ? SHOULDER ARTHROSCOPY WITH ROTATOR CUFF REPAIR Right 01/26/2020  ? Procedure: SHOULDER ARTHROSCOPY WITH ROTATOR CUFF REPAIR with distal clavicle resection, biceps tnotomy, subacromial decompression;  Surgeon: Lovell Sheehan, MD;  Location: [place];  Service: Orthopedics;  Laterality: Right;  Diabetic - oral meds  ? TUBAL LIGATION    ? ?Family History  ?Problem Relation Age of Onset  ? Lung cancer Father   ? Alcohol abuse Father   ? Cancer Father   ?     lung  ? CVA Mother   ? Colon cancer Mother   ? Alcohol abuse Mother   ? Cancer Mother   ?     colon  ? Stroke Mother   ? Colon cancer Sister   ? Diabetes Maternal Grandfather   ? ?Social History  ? ?  Socioeconomic History  ? Marital status: Married  ?  Spouse name: Not on file  ? Number of children: 2  ? Years of education: Not on file  ? Highest education level: Not on file  ?Occupational History  ? Not on file  ?Tobacco Use  ? Smoking status: Never  ? Smokeless tobacco: Never  ?Vaping Use  ? Vaping Use: Never used  ?Substance and Sexual Activity  ? Alcohol use: No  ?  Alcohol/week: 0.0 standard drinks  ? Drug use: No  ? Sexual activity: Not Currently  ?Other Topics Concern  ? Not on file  ?Social History Narrative  ? Not on file  ? ?Social Determinants of Health  ? ?Financial Resource Strain: Not on file  ?Food Insecurity: Not on file  ?Transportation Needs: Not on file  ?Physical Activity: Not on file  ?Stress: Not on file  ?Social Connections: Not on  file  ? ? ? ?Review of Systems  ?Constitutional:  Negative for appetite change and unexpected weight change.  ?HENT:  Negative for congestion, sinus pressure and sore throat.   ?Eyes:  Negative for pain and visual disturbance.  ?Respiratory:  Negative for cough, chest tightness and shortness of breath.   ?Cardiovascular:  Negative for chest pain, palpitations and leg swelling.  ?Gastrointestinal:  Negative for abdominal pain, diarrhea, nausea and vomiting.  ?Genitourinary:  Negative for difficulty urinating and dysuria.  ?Musculoskeletal:  Negative for joint swelling and myalgias.  ?Skin:  Negative for color change and rash.  ?     Bruising - better.   ?Neurological:  Negative for dizziness, light-headedness and headaches.  ?Hematological:  Negative for adenopathy. Does not bruise/bleed easily.  ?Psychiatric/Behavioral:  Negative for dysphoric mood.   ? ?   ?Objective:  ?  ? ?BP 122/70 (BP Location: Left Arm, Patient Position: Sitting, Cuff Size: Small)   Pulse 79   Temp 98.4 ?F (36.9 ?C) (Temporal)   Resp 12   Ht '5\' 5"'$  (1.651 m)   Wt 147 lb (66.7 kg)   SpO2 99%   BMI 24.46 kg/m?  ?Wt Readings from Last 3 Encounters:  ?03/27/22 147 lb (66.7 kg)  ?03/13/22 150 lb (68 kg)  ?01/09/22 150 lb (68 kg)  ? ? ?Physical Exam ?Vitals reviewed.  ?Constitutional:   ?   General: She is not in acute distress. ?   Appearance: Normal appearance. She is well-developed.  ?HENT:  ?   Head: Normocephalic and atraumatic.  ?   Right Ear: External ear normal.  ?   Left Ear: External ear normal.  ?Eyes:  ?   General: No scleral icterus.    ?   Right eye: No discharge.     ?   Left eye: No discharge.  ?   Conjunctiva/sclera: Conjunctivae normal.  ?Neck:  ?   Thyroid: No thyromegaly.  ?Cardiovascular:  ?   Rate and Rhythm: Normal rate and regular rhythm.  ?Pulmonary:  ?   Effort: No tachypnea, accessory muscle usage or respiratory distress.  ?   Breath sounds: Normal breath sounds. No decreased breath sounds or wheezing.  ?Chest:   ?Breasts: ?   Right: No inverted nipple, mass, nipple discharge or tenderness (no axillary adenopathy).  ?   Left: No inverted nipple, mass, nipple discharge or tenderness (no axilarry adenopathy).  ?Abdominal:  ?   General: Bowel sounds are normal.  ?   Palpations: Abdomen is soft.  ?   Tenderness: There is no abdominal tenderness.  ?Musculoskeletal:     ?  General: No swelling or tenderness.  ?   Cervical back: Neck supple.  ?Lymphadenopathy:  ?   Cervical: No cervical adenopathy.  ?Skin: ?   Findings: No erythema or rash.  ?Neurological:  ?   Mental Status: She is alert and oriented to person, place, and time.  ?Psychiatric:     ?   Mood and Affect: Mood normal.     ?   Behavior: Behavior normal.  ? ? ? ?Outpatient Encounter Medications as of 03/27/2022  ?Medication Sig  ? ASPIRIN 81 PO Take 81 mg by mouth daily.  ? cholecalciferol (VITAMIN D) 25 MCG (1000 UNIT) tablet Take 1,000 Units by mouth daily.  ? clopidogrel (PLAVIX) 75 MG tablet Take 75 mg by mouth daily.  ? Fe Fum-FePoly-Vit C-Vit B3 (INTEGRA) 62.5-62.5-40-3 MG CAPS Take 1 capsule by mouth every other day.  ? fish oil-omega-3 fatty acids 1000 MG capsule Take 2 g by mouth daily.   ? glucose blood test strip Use as instructed to check blood sugars three times daily. Has One Touch Ultra glucometer. Dx 250.00  ? meclizine (ANTIVERT) 25 MG tablet Take 1 tablet (25 mg total) by mouth 3 (three) times daily as needed for dizziness.  ? metFORMIN (GLUCOPHAGE) 500 MG tablet Take 1 tablet (500 mg total) by mouth 2 (two) times daily.  ? pantoprazole (PROTONIX) 40 MG tablet Take 1 tablet (40 mg total) by mouth daily.  ? rosuvastatin (CRESTOR) 20 MG tablet TAKE 1 TABLET DAILY  ? telmisartan (MICARDIS) 20 MG tablet Take 1 tablet (20 mg total) by mouth daily.  ? [DISCONTINUED] tiZANidine (ZANAFLEX) 4 MG tablet Take 1 tablet (4 mg total) by mouth at bedtime. (Patient not taking: Reported on 03/27/2022)  ? ?No facility-administered encounter medications on file as of  03/27/2022.  ?  ? ?Lab Results  ?Component Value Date  ? WBC 7.1 03/13/2022  ? HGB 12.8 03/13/2022  ? HCT 38.0 03/13/2022  ? PLT 212.0 03/13/2022  ? GLUCOSE 128 (H) 03/13/2022  ? CHOL 147 03/23/2022  ? TRIG 60.0 03/23/2022

## 2022-04-02 ENCOUNTER — Encounter: Payer: Self-pay | Admitting: Internal Medicine

## 2022-04-02 NOTE — Assessment & Plan Note (Signed)
Follow cbc.  

## 2022-04-02 NOTE — Assessment & Plan Note (Signed)
On metformin bid.  Has lost weight.  Eating.  Low carb diet and exercise.  Follow met b anda 1c.  ?Lab Results  ?Component Value Date  ? HGBA1C 6.7 (H) 03/23/2022  ? ?

## 2022-04-02 NOTE — Assessment & Plan Note (Signed)
On aspirin and plavix.  Bruising better.  ?

## 2022-04-02 NOTE — Assessment & Plan Note (Signed)
Mammogram 11/07/21 - ok.   

## 2022-04-02 NOTE — Assessment & Plan Note (Signed)
Continue micardis.  Follow pressures.  

## 2022-04-02 NOTE — Assessment & Plan Note (Signed)
Continue crestor.  Low cholesterol diet and exercise. Follow lipid panel and liver function tests.   

## 2022-04-02 NOTE — Assessment & Plan Note (Signed)
Improved.  Recent labs unrevealing.  ?

## 2022-04-23 ENCOUNTER — Other Ambulatory Visit: Payer: Self-pay | Admitting: Internal Medicine

## 2022-05-08 ENCOUNTER — Encounter: Payer: Self-pay | Admitting: Internal Medicine

## 2022-06-05 ENCOUNTER — Other Ambulatory Visit: Payer: Self-pay | Admitting: Internal Medicine

## 2022-07-25 ENCOUNTER — Other Ambulatory Visit: Payer: Medicare HMO

## 2022-07-28 ENCOUNTER — Ambulatory Visit: Payer: Medicare HMO | Admitting: Internal Medicine

## 2022-08-01 ENCOUNTER — Other Ambulatory Visit (INDEPENDENT_AMBULATORY_CARE_PROVIDER_SITE_OTHER): Payer: Medicare HMO

## 2022-08-01 DIAGNOSIS — E7800 Pure hypercholesterolemia, unspecified: Secondary | ICD-10-CM | POA: Diagnosis not present

## 2022-08-01 DIAGNOSIS — E1149 Type 2 diabetes mellitus with other diabetic neurological complication: Secondary | ICD-10-CM | POA: Diagnosis not present

## 2022-08-01 DIAGNOSIS — I10 Essential (primary) hypertension: Secondary | ICD-10-CM | POA: Diagnosis not present

## 2022-08-01 LAB — LIPID PANEL
Cholesterol: 130 mg/dL (ref 0–200)
HDL: 57.3 mg/dL (ref >39.00)
LDL Cholesterol: 65 mg/dL (ref 0–99)
NonHDL: 72.9
Total CHOL/HDL Ratio: 2
Triglycerides: 40 mg/dL (ref 0.0–149.0)
VLDL: 8 mg/dL (ref 0.0–40.0)

## 2022-08-01 LAB — HEPATIC FUNCTION PANEL
ALT: 18 U/L (ref 0–35)
AST: 20 U/L (ref 0–37)
Albumin: 4.1 g/dL (ref 3.5–5.2)
Alkaline Phosphatase: 46 U/L (ref 39–117)
Bilirubin, Direct: 0.1 mg/dL (ref 0.0–0.3)
Total Bilirubin: 0.6 mg/dL (ref 0.2–1.2)
Total Protein: 6.2 g/dL (ref 6.0–8.3)

## 2022-08-01 LAB — BASIC METABOLIC PANEL
BUN: 18 mg/dL (ref 6–23)
CO2: 28 mEq/L (ref 19–32)
Calcium: 9 mg/dL (ref 8.4–10.5)
Chloride: 106 mEq/L (ref 96–112)
Creatinine, Ser: 0.72 mg/dL (ref 0.40–1.20)
GFR: 84.71 mL/min (ref >60.00)
Glucose, Bld: 122 mg/dL — ABNORMAL HIGH (ref 70–99)
Potassium: 3.4 mEq/L — ABNORMAL LOW (ref 3.5–5.1)
Sodium: 142 mEq/L (ref 135–145)

## 2022-08-01 LAB — HEMOGLOBIN A1C: Hgb A1c MFr Bld: 7 % — ABNORMAL HIGH (ref 4.6–6.5)

## 2022-08-02 ENCOUNTER — Telehealth: Payer: Self-pay

## 2022-08-02 NOTE — Telephone Encounter (Signed)
error 

## 2022-08-04 ENCOUNTER — Ambulatory Visit: Payer: Medicare HMO | Admitting: Internal Medicine

## 2022-08-04 VITALS — BP 128/80 | HR 65 | Temp 97.5°F | Resp 17 | Ht 65.5 in | Wt 146.2 lb

## 2022-08-04 DIAGNOSIS — E876 Hypokalemia: Secondary | ICD-10-CM

## 2022-08-04 DIAGNOSIS — E7800 Pure hypercholesterolemia, unspecified: Secondary | ICD-10-CM

## 2022-08-04 DIAGNOSIS — I10 Essential (primary) hypertension: Secondary | ICD-10-CM

## 2022-08-04 DIAGNOSIS — E1149 Type 2 diabetes mellitus with other diabetic neurological complication: Secondary | ICD-10-CM

## 2022-08-04 DIAGNOSIS — Z853 Personal history of malignant neoplasm of breast: Secondary | ICD-10-CM

## 2022-08-04 DIAGNOSIS — Z8673 Personal history of transient ischemic attack (TIA), and cerebral infarction without residual deficits: Secondary | ICD-10-CM

## 2022-08-04 NOTE — Progress Notes (Unsigned)
Patient ID: Angel Floyd, female   DOB: 1951-03-27, 71 y.o.   MRN: [id number]   Subjective:    Patient ID: Angel Floyd, female    DOB: May 21, 1951, 71 y.o.   MRN: [id number]   Patient here for  Chief Complaint  Patient presents with   Follow-up    4 mth f/u-doing well, no new concerns   .   HPI Here to follow up regarding afib, hypercholesterolemia and hypertension.  S/p watchman procedure 01/2022.  Continues on aspirin.  Staying active.  No chest pain or sob reported.  No abdominal pain.  Bowels moving.  Discussed labs.  A1c increased 7.0.  discussed additional medication.  She desires to hold on additional medication at this time.  Wants to work on diet and exercise.  Potassium slightly decreased.  Discussed.    Past Medical History:  Diagnosis Date   Anemia    Breast cancer ([place])    COVID-19 08/05/2019   Diabetes mellitus ([place])    Dyspnea    better since recovery from COVID   GERD (gastroesophageal reflux disease)    Heart murmur    History of abnormal Pap smear    class III, required cryosurgery   Hypercholesterolemia    Hypertension    TIA (transient ischemic attack)    2008, 2018. no deficit   Past Surgical History:  Procedure Laterality Date   BREAST CYST ASPIRATION  1989   COLONOSCOPY WITH PROPOFOL N/A 11/05/2017   Procedure: COLONOSCOPY WITH PROPOFOL;  Surgeon: Lollie Sails, MD;  Location: [facility] ENDOSCOPY;  Service: Endoscopy;  Laterality: N/A;   DILATION AND CURETTAGE OF UTERUS     ESOPHAGOGASTRODUODENOSCOPY (EGD) WITH PROPOFOL N/A 11/05/2017   Procedure: ESOPHAGOGASTRODUODENOSCOPY (EGD) WITH PROPOFOL;  Surgeon: Lollie Sails, MD;  Location: [facility] ENDOSCOPY;  Service: Endoscopy;  Laterality: N/A;   FOOT SURGERY     morton's neuroma removed - right foot   FOOT SURGERY  10/2009   achilles tendon release with reconstructive surgery   GYNECOLOGIC CRYOSURGERY     class III pap   HAND SURGERY     cyst removed - left hand   hysteroscopy and D&C  7/09    KNEE ARTHROSCOPY Left [id number]   removed tear & resurfaced area   KNEE SURGERY  09/20/12   torn meniscus, (Dr Leanor Kail)   [place] N/A 12/28/2020   Procedure: LOOP RECORDER INSERTION;  Surgeon: Isaias Cowman, MD;  Location: [place] [facility];  Service: Cardiovascular;  Laterality: N/A;   SHOULDER ARTHROSCOPY WITH ROTATOR CUFF REPAIR Right 01/26/2020   Procedure: SHOULDER ARTHROSCOPY WITH ROTATOR CUFF REPAIR with distal clavicle resection, biceps tnotomy, subacromial decompression;  Surgeon: Lovell Sheehan, MD;  Location: [place];  Service: Orthopedics;  Laterality: Right;  Diabetic - oral meds   TUBAL LIGATION     Family History  Problem Relation Age of Onset   Lung cancer Father    Alcohol abuse Father    Cancer Father        lung   CVA Mother    Colon cancer Mother    Alcohol abuse Mother    Cancer Mother        colon   Stroke Mother    Colon cancer Sister    Diabetes Maternal Grandfather    Social History   Socioeconomic History   Marital status: Married    Spouse name: Not on file   Number of children: 2   Years of education: Not on file  Highest education level: Not on file  Occupational History   Not on file  Tobacco Use   Smoking status: Never   Smokeless tobacco: Never  Vaping Use   Vaping Use: Never used  Substance and Sexual Activity   Alcohol use: No    Alcohol/week: 0.0 standard drinks of alcohol   Drug use: No   Sexual activity: Not Currently  Other Topics Concern   Not on file  Social History Narrative   Not on file   Social Determinants of Health   Financial Resource Strain: Low Risk  (06/09/2019)   Overall Financial Resource Strain (CARDIA)    Difficulty of Paying Living Expenses: Not hard at all  Food Insecurity: No Food Insecurity (06/09/2019)   Hunger Vital Sign    Worried About Running Out of Food in the Last Year: Never true    Ran Out of Food in the Last Year: Never true  Transportation Needs: No  Transportation Needs (06/09/2019)   PRAPARE - Hydrologist (Medical): No    Lack of Transportation (Non-Medical): No  Physical Activity: Not on file  Stress: No Stress Concern Present (06/09/2019)   [place]    Feeling of Stress : Only a little  Social Connections: Unknown (06/09/2020)   Social Connection and Isolation Panel [NHANES]    Frequency of Communication with Friends and Family: More than three times a week    Frequency of Social Gatherings with Friends and Family: More than three times a week    Attends Religious Services: Not on Advertising copywriter or Organizations: Not on file    Attends Archivist Meetings: Not on file    Marital Status: Married     Review of Systems  Constitutional:  Negative for appetite change and unexpected weight change.  HENT:  Negative for congestion and sinus pressure.   Respiratory:  Negative for cough, chest tightness and shortness of breath.   Cardiovascular:  Negative for chest pain, palpitations and leg swelling.  Gastrointestinal:  Negative for abdominal pain, diarrhea, nausea and vomiting.  Genitourinary:  Negative for difficulty urinating and dysuria.  Musculoskeletal:  Negative for joint swelling and myalgias.  Skin:  Negative for color change and rash.  Neurological:  Negative for dizziness, light-headedness and headaches.  Psychiatric/Behavioral:  Negative for agitation and dysphoric mood.        Objective:     BP 128/80   Pulse 65   Temp (!) 97.5 F (36.4 C) (Oral)   Resp 17   Ht 5' 5.5" (1.664 m)   Wt 146 lb 4 oz (66.3 kg)   SpO2 97%   BMI 23.97 kg/m  Wt Readings from Last 3 Encounters:  08/03/22 146 lb 4 oz (66.3 kg)  03/27/22 147 lb (66.7 kg)  03/13/22 150 lb (68 kg)    Physical Exam Vitals reviewed.  Constitutional:      General: She is not in acute distress.    Appearance: Normal appearance.   HENT:     Head: Normocephalic and atraumatic.     Right Ear: External ear normal.     Left Ear: External ear normal.  Eyes:     General: No scleral icterus.       Right eye: No discharge.        Left eye: No discharge.     Conjunctiva/sclera: Conjunctivae normal.  Neck:     Thyroid: No thyromegaly.  Cardiovascular:     Rate and Rhythm: Normal rate and regular rhythm.  Pulmonary:     Effort: No respiratory distress.     Breath sounds: Normal breath sounds. No wheezing.  Abdominal:     General: Bowel sounds are normal.     Palpations: Abdomen is soft.     Tenderness: There is no abdominal tenderness.  Musculoskeletal:        General: No swelling or tenderness.     Cervical back: Neck supple. No tenderness.  Lymphadenopathy:     Cervical: No cervical adenopathy.  Skin:    Findings: No erythema or rash.  Neurological:     Mental Status: She is alert.  Psychiatric:        Mood and Affect: Mood normal.        Behavior: Behavior normal.      Outpatient Encounter Medications as of 08/04/2022  Medication Sig   ASPIRIN 81 PO Take 81 mg by mouth daily.   cholecalciferol (VITAMIN D) 25 MCG (1000 UNIT) tablet Take 1,000 Units by mouth daily.   Fe Fum-FePoly-Vit C-Vit B3 (INTEGRA) 62.5-62.5-40-3 MG CAPS Take 1 capsule by mouth every other day.   fish oil-omega-3 fatty acids 1000 MG capsule Take 2 g by mouth daily.    glucose blood test strip Use as instructed to check blood sugars three times daily. Has One Touch Ultra glucometer. Dx 250.00   meclizine (ANTIVERT) 25 MG tablet Take 1 tablet (25 mg total) by mouth 3 (three) times daily as needed for dizziness.   metFORMIN (GLUCOPHAGE) 500 MG tablet TAKE 1 TABLET TWICE A DAY   pantoprazole (PROTONIX) 40 MG tablet Take 1 tablet (40 mg total) by mouth daily.   rosuvastatin (CRESTOR) 20 MG tablet TAKE 1 TABLET DAILY   telmisartan (MICARDIS) 20 MG tablet Take 1 tablet (20 mg total) by mouth daily.   [DISCONTINUED] clopidogrel (PLAVIX) 75  MG tablet Take 75 mg by mouth daily. (Patient not taking: Reported on 08/04/2022)   No facility-administered encounter medications on file as of 08/04/2022.     Lab Results  Component Value Date   WBC 7.1 03/13/2022   HGB 12.8 03/13/2022   HCT 38.0 03/13/2022   PLT 212.0 03/13/2022   GLUCOSE 122 (H) 08/01/2022   CHOL 130 08/01/2022   TRIG 40.0 08/01/2022   HDL 57.30 08/01/2022   LDLCALC 65 08/01/2022   ALT 18 08/01/2022   AST 20 08/01/2022   NA 142 08/01/2022   K 3.4 (L) 08/01/2022   CL 106 08/01/2022   CREATININE 0.72 08/01/2022   BUN 18 08/01/2022   CO2 28 08/01/2022   TSH 1.67 11/23/2021   INR 1.0 03/13/2022   HGBA1C 7.0 (H) 08/01/2022   MICROALBUR 0.9 03/23/2022    MR BRAIN WO CONTRAST  Result Date: 01/09/2022 CLINICAL DATA:  71 year old female with dizziness, headache, nausea, vision changes. EXAM: MRI HEAD WITHOUT CONTRAST TECHNIQUE: Multiplanar, multiecho pulse sequences of the brain and surrounding structures were obtained without intravenous contrast. COMPARISON:  Brain MRI and intracranial MRA 11/29/2020. FINDINGS: Brain: No restricted diffusion to suggest acute infarction. No midline shift, mass effect, evidence of mass lesion, ventriculomegaly, extra-axial collection or acute intracranial hemorrhage. Cervicomedullary junction and pituitary are within normal limits. 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 No cortical encephalomalacia, chronic cerebral blood products, or new signal abnormality identified. Vascular: Major intracranial vascular flow voids are stable. Dominant appearing left vertebral artery again noted.  Skull and upper cervical spine: Negative. Bone marrow signal is stable and within normal limits. Sinuses/Orbits: Stable and negative. Other: Trace mastoid air cell fluid is stable and appears inconsequential. Stable and grossly normal visible  internal auditory structures. Negative visible scalp and face. IMPRESSION: 1. No acute intracranial abnormality. 2. Stable noncontrast MRI appearance of the brain since last year. 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 Electronically Signed   By: Genevie Ann M.D.   On: 01/09/2022 10:12       Assessment & Plan:   Problem List Items Addressed This Visit     History of breast cancer    Mammogram 11/07/21 - ok.        History of TIA (transient ischemic attack)    On aspirin only now.  Cardiology stopped plavix.  Follow. Continue risk factor modification.      Hypercholesterolemia    Continue crestor.  Low cholesterol diet and exercise.  Follow lipid panel and liver function tests.        Hypertension    Continue micardis.  Follow pressures.        Hypokalemia - Primary    Discussed. Given information regarding foods with increased potassium.  Recheck potassium in a couple of weeks.        Relevant Orders   Potassium   Type 2 diabetes mellitus with neurological complications ([place])    On metformin bid.  Low carb diet and exercise.  Follow met b anda 1c. Discussed other treatment options and adjustment of medication.  She declines at this time.  Wants to work on diet and exercise.   Lab Results  Component Value Date   HGBA1C 7.0 (H) 08/01/2022         Einar Pheasant, MD

## 2022-08-05 ENCOUNTER — Encounter: Payer: Self-pay | Admitting: Internal Medicine

## 2022-08-05 DIAGNOSIS — E876 Hypokalemia: Secondary | ICD-10-CM | POA: Insufficient documentation

## 2022-08-05 NOTE — Assessment & Plan Note (Signed)
Continue crestor.  Low cholesterol diet and exercise. Follow lipid panel and liver function tests.   

## 2022-08-05 NOTE — Assessment & Plan Note (Addendum)
On metformin bid.  Low carb diet and exercise.  Follow met b anda 1c. Discussed other treatment options and adjustment of medication.  She declines at this time.  Wants to work on diet and exercise.   Lab Results  Component Value Date   HGBA1C 7.0 (H) 08/01/2022

## 2022-08-05 NOTE — Assessment & Plan Note (Signed)
On aspirin only now.  Cardiology stopped plavix.  Follow. Continue risk factor modification.

## 2022-08-05 NOTE — Assessment & Plan Note (Signed)
Mammogram 11/07/21 - ok.

## 2022-08-05 NOTE — Assessment & Plan Note (Signed)
Discussed. Given information regarding foods with increased potassium.  Recheck potassium in a couple of weeks.

## 2022-08-05 NOTE — Assessment & Plan Note (Signed)
Continue micardis.  Follow pressures.

## 2022-08-25 ENCOUNTER — Other Ambulatory Visit (INDEPENDENT_AMBULATORY_CARE_PROVIDER_SITE_OTHER): Payer: Medicare HMO

## 2022-08-25 DIAGNOSIS — E876 Hypokalemia: Secondary | ICD-10-CM

## 2022-08-25 LAB — POTASSIUM: Potassium: 4 mEq/L (ref 3.5–5.1)

## 2022-09-18 ENCOUNTER — Encounter (INDEPENDENT_AMBULATORY_CARE_PROVIDER_SITE_OTHER): Payer: Self-pay

## 2022-09-19 ENCOUNTER — Telehealth: Payer: Self-pay | Admitting: Internal Medicine

## 2022-09-19 ENCOUNTER — Other Ambulatory Visit: Payer: Self-pay

## 2022-09-19 DIAGNOSIS — Z1211 Encounter for screening for malignant neoplasm of colon: Secondary | ICD-10-CM

## 2022-09-19 NOTE — Telephone Encounter (Signed)
Appointment Request From: Glenice Bow   With Provider: Einar Pheasant, MD [facility]]   Preferred Date Range: 09/27/2022 - 11/17/2022   Preferred Times: Any Time   Reason for visit: Request an Appointment   Comments: We had talked at my last appointment about scheduling for my 5 year Colonoscopy. Would like to schedule now. Thanks, [employer]

## 2022-09-20 NOTE — Telephone Encounter (Signed)
Order placed for referral to [facility] [facility].  Dr Tiffany Kocher has retired.  Referred to Dr Haig Prophet.

## 2022-09-20 NOTE — Addendum Note (Signed)
Addended by: Alisa Graff on: 09/20/2022 03:41 AM   Modules accepted: Orders

## 2022-10-25 ENCOUNTER — Telehealth: Payer: Self-pay | Admitting: Internal Medicine

## 2022-10-25 ENCOUNTER — Other Ambulatory Visit: Payer: Self-pay

## 2022-10-25 MED ORDER — METFORMIN HCL 500 MG PO TABS: 500.0000 mg | ORAL_TABLET | Freq: Two times a day (BID) | ORAL | 1 refills | Status: DC

## 2022-10-25 MED ORDER — ROSUVASTATIN CALCIUM 20 MG PO TABS: 20.0000 mg | ORAL_TABLET | Freq: Every day | ORAL | 1 refills | Status: DC

## 2022-10-25 MED ORDER — TELMISARTAN 20 MG PO TABS: 20.0000 mg | ORAL_TABLET | Freq: Every day | ORAL | 3 refills | Status: DC

## 2022-10-25 MED ORDER — PANTOPRAZOLE SODIUM 40 MG PO TBEC: 40.0000 mg | DELAYED_RELEASE_TABLET | Freq: Every day | ORAL | 3 refills | Status: DC

## 2022-10-25 NOTE — Telephone Encounter (Signed)
Pt need a refill on metformin, rosuvastatin, telmisartan and pantoprazole sent to [facility]

## 2022-10-25 NOTE — Telephone Encounter (Signed)
sent 

## 2022-11-08 LAB — HM MAMMOGRAPHY

## 2022-11-10 ENCOUNTER — Encounter: Payer: Self-pay | Admitting: Internal Medicine

## 2022-11-22 ENCOUNTER — Other Ambulatory Visit: Payer: Self-pay

## 2022-11-23 ENCOUNTER — Telehealth: Payer: Self-pay | Admitting: Internal Medicine

## 2022-11-23 ENCOUNTER — Other Ambulatory Visit: Payer: Self-pay

## 2022-11-23 DIAGNOSIS — E7800 Pure hypercholesterolemia, unspecified: Secondary | ICD-10-CM

## 2022-11-23 DIAGNOSIS — I10 Essential (primary) hypertension: Secondary | ICD-10-CM

## 2022-11-23 DIAGNOSIS — E1149 Type 2 diabetes mellitus with other diabetic neurological complication: Secondary | ICD-10-CM

## 2022-11-23 NOTE — Telephone Encounter (Signed)
Patient has a lab appt 11/29/2022, there are no orders in. 

## 2022-11-23 NOTE — Telephone Encounter (Signed)
Orders placed.

## 2022-11-24 NOTE — Addendum Note (Signed)
Addended by: Alisa Graff on: 11/24/2022 02:01 AM   Modules accepted: Orders

## 2022-11-24 NOTE — Telephone Encounter (Signed)
TSH also added to met b, A1c, liver and lipid.

## 2022-11-29 ENCOUNTER — Other Ambulatory Visit (INDEPENDENT_AMBULATORY_CARE_PROVIDER_SITE_OTHER): Payer: Medicare HMO

## 2022-11-29 DIAGNOSIS — I10 Essential (primary) hypertension: Secondary | ICD-10-CM | POA: Diagnosis not present

## 2022-11-29 DIAGNOSIS — E1149 Type 2 diabetes mellitus with other diabetic neurological complication: Secondary | ICD-10-CM | POA: Diagnosis not present

## 2022-11-29 DIAGNOSIS — E7800 Pure hypercholesterolemia, unspecified: Secondary | ICD-10-CM

## 2022-11-29 LAB — BASIC METABOLIC PANEL
BUN: 17 mg/dL (ref 6–23)
CO2: 29 mEq/L (ref 19–32)
Calcium: 9.1 mg/dL (ref 8.4–10.5)
Chloride: 105 mEq/L (ref 96–112)
Creatinine, Ser: 0.82 mg/dL (ref 0.40–1.20)
GFR: 72.3 mL/min (ref >60.00)
Glucose, Bld: 128 mg/dL — ABNORMAL HIGH (ref 70–99)
Potassium: 3.8 mEq/L (ref 3.5–5.1)
Sodium: 145 mEq/L (ref 135–145)

## 2022-11-29 LAB — LIPID PANEL
Cholesterol: 160 mg/dL (ref 0–200)
HDL: 65.2 mg/dL (ref >39.00)
LDL Cholesterol: 81 mg/dL (ref 0–99)
NonHDL: 94.92
Total CHOL/HDL Ratio: 2
Triglycerides: 68 mg/dL (ref 0.0–149.0)
VLDL: 13.6 mg/dL (ref 0.0–40.0)

## 2022-11-29 LAB — TSH: TSH: 2.44 u[IU]/mL (ref 0.35–5.50)

## 2022-11-29 LAB — HEPATIC FUNCTION PANEL
ALT: 18 U/L (ref 0–35)
AST: 16 U/L (ref 0–37)
Albumin: 4.3 g/dL (ref 3.5–5.2)
Alkaline Phosphatase: 56 U/L (ref 39–117)
Bilirubin, Direct: 0.1 mg/dL (ref 0.0–0.3)
Total Bilirubin: 0.5 mg/dL (ref 0.2–1.2)
Total Protein: 6.3 g/dL (ref 6.0–8.3)

## 2022-11-29 LAB — HEMOGLOBIN A1C: Hgb A1c MFr Bld: 6.7 % — ABNORMAL HIGH (ref 4.6–6.5)

## 2022-12-04 ENCOUNTER — Encounter: Payer: Self-pay | Admitting: Internal Medicine

## 2022-12-04 ENCOUNTER — Ambulatory Visit: Payer: Medicare HMO | Admitting: Internal Medicine

## 2022-12-04 VITALS — BP 129/79 | HR 69 | Temp 97.9°F | Resp 14 | Ht 65.5 in | Wt 147.6 lb

## 2022-12-04 DIAGNOSIS — E1149 Type 2 diabetes mellitus with other diabetic neurological complication: Secondary | ICD-10-CM

## 2022-12-04 DIAGNOSIS — Z853 Personal history of malignant neoplasm of breast: Secondary | ICD-10-CM

## 2022-12-04 DIAGNOSIS — I10 Essential (primary) hypertension: Secondary | ICD-10-CM | POA: Diagnosis not present

## 2022-12-04 DIAGNOSIS — R7989 Other specified abnormal findings of blood chemistry: Secondary | ICD-10-CM

## 2022-12-04 DIAGNOSIS — D508 Other iron deficiency anemias: Secondary | ICD-10-CM

## 2022-12-04 DIAGNOSIS — Z8673 Personal history of transient ischemic attack (TIA), and cerebral infarction without residual deficits: Secondary | ICD-10-CM

## 2022-12-04 DIAGNOSIS — E7800 Pure hypercholesterolemia, unspecified: Secondary | ICD-10-CM

## 2022-12-04 DIAGNOSIS — Z1231 Encounter for screening mammogram for malignant neoplasm of breast: Secondary | ICD-10-CM

## 2022-12-04 MED ORDER — ROSUVASTATIN CALCIUM 20 MG PO TABS: 20.0000 mg | ORAL_TABLET | Freq: Every day | ORAL | 3 refills | Status: DC

## 2022-12-04 NOTE — Assessment & Plan Note (Signed)
Mammogram 11/08/22 - [facility] II.

## 2022-12-04 NOTE — Assessment & Plan Note (Signed)
Continue crestor.  Low cholesterol diet and exercise. Follow lipid panel and liver function tests.   

## 2022-12-04 NOTE — Assessment & Plan Note (Signed)
On metformin bid.  Low carb diet and exercise.  Follow met b anda 1c. Lab Results  Component Value Date   HGBA1C 6.7 (H) 11/29/2022

## 2022-12-04 NOTE — Assessment & Plan Note (Signed)
Previous work up:  MRI/MRA brain - no acute intracranial process.  Chronic left external capsule lacunar insult.  Minimal microvascular ischemic changes.  Mild right P2 segment narrowing.  Carotid ultrasound 1-39% bilateral.  S/p loop recorder insertion.  ECHO - ok. ILR - 2 minutes of what appeared to be afib.  Was on eliquis.  She stopped eliquis on her own due to nosebleeds.  Was on aspirin and plavix. Off now.  Recommended continue aspirin.  See note.

## 2022-12-04 NOTE — Assessment & Plan Note (Signed)
Follow cbc.  

## 2022-12-04 NOTE — Assessment & Plan Note (Signed)
Recent liver panel wnl.  Continue diet and exercise.

## 2022-12-04 NOTE — Assessment & Plan Note (Signed)
Continue micardis.  Follow pressures.  Follow metabolic panel.

## 2022-12-04 NOTE — Progress Notes (Signed)
Patient ID: Angel Floyd, female   DOB: 08-06-1951, 72 y.o.   MRN: [id number]   Subjective:    Patient ID: Angel Floyd, female    DOB: 19-Jan-1951, 72 y.o.   MRN: [id number]   Patient here for  Chief Complaint  Patient presents with   Medical Management of Chronic Issues   Diabetes   Hypertension   .   HPI Here to follow up regarding afib, hypercholesterolemia and hypertension.  S/p watchman procedure 01/2022.  Continues on aspirin.  Staying active.  No chest pain or sob reported.  No abdominal pain.  Bowels moving.  Discussed labs.  A1c increased 6.7.  saw Dr Ubaldo Glassing 10/10/22.  Previous functional study - no ischemia.  . Previous heart palpitations-ILR showed 2 minutes of probable A. fib. Now with Watchman device in place. Her CHA2DS2 - VASc is 2. No changes made.  Felt stable.  She is planning to start going to the gym.    Past Medical History:  Diagnosis Date   Anemia    Breast cancer ([place])    COVID-19 08/05/2019   Diabetes mellitus ([place])    Dyspnea    better since recovery from COVID   GERD (gastroesophageal reflux disease)    Heart murmur    History of abnormal Pap smear    class III, required cryosurgery   Hypercholesterolemia    Hypertension    TIA (transient ischemic attack)    2008, 2018. no deficit   Past Surgical History:  Procedure Laterality Date   BREAST CYST ASPIRATION  1989   COLONOSCOPY WITH PROPOFOL N/A 11/05/2017   Procedure: COLONOSCOPY WITH PROPOFOL;  Surgeon: Lollie Sails, MD;  Location: [facility] ENDOSCOPY;  Service: Endoscopy;  Laterality: N/A;   DILATION AND CURETTAGE OF UTERUS     ESOPHAGOGASTRODUODENOSCOPY (EGD) WITH PROPOFOL N/A 11/05/2017   Procedure: ESOPHAGOGASTRODUODENOSCOPY (EGD) WITH PROPOFOL;  Surgeon: Lollie Sails, MD;  Location: [facility] ENDOSCOPY;  Service: Endoscopy;  Laterality: N/A;   FOOT SURGERY     morton's neuroma removed - right foot   FOOT SURGERY  10/2009   achilles tendon release with reconstructive surgery   GYNECOLOGIC  CRYOSURGERY     class III pap   HAND SURGERY     cyst removed - left hand   hysteroscopy and D&C  7/09   KNEE ARTHROSCOPY Left [id number]   removed tear & resurfaced area   KNEE SURGERY  09/20/12   torn meniscus, (Dr Leanor Kail)   [place] N/A 12/28/2020   Procedure: LOOP RECORDER INSERTION;  Surgeon: Isaias Cowman, MD;  Location: [place] [facility];  Service: Cardiovascular;  Laterality: N/A;   SHOULDER ARTHROSCOPY WITH ROTATOR CUFF REPAIR Right 01/26/2020   Procedure: SHOULDER ARTHROSCOPY WITH ROTATOR CUFF REPAIR with distal clavicle resection, biceps tnotomy, subacromial decompression;  Surgeon: Lovell Sheehan, MD;  Location: [place];  Service: Orthopedics;  Laterality: Right;  Diabetic - oral meds   TUBAL LIGATION     Family History  Problem Relation Age of Onset   Lung cancer Father    Alcohol abuse Father    Cancer Father        lung   CVA Mother    Colon cancer Mother    Alcohol abuse Mother    Cancer Mother        colon   Stroke Mother    Colon cancer Sister    Diabetes Maternal Grandfather    Social History   Socioeconomic History   Marital status: Married  Spouse name: Not on file   Number of children: 2   Years of education: Not on file   Highest education level: Not on file  Occupational History   Not on file  Tobacco Use   Smoking status: Never   Smokeless tobacco: Never  Vaping Use   Vaping Use: Never used  Substance and Sexual Activity   Alcohol use: No    Alcohol/week: 0.0 standard drinks of alcohol   Drug use: No   Sexual activity: Not Currently  Other Topics Concern   Not on file  Social History Narrative   Not on file   Social Determinants of Health   Financial Resource Strain: Low Risk  (06/09/2019)   Overall Financial Resource Strain (CARDIA)    Difficulty of Paying Living Expenses: Not hard at all  Food Insecurity: No Food Insecurity (06/09/2019)   Hunger Vital Sign    Worried About Running Out of  Food in the Last Year: Never true    [place] in the Last Year: Never true  Transportation Needs: No Transportation Needs (06/09/2019)   PRAPARE - Hydrologist (Medical): No    Lack of Transportation (Non-Medical): No  Physical Activity: Not on file  Stress: No Stress Concern Present (06/09/2019)   [place]    Feeling of Stress : Only a little  Social Connections: Unknown (06/09/2020)   Social Connection and Isolation Panel [NHANES]    Frequency of Communication with Friends and Family: More than three times a week    Frequency of Social Gatherings with Friends and Family: More than three times a week    Attends Religious Services: Not on Advertising copywriter or Organizations: Not on file    Attends Archivist Meetings: Not on file    Marital Status: Married     Review of Systems  Constitutional:  Negative for appetite change and unexpected weight change.  HENT:  Negative for congestion and sinus pressure.   Respiratory:  Negative for cough, chest tightness and shortness of breath.   Cardiovascular:  Negative for chest pain, palpitations and leg swelling.  Gastrointestinal:  Negative for abdominal pain, diarrhea, nausea and vomiting.  Genitourinary:  Negative for difficulty urinating and dysuria.  Musculoskeletal:  Negative for joint swelling and myalgias.  Skin:  Negative for color change and rash.  Neurological:  Negative for dizziness and headaches.  Psychiatric/Behavioral:  Negative for agitation and dysphoric mood.        Objective:     BP 129/79 (BP Location: Left Arm, Patient Position: Sitting, Cuff Size: Small)   Pulse 69   Temp 97.9 F (36.6 C) (Temporal)   Resp 14   Ht 5' 5.5" (1.664 m)   Wt 147 lb 9.6 oz (67 kg)   SpO2 97%   BMI 24.19 kg/m  Wt Readings from Last 3 Encounters:  12/04/22 147 lb 9.6 oz (67 kg)  08/03/22 146 lb 4 oz (66.3 kg)   03/27/22 147 lb (66.7 kg)    Physical Exam Vitals reviewed.  Constitutional:      General: She is not in acute distress.    Appearance: Normal appearance.  HENT:     Head: Normocephalic and atraumatic.     Right Ear: External ear normal.     Left Ear: External ear normal.  Eyes:     General: No scleral icterus.       Right  eye: No discharge.        Left eye: No discharge.     Conjunctiva/sclera: Conjunctivae normal.  Neck:     Thyroid: No thyromegaly.  Cardiovascular:     Rate and Rhythm: Normal rate and regular rhythm.  Pulmonary:     Effort: No respiratory distress.     Breath sounds: Normal breath sounds. No wheezing.  Abdominal:     General: Bowel sounds are normal.     Palpations: Abdomen is soft.     Tenderness: There is no abdominal tenderness.  Musculoskeletal:        General: No swelling or tenderness.     Cervical back: Neck supple. No tenderness.  Lymphadenopathy:     Cervical: No cervical adenopathy.  Skin:    Findings: No erythema or rash.  Neurological:     Mental Status: She is alert.  Psychiatric:        Mood and Affect: Mood normal.        Behavior: Behavior normal.      Outpatient Encounter Medications as of 12/04/2022  Medication Sig   ASPIRIN 81 PO Take 81 mg by mouth daily.   cholecalciferol (VITAMIN D) 25 MCG (1000 UNIT) tablet Take 1,000 Units by mouth daily.   Fe Fum-FePoly-Vit C-Vit B3 (INTEGRA) 62.5-62.5-40-3 MG CAPS Take 1 capsule by mouth every other day.   fish oil-omega-3 fatty acids 1000 MG capsule Take 2 g by mouth daily.    glucose blood test strip Use as instructed to check blood sugars three times daily. Has One Touch Ultra glucometer. Dx 250.00   meclizine (ANTIVERT) 25 MG tablet Take 1 tablet (25 mg total) by mouth 3 (three) times daily as needed for dizziness.   metFORMIN (GLUCOPHAGE) 500 MG tablet Take 1 tablet (500 mg total) by mouth 2 (two) times daily.   pantoprazole (PROTONIX) 40 MG tablet Take 1 tablet (40 mg total) by  mouth daily.   rosuvastatin (CRESTOR) 20 MG tablet Take 1 tablet (20 mg total) by mouth daily.   telmisartan (MICARDIS) 20 MG tablet Take 1 tablet (20 mg total) by mouth daily.   [DISCONTINUED] rosuvastatin (CRESTOR) 20 MG tablet Take 1 tablet (20 mg total) by mouth daily.   No facility-administered encounter medications on file as of 12/04/2022.     Lab Results  Component Value Date   WBC 7.1 03/13/2022   HGB 12.8 03/13/2022   HCT 38.0 03/13/2022   PLT 212.0 03/13/2022   GLUCOSE 128 (H) 11/29/2022   CHOL 160 11/29/2022   TRIG 68.0 11/29/2022   HDL 65.20 11/29/2022   LDLCALC 81 11/29/2022   ALT 18 11/29/2022   AST 16 11/29/2022   NA 145 11/29/2022   K 3.8 11/29/2022   CL 105 11/29/2022   CREATININE 0.82 11/29/2022   BUN 17 11/29/2022   CO2 29 11/29/2022   TSH 2.44 11/29/2022   INR 1.0 03/13/2022   HGBA1C 6.7 (H) 11/29/2022   MICROALBUR 0.9 03/23/2022    MR BRAIN WO CONTRAST  Result Date: 01/09/2022 CLINICAL DATA:  72 year old female with dizziness, headache, nausea, vision changes. EXAM: MRI HEAD WITHOUT CONTRAST TECHNIQUE: Multiplanar, multiecho pulse sequences of the brain and surrounding structures were obtained without intravenous contrast. COMPARISON:  Brain MRI and intracranial MRA 11/29/2020. FINDINGS: Brain: No restricted diffusion to suggest acute infarction. No midline shift, mass effect, evidence of mass lesion, ventriculomegaly, extra-axial collection or acute intracranial hemorrhage. Cervicomedullary junction and pituitary are within normal limits. Chronic left lentiform/anterior external capsule lacunar infarct is stable  along with additional mild nonspecific white matter T2 and FLAIR hyperintensity. No cortical encephalomalacia, chronic cerebral blood products, or new signal abnormality identified. Vascular: Major intracranial vascular flow voids are stable. Dominant appearing left vertebral artery again noted. Skull and upper cervical spine: Negative. Bone marrow  signal is stable and within normal limits. Sinuses/Orbits: Stable and negative. Other: Trace mastoid air cell fluid is stable and appears inconsequential. Stable and grossly normal visible internal auditory structures. Negative visible scalp and face. IMPRESSION: 1. No acute intracranial abnormality. 2. Stable noncontrast MRI appearance of the brain since last year. 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 Electronically Signed   By: Genevie Ann M.D.   On: 01/09/2022 10:12       Assessment & Plan:   Problem List Items Addressed This Visit     Abnormal liver function test    Recent liver panel wnl.  Continue diet and exercise.       Anemia    Follow cbc.       History of breast cancer    Mammogram 11/08/22 - [facility].        History of stroke (Chronic)    Previous work up:  MRI/MRA brain - no acute intracranial process.  Chronic left external capsule lacunar insult.  Minimal microvascular ischemic changes.  Mild right P2 segment narrowing.  Carotid ultrasound 1-39% bilateral.  S/p loop recorder insertion.  ECHO - ok. ILR - 2 minutes of what appeared to be afib.  Was on eliquis.  She stopped eliquis on her own due to nosebleeds.  Was on aspirin and plavix. Off now.  Recommended continue aspirin.  See note.          Hypercholesterolemia    Continue crestor.  Low cholesterol diet and exercise.  Follow lipid panel and liver function tests.        Relevant Medications   rosuvastatin (CRESTOR) 20 MG tablet   Other Relevant Orders   Lipid panel   Hepatic function panel   Hypertension    Continue micardis.  Follow pressures.  Follow metabolic panel.       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  Low carb diet and exercise.  Follow met b anda 1c. Lab Results  Component Value Date    HGBA1C 6.7 (H) 11/29/2022       Relevant Medications   rosuvastatin (CRESTOR) 20 MG tablet   Other Relevant Orders   Hemoglobin Z6X   Basic metabolic panel   Other Visit Diagnoses     Encounter for screening mammogram for malignant neoplasm of breast       Relevant Orders   MM 3D SCREEN BREAST BILATERAL        Einar Pheasant, MD

## 2022-12-06 LAB — HM DIABETES EYE EXAM

## 2022-12-25 ENCOUNTER — Encounter: Payer: Self-pay | Admitting: Family Medicine

## 2022-12-25 ENCOUNTER — Telehealth (INDEPENDENT_AMBULATORY_CARE_PROVIDER_SITE_OTHER): Payer: Medicare HMO | Admitting: Family Medicine

## 2022-12-25 ENCOUNTER — Encounter: Payer: Self-pay | Admitting: Internal Medicine

## 2022-12-25 VITALS — Temp 97.4°F

## 2022-12-25 DIAGNOSIS — J321 Chronic frontal sinusitis: Secondary | ICD-10-CM

## 2022-12-25 DIAGNOSIS — J329 Chronic sinusitis, unspecified: Secondary | ICD-10-CM | POA: Insufficient documentation

## 2022-12-25 MED ORDER — FLUTICASONE PROPIONATE 50 MCG/ACT NA SUSP: 2.0000 | Freq: Every day | NASAL | 6 refills | Status: DC

## 2022-12-25 MED ORDER — BENZONATATE 200 MG PO CAPS: 200.0000 mg | ORAL_CAPSULE | Freq: Two times a day (BID) | ORAL | 0 refills | Status: DC | PRN

## 2022-12-25 MED ORDER — SACCHAROMYCES BOULARDII 250 MG PO CAPS: 250.0000 mg | ORAL_CAPSULE | Freq: Every day | ORAL | 0 refills | Status: DC

## 2022-12-25 MED ORDER — AMOXICILLIN-POT CLAVULANATE 875-125 MG PO TABS: 1.0000 | ORAL_TABLET | Freq: Two times a day (BID) | ORAL | 0 refills | Status: AC

## 2022-12-25 NOTE — Assessment & Plan Note (Addendum)
Suspect viral etiology given mild improvement with Mucinex and Tussionex. Given 1 week symptoms will treat with Augmentin x 5 days Restart Flonase Tessalon Perles for cough Probiotics while on antibiotic Increase hydration Humidified air and nasal flushing as needed.

## 2022-12-25 NOTE — Progress Notes (Signed)
Virtual Visit via Video note  I connected with Angel Floyd on 12/25/22 at [place] by video and verified that I am speaking with the correct person using two identifiers. Angel Floyd is currently located at home and husband, Simona Huh, is currently with her during visit. The provider, Carollee Leitz, MD is located in their office at time of visit.  I discussed the limitations, risks, security and privacy concerns of performing an evaluation and management service by video and the availability of in person appointments. I also discussed with the patient that there may be a patient responsible charge related to this service. The patient expressed understanding and agreed to proceed.  Subjective: PCP: Einar Pheasant, MD  Chief Complaint  Patient presents with   Cough    Right ear pain Tooth Pain Coughing Sneezing Bleeding out both nostrils when blowing nose since Thursday    Cough  Symptoms started 1 week ago.  Nasal congestion, rhinorrhea, painful right ear.  Endorses nasal pressure on right side and right-sided headache.  Also endorses right ear pain without discharge.  Today teeth are hurting everywhere.  Nonproductive cough although cough seems to be worsening.  Denies any fevers, wheezing, shortness of breath, chest pain abdominal pain, nausea/vomiting or diarrhea.  Denies any loss of hearing or tinnitus..  When blowing nose having streaks of blood but none continuous.  Denies any sick contacts.  Has been taking Mucinex and Tussionex with mild relief.  No home COVID testing.  ROS: Per HPI  Current Outpatient Medications:    amoxicillin-clavulanate (AUGMENTIN) 875-125 MG tablet, Take 1 tablet by mouth 2 (two) times daily for 5 days., Disp: 10 tablet, Rfl: 0   ASPIRIN 81 PO, Take 81 mg by mouth daily., Disp: , Rfl:    benzonatate (TESSALON) 200 MG capsule, Take 1 capsule (200 mg total) by mouth 2 (two) times daily as needed for cough., Disp: 20 capsule, Rfl: 0   cholecalciferol  (VITAMIN D) 25 MCG (1000 UNIT) tablet, Take 1,000 Units by mouth daily., Disp: , Rfl:    Fe Fum-FePoly-Vit C-Vit B3 (INTEGRA) 62.5-62.5-40-3 MG CAPS, Take 1 capsule by mouth every other day., Disp: 45 capsule, Rfl: 3   fish oil-omega-3 fatty acids 1000 MG capsule, Take 2 g by mouth daily. , Disp: , Rfl:    fluticasone (FLONASE) 50 MCG/ACT nasal spray, Place 2 sprays into both nostrils daily., Disp: 16 g, Rfl: 6   glucose blood test strip, Use as instructed to check blood sugars three times daily. Has One Touch Ultra glucometer. Dx 250.00, Disp: 100 each, Rfl: 2   meclizine (ANTIVERT) 25 MG tablet, Take 1 tablet (25 mg total) by mouth 3 (three) times daily as needed for dizziness., Disp: 30 tablet, Rfl: 0   metFORMIN (GLUCOPHAGE) 500 MG tablet, Take 1 tablet (500 mg total) by mouth 2 (two) times daily., Disp: 180 tablet, Rfl: 1   pantoprazole (PROTONIX) 40 MG tablet, Take 1 tablet (40 mg total) by mouth daily., Disp: 90 tablet, Rfl: 3   rosuvastatin (CRESTOR) 20 MG tablet, Take 1 tablet (20 mg total) by mouth daily., Disp: 90 tablet, Rfl: 3   saccharomyces boulardii (FLORASTOR) 250 MG capsule, Take 1 capsule (250 mg total) by mouth daily., Disp: 90 capsule, Rfl: 0   telmisartan (MICARDIS) 20 MG tablet, Take 1 tablet (20 mg total) by mouth daily., Disp: 90 tablet, Rfl: 3  Observations/Objective: Physical Exam Pulmonary:     Effort: Pulmonary effort is normal.  Neurological:     Mental  Status: She is alert and oriented to person, place, and time. Mental status is at baseline.  Psychiatric:        Mood and Affect: Mood normal.        Behavior: Behavior normal.        Thought Content: Thought content normal.        Judgment: Judgment normal.    Assessment and Plan: Frontal sinusitis, unspecified chronicity Assessment & Plan: Suspect viral etiology given mild improvement with Mucinex and Tussionex. Given 1 week symptoms will treat with Augmentin x 5 days Restart Flonase Tessalon Perles for  cough Probiotics while on antibiotic Increase hydration Humidified air and nasal flushing as needed.   Other orders -     Benzonatate; Take 1 capsule (200 mg total) by mouth 2 (two) times daily as needed for cough.  Dispense: 20 capsule; Refill: 0 -     Fluticasone Propionate; Place 2 sprays into both nostrils daily.  Dispense: 16 g; Refill: 6 -     Amoxicillin-Pot Clavulanate; Take 1 tablet by mouth 2 (two) times daily for 5 days.  Dispense: 10 tablet; Refill: 0 -     Saccharomyces boulardii; Take 1 capsule (250 mg total) by mouth daily.  Dispense: 90 capsule; Refill: 0    Follow Up Instructions: Return if symptoms worsen or fail to improve.   I discussed the assessment and treatment plan with the patient. The patient was provided an opportunity to ask questions and all were answered. The patient agreed with the plan and demonstrated an understanding of the instructions.   The patient was advised to call back or seek an in-person evaluation if the symptoms worsen or if the condition fails to improve as anticipated.  The above assessment and management plan was discussed with the patient. The patient verbalized understanding of and has agreed to the management plan. Patient is aware to call the clinic if symptoms persist or worsen. Patient is aware when to return to the clinic for a follow-up visit. Patient educated on when it is appropriate to go to the emergency department.   PDMP reviewed Carollee Leitz, MD

## 2022-12-25 NOTE — Patient Instructions (Addendum)
It was a pleasure meeting you today. Thank you for allowing me to take part in your health care.  Our goals for today as we discussed include:  Start Tessalon Perles 200 mg 1 tablet 3 times a day Refill Flonase 2 sprays once daily Start Augmentin 875-125 mg 1 tablet 2 times a day x 5 days Start probiotic daily with antibiotics and continue for 2 weeks after completion of antibiotics. Continue nasal saline flushes as needed Encouraged hydration  If any worsening symptoms, shortness of breath, chest pain, fever 100.4 or greater notify MD  I have someone take you to the emergency department.  If you have any questions or concerns, please do not hesitate to call the office at [phone].  I look forward to our next visit and until then take care and stay safe.  Regards,   Carollee Leitz, MD   [facility]

## 2022-12-27 ENCOUNTER — Encounter: Payer: Self-pay | Admitting: Family Medicine

## 2022-12-27 NOTE — Telephone Encounter (Signed)
Please call and confirm doing ok.  If nosebleed, then hold on using flonase.  Let us know if persistent problems.

## 2022-12-27 NOTE — Telephone Encounter (Signed)
Pt advised will hold on flonase

## 2023-01-11 ENCOUNTER — Encounter: Payer: Self-pay | Admitting: Internal Medicine

## 2023-01-16 ENCOUNTER — Other Ambulatory Visit: Payer: Self-pay

## 2023-01-16 MED ORDER — PANTOPRAZOLE SODIUM 40 MG PO TBEC: 40.0000 mg | DELAYED_RELEASE_TABLET | Freq: Every day | ORAL | 3 refills | Status: DC

## 2023-03-12 ENCOUNTER — Encounter: Payer: Self-pay | Admitting: Internal Medicine

## 2023-03-26 ENCOUNTER — Other Ambulatory Visit (INDEPENDENT_AMBULATORY_CARE_PROVIDER_SITE_OTHER): Payer: Medicare HMO

## 2023-03-26 DIAGNOSIS — E7800 Pure hypercholesterolemia, unspecified: Secondary | ICD-10-CM

## 2023-03-26 DIAGNOSIS — E1149 Type 2 diabetes mellitus with other diabetic neurological complication: Secondary | ICD-10-CM

## 2023-03-26 DIAGNOSIS — I10 Essential (primary) hypertension: Secondary | ICD-10-CM | POA: Diagnosis not present

## 2023-03-26 LAB — CBC WITH DIFFERENTIAL/PLATELET
Basophils Absolute: 0 10*3/uL (ref 0.0–0.1)
Basophils Relative: 1.1 % (ref 0.0–3.0)
Eosinophils Absolute: 0.3 10*3/uL (ref 0.0–0.7)
Eosinophils Relative: 7 % — ABNORMAL HIGH (ref 0.0–5.0)
HCT: 40.5 % (ref 36.0–46.0)
Hemoglobin: 13.4 g/dL (ref 12.0–15.0)
Lymphocytes Relative: 23.8 % (ref 12.0–46.0)
Lymphs Abs: 1 10*3/uL (ref 0.7–4.0)
MCHC: 33.2 g/dL (ref 30.0–36.0)
MCV: 90.8 fl (ref 78.0–100.0)
Monocytes Absolute: 0.4 10*3/uL (ref 0.1–1.0)
Monocytes Relative: 10.4 % (ref 3.0–12.0)
Neutro Abs: 2.5 10*3/uL (ref 1.4–7.7)
Neutrophils Relative %: 57.7 % (ref 43.0–77.0)
Platelets: 168 10*3/uL (ref 150.0–400.0)
RBC: 4.46 Mil/uL (ref 3.87–5.11)
RDW: 12.9 % (ref 11.5–15.5)
WBC: 4.3 10*3/uL (ref 4.0–10.5)

## 2023-03-26 LAB — BASIC METABOLIC PANEL
BUN: 13 mg/dL (ref 6–23)
CO2: 30 mEq/L (ref 19–32)
Calcium: 9.2 mg/dL (ref 8.4–10.5)
Chloride: 104 mEq/L (ref 96–112)
Creatinine, Ser: 0.75 mg/dL (ref 0.40–1.20)
GFR: 80.29 mL/min (ref >60.00)
Glucose, Bld: 132 mg/dL — ABNORMAL HIGH (ref 70–99)
Potassium: 3.7 mEq/L (ref 3.5–5.1)
Sodium: 144 mEq/L (ref 135–145)

## 2023-03-26 LAB — LIPID PANEL
Cholesterol: 142 mg/dL (ref 0–200)
HDL: 61.9 mg/dL (ref >39.00)
LDL Cholesterol: 68 mg/dL (ref 0–99)
NonHDL: 80.13
Total CHOL/HDL Ratio: 2
Triglycerides: 63 mg/dL (ref 0.0–149.0)
VLDL: 12.6 mg/dL (ref 0.0–40.0)

## 2023-03-26 LAB — HEPATIC FUNCTION PANEL
ALT: 21 U/L (ref 0–35)
AST: 22 U/L (ref 0–37)
Albumin: 4.1 g/dL (ref 3.5–5.2)
Alkaline Phosphatase: 57 U/L (ref 39–117)
Bilirubin, Direct: 0.1 mg/dL (ref 0.0–0.3)
Total Bilirubin: 0.4 mg/dL (ref 0.2–1.2)
Total Protein: 6.3 g/dL (ref 6.0–8.3)

## 2023-03-26 LAB — HEMOGLOBIN A1C: Hgb A1c MFr Bld: 6.9 % — ABNORMAL HIGH (ref 4.6–6.5)

## 2023-04-02 ENCOUNTER — Ambulatory Visit
Admission: RE | Admit: 2023-04-02 | Discharge: 2023-04-02 | Disposition: A | Discharge status: Home / Self Care | Payer: Medicare HMO | Attending: Gastroenterology | Admitting: Gastroenterology

## 2023-04-02 ENCOUNTER — Ambulatory Visit: Payer: Medicare HMO | Admitting: Anesthesiology

## 2023-04-02 ENCOUNTER — Other Ambulatory Visit: Payer: Medicare HMO

## 2023-04-02 ENCOUNTER — Encounter
Admission: RE | Disposition: A | Discharge status: Home / Self Care | Payer: Self-pay | Source: Home / Self Care | Attending: Gastroenterology

## 2023-04-02 ENCOUNTER — Encounter: Payer: Self-pay | Admitting: *Deleted

## 2023-04-02 DIAGNOSIS — I10 Essential (primary) hypertension: Secondary | ICD-10-CM | POA: Insufficient documentation

## 2023-04-02 DIAGNOSIS — Z1211 Encounter for screening for malignant neoplasm of colon: Secondary | ICD-10-CM | POA: Insufficient documentation

## 2023-04-02 DIAGNOSIS — D759 Disease of blood and blood-forming organs, unspecified: Secondary | ICD-10-CM | POA: Diagnosis not present

## 2023-04-02 DIAGNOSIS — D649 Anemia, unspecified: Secondary | ICD-10-CM | POA: Insufficient documentation

## 2023-04-02 DIAGNOSIS — Z95818 Presence of other cardiac implants and grafts: Secondary | ICD-10-CM | POA: Insufficient documentation

## 2023-04-02 DIAGNOSIS — Z853 Personal history of malignant neoplasm of breast: Secondary | ICD-10-CM | POA: Insufficient documentation

## 2023-04-02 DIAGNOSIS — K219 Gastro-esophageal reflux disease without esophagitis: Secondary | ICD-10-CM | POA: Insufficient documentation

## 2023-04-02 DIAGNOSIS — Z800 Family history of malignant neoplasm of digestive organs: Secondary | ICD-10-CM | POA: Diagnosis not present

## 2023-04-02 DIAGNOSIS — K640 First degree hemorrhoids: Secondary | ICD-10-CM | POA: Diagnosis not present

## 2023-04-02 DIAGNOSIS — E119 Type 2 diabetes mellitus without complications: Secondary | ICD-10-CM | POA: Insufficient documentation

## 2023-04-02 HISTORY — DX: Unspecified malignant neoplasm of skin, unspecified: C44.90

## 2023-04-02 HISTORY — DX: Presence of other cardiac implants and grafts: Z95.818

## 2023-04-02 HISTORY — PX: COLONOSCOPY WITH PROPOFOL: SHX5780

## 2023-04-02 LAB — GLUCOSE, CAPILLARY: Glucose-Capillary: 95 mg/dL (ref 70–99)

## 2023-04-02 SURGERY — COLONOSCOPY WITH PROPOFOL
Anesthesia: General

## 2023-04-02 MED ORDER — SODIUM CHLORIDE 0.9 % IV SOLN: INTRAVENOUS | Status: DC

## 2023-04-02 MED ORDER — PROPOFOL 10 MG/ML IV BOLUS
INTRAVENOUS | Status: DC | PRN
  Administered 2023-04-02: 70 mg via INTRAVENOUS
  Administered 2023-04-02 (×2): 30 mg via INTRAVENOUS

## 2023-04-02 MED ORDER — PROPOFOL 500 MG/50ML IV EMUL
INTRAVENOUS | Status: DC | PRN
  Administered 2023-04-02: 140 ug/kg/min via INTRAVENOUS

## 2023-04-02 MED ORDER — LIDOCAINE HCL (CARDIAC) PF 100 MG/5ML IV SOSY
PREFILLED_SYRINGE | INTRAVENOUS | Status: DC | PRN
  Administered 2023-04-02: 20 mg via INTRAVENOUS

## 2023-04-02 NOTE — Anesthesia Postprocedure Evaluation (Signed)
Anesthesia Post Note  Patient: Angel Floyd  Procedure(s) Performed: COLONOSCOPY WITH PROPOFOL  Patient location during evaluation: PACU Anesthesia Type: General Level of consciousness: awake and alert, oriented and patient cooperative Pain management: pain level controlled Vital Signs Assessment: post-procedure vital signs reviewed and stable Respiratory status: spontaneous breathing, nonlabored ventilation and respiratory function stable Cardiovascular status: blood pressure returned to baseline and stable Postop Assessment: adequate PO intake Anesthetic complications: no   No notable events documented.   Last Vitals:  Vitals:   04/02/23 1337 04/02/23 1347  BP: (!) 104/57 131/71  Pulse: 62 71  Resp: 18 19  Temp:    SpO2: 99% 99%    Last Pain:  Vitals:   04/02/23 1347  TempSrc:   PainSc: 0-No pain                 Reed Breech

## 2023-04-02 NOTE — Op Note (Signed)
[facility] Patient Name: Angel Floyd Procedure Date: 04/02/2023 12:32 PM MRN: [id number] Account #: [id number] Date of Birth: 1951/09/22 Admit Type: Outpatient Age: 72 Room: [facility] ENDO ROOM 3 Gender: Female Note Status: Finalized Instrument Name: Prentice Docker [id number] Procedure:             Colonoscopy Indications:           Screening patient at increased risk: Family history of                         colorectal cancer in multiple 1st-degree relatives Providers:             Eather Colas MD, MD Medicines:             Monitored Anesthesia Care Complications:         No immediate complications. Procedure:             Pre-Anesthesia Assessment:                        - Prior to the procedure, a History and Physical was                         performed, and patient medications and allergies were                         reviewed. The patient is competent. The risks and                         benefits of the procedure and the sedation options and                         risks were discussed with the patient. All questions                         were answered and informed consent was obtained.                         Patient identification and proposed procedure were                         verified by the physician, the nurse, the                         anesthesiologist, the anesthetist and the technician                         in the endoscopy suite. Mental Status Examination:                         alert and oriented. Airway Examination: normal                         oropharyngeal airway and neck mobility. Respiratory                         Examination: clear to auscultation. CV Examination:                         normal. Prophylactic Antibiotics: The patient does not  require prophylactic antibiotics. Prior                         Anticoagulants: The patient has taken no anticoagulant                         or  antiplatelet agents. ASA Grade Assessment: III - A                         patient with severe systemic disease. After reviewing                         the risks and benefits, the patient was deemed in                         satisfactory condition to undergo the procedure. The                         anesthesia plan was to use monitored anesthesia care                         (MAC). Immediately prior to administration of                         medications, the patient was re-assessed for adequacy                         to receive sedatives. The heart rate, respiratory                         rate, oxygen saturations, blood pressure, adequacy of                         pulmonary ventilation, and response to care were                         monitored throughout the procedure. The physical                         status of the patient was re-assessed after the                         procedure.                        After obtaining informed consent, the colonoscope was                         passed under direct vision. Throughout the procedure,                         the patient's blood pressure, pulse, and oxygen                         saturations were monitored continuously. The                         Colonoscope was introduced through the anus and  advanced to the the cecum, identified by appendiceal                         orifice and ileocecal valve. The colonoscopy was                         somewhat difficult due to a tortuous colon. The                         patient tolerated the procedure well. The quality of                         the bowel preparation was good. The ileocecal valve,                         appendiceal orifice, and rectum were photographed. Findings:      The perianal and digital rectal examinations were normal.      Internal hemorrhoids were found during retroflexion. The hemorrhoids       were Grade I (internal hemorrhoids that  do not prolapse).      The exam was otherwise without abnormality on direct and retroflexion       views. Impression:            - Internal hemorrhoids.                        - The examination was otherwise normal on direct and                         retroflexion views.                        - No specimens collected. Recommendation:        - Discharge patient to home.                        - Resume previous diet.                        - Continue present medications.                        - Repeat colonoscopy in 5 years for surveillance.                        - Return to referring physician as previously                         scheduled. Procedure Code(s):     --- Professional ---                        Z6109, Colorectal cancer screening; colonoscopy on                         individual at high risk Diagnosis Code(s):     --- Professional ---                        Z80.0, Family history of malignant neoplasm of  digestive organs                        K64.0, First degree hemorrhoids CPT copyright 2022 [employer]. All rights reserved. The codes documented in this report are preliminary and upon coder review may  be revised to meet current compliance requirements. Eather Colas MD, MD 04/02/2023 1:31:00 PM Number of Addenda: 0 Note Initiated On: 04/02/2023 12:32 PM Scope Withdrawal Time: 0 hours 9 minutes 3 seconds  Total Procedure Duration: 0 hours 19 minutes 55 seconds  Estimated Blood Loss:  Estimated blood loss: none.      [facility]

## 2023-04-02 NOTE — Interval H&P Note (Signed)
History and Physical Interval Note:  04/02/2023 12:41 PM  Angel Floyd  has presented today for surgery, with the diagnosis of FAMILY HX OF COLON,.  The various methods of treatment have been discussed with the patient and family. After consideration of risks, benefits and other options for treatment, the patient has consented to  Procedure(s): COLONOSCOPY WITH PROPOFOL (N/A) as a surgical intervention.  The patient's history has been reviewed, patient examined, no change in status, stable for surgery.  I have reviewed the patient's chart and labs.  Questions were answered to the patient's satisfaction.     Regis Bill  Ok to proceed with colonoscopy

## 2023-04-02 NOTE — Transfer of Care (Signed)
Immediate Anesthesia Transfer of Care Note  Patient: Angel Floyd  Procedure(s) Performed: COLONOSCOPY WITH PROPOFOL  Patient Location: PACU  Anesthesia Type:General  Level of Consciousness: drowsy  Airway & Oxygen Therapy: Patient Spontanous Breathing  Post-op Assessment: Report given to RN and Post -op Vital signs reviewed and stable  Post vital signs: Reviewed and stable  Last Vitals:  Vitals Value Taken Time  BP 101/53 04/02/23 1327  Temp 97   Pulse 64 04/02/23 1328  Resp 19 04/02/23 1328  SpO2 97 % 04/02/23 1328  Vitals shown include unvalidated device data.  Last Pain:  Vitals:   04/02/23 1244  TempSrc: Temporal         Complications: No notable events documented.

## 2023-04-02 NOTE — H&P (Signed)
Outpatient short stay form Pre-procedure 04/02/2023  Regis Bill, MD  Primary Physician: Dale Elsmore, MD  Reason for visit:  Screening  History of present illness:    72 y/o lady with history of hypertension, HLD, and history of a. Fib s/p watchman who is here for screening colonoscopy. Mom and sister with colon cancer in their 89's or 77's. No blood thinners. No significant abdominal surgeries.    Current Facility-Administered Medications:    0.9 %  sodium chloride infusion, , Intravenous, Continuous, Kazden Largo, Rossie Muskrat, MD  Medications Prior to Admission  Medication Sig Dispense Refill Last Dose   pantoprazole (PROTONIX) 40 MG tablet Take 1 tablet (40 mg total) by mouth daily. 90 tablet 3 04/01/2023   rosuvastatin (CRESTOR) 20 MG tablet Take 1 tablet (20 mg total) by mouth daily. 90 tablet 3 04/01/2023   telmisartan (MICARDIS) 20 MG tablet Take 1 tablet (20 mg total) by mouth daily. 90 tablet 3 04/02/2023   ASPIRIN 81 PO Take 81 mg by mouth daily.   03/31/2023   benzonatate (TESSALON) 200 MG capsule Take 1 capsule (200 mg total) by mouth 2 (two) times daily as needed for cough. 20 capsule 0    cholecalciferol (VITAMIN D) 25 MCG (1000 UNIT) tablet Take 1,000 Units by mouth daily.   03/26/2023   Fe Fum-FePoly-Vit C-Vit B3 (INTEGRA) 62.5-62.5-40-3 MG CAPS Take 1 capsule by mouth every other day. 45 capsule 3 03/26/2023   fish oil-omega-3 fatty acids 1000 MG capsule Take 2 g by mouth daily.    03/26/2023   fluticasone (FLONASE) 50 MCG/ACT nasal spray Place 2 sprays into both nostrils daily. 16 g 6    glucose blood test strip Use as instructed to check blood sugars three times daily. Has One Touch Ultra glucometer. Dx 250.00 100 each 2    meclizine (ANTIVERT) 25 MG tablet Take 1 tablet (25 mg total) by mouth 3 (three) times daily as needed for dizziness. 30 tablet 0    metFORMIN (GLUCOPHAGE) 500 MG tablet Take 1 tablet (500 mg total) by mouth 2 (two) times daily. 180 tablet 1 03/31/2023    saccharomyces boulardii (FLORASTOR) 250 MG capsule Take 1 capsule (250 mg total) by mouth daily. 90 capsule 0      Allergies  Allergen Reactions   Aggrenox [Aspirin-Dipyridamole Er]     Photosensitivity, "felt awful"     Past Medical History:  Diagnosis Date   Anemia    Breast cancer (HCC)    COVID-19 08/05/2019   Diabetes mellitus (HCC)    Dyspnea    better since recovery from COVID   GERD (gastroesophageal reflux disease)    Heart murmur    History of abnormal Pap smear    class III, required cryosurgery   Hypercholesterolemia    Hypertension    TIA (transient ischemic attack)    2008, 2018. no deficit    Review of systems:  Otherwise negative.    Physical Exam  Gen: Alert, oriented. Appears stated age.  HEENT: PERRLA. Lungs: No respiratory distress CV: RRR Abd: soft, benign, no masses Ext: No edema    Planned procedures: Proceed with colonoscopy. The patient understands the nature of the planned procedure, indications, risks, alternatives and potential complications including but not limited to bleeding, infection, perforation, damage to internal organs and possible oversedation/side effects from anesthesia. The patient agrees and gives consent to proceed.  Please refer to procedure notes for findings, recommendations and patient disposition/instructions.     Regis Bill, MD [facility]

## 2023-04-02 NOTE — Anesthesia Preprocedure Evaluation (Addendum)
Anesthesia Evaluation  Patient identified by MRN, date of birth, ID band Patient awake    Reviewed: Allergy & Precautions, NPO status , Patient's Chart, lab work & pertinent test results  History of Anesthesia Complications Negative for: history of anesthetic complications  Airway Mallampati: II   Neck ROM: Full    Dental no notable dental hx.    Pulmonary neg pulmonary ROS   Pulmonary exam normal breath sounds clear to auscultation       Cardiovascular hypertension, + dysrhythmias (a fib s/p Watchman 02/01/22)  Rhythm:Irregular Rate:Normal  Echo 01/27/21:  NORMAL LEFT VENTRICULAR SYSTOLIC FUNCTION  NORMAL RIGHT VENTRICULAR SYSTOLIC FUNCTION  MILD VALVULAR REGURGITATION  NO VALVULAR STENOSIS  MILD MR, TR, PR  TRIVIAL AR  EF 50-55%  Closest EF: >55% (Estimated)  Aortic: TRIVIAL AR  Mitral: MILD MR  Tricuspid: MILD TR    Neuro/Psych TIA (x3)   GI/Hepatic ,GERD  ,,  Endo/Other  diabetes, Type 2    Renal/GU negative Renal ROS     Musculoskeletal   Abdominal   Peds  Hematology  (+) Blood dyscrasia, anemia Breast CA   Anesthesia Other Findings Cardiology note 10/10/22:  72 y.o. female with  ICD-10-CM ICD-9-CM  1. Atypical chest pain-etiology of chest pain is unclear. Functional study showed no ischemia. Low iron may be playing a role. She has no obvious bleeding on Eliquis. R07.89 786.59   2. Heart palpitations-ILR showed 2 minutes of probable A. fib. No with Watchman device in place. Doing well. R00.2 785.1    3. Heart murmur-does have a soft murmur as well as increasing shortness of breath. No significant abnormalities on chest x-ray or echo. R01.1 785.2   4. TIA-ILR in place. Revealed nonsustained A. fib. She is stable status post Watchman device placed by Dr. Hoy Finlay at [facility].Marland Kitchen Her CHA2DS2-VASc score is 2.  Return in about 6 months (around 04/10/2023) for Massie Maroon, [facility] [place].     Reproductive/Obstetrics                             Anesthesia Physical Anesthesia Plan  ASA: 3  Anesthesia Plan: General   Post-op Pain Management:    Induction: Intravenous  PONV Risk Score and Plan: 3 and Propofol infusion, TIVA and Treatment may vary due to age or medical condition  Airway Management Planned: Natural Airway  Additional Equipment:   Intra-op Plan:   Post-operative Plan:   Informed Consent: I have reviewed the patients History and Physical, chart, labs and discussed the procedure including the risks, benefits and alternatives for the proposed anesthesia with the patient or authorized representative who has indicated his/her understanding and acceptance.       Plan Discussed with: CRNA  Anesthesia Plan Comments: (LMA/GETA backup discussed.  Patient consented for risks of anesthesia including but not limited to:  - adverse reactions to medications - damage to eyes, teeth, lips or other oral mucosa - nerve damage due to positioning  - sore throat or hoarseness - damage to heart, brain, nerves, lungs, other parts of body or loss of life  Informed patient about role of CRNA in peri- and intra-operative care.  Patient voiced understanding.)        Anesthesia Quick Evaluation

## 2023-04-03 ENCOUNTER — Encounter: Payer: Self-pay | Admitting: Gastroenterology

## 2023-04-04 ENCOUNTER — Ambulatory Visit (INDEPENDENT_AMBULATORY_CARE_PROVIDER_SITE_OTHER): Payer: Medicare HMO | Admitting: Internal Medicine

## 2023-04-04 ENCOUNTER — Encounter: Payer: Self-pay | Admitting: Internal Medicine

## 2023-04-04 VITALS — BP 122/70 | HR 70 | Temp 98.0°F | Resp 16 | Ht 65.0 in | Wt 154.2 lb

## 2023-04-04 DIAGNOSIS — Z853 Personal history of malignant neoplasm of breast: Secondary | ICD-10-CM

## 2023-04-04 DIAGNOSIS — Z0000 Encounter for general adult medical examination without abnormal findings: Secondary | ICD-10-CM

## 2023-04-04 DIAGNOSIS — Z7984 Long term (current) use of oral hypoglycemic drugs: Secondary | ICD-10-CM | POA: Diagnosis not present

## 2023-04-04 DIAGNOSIS — I10 Essential (primary) hypertension: Secondary | ICD-10-CM

## 2023-04-04 DIAGNOSIS — Z8673 Personal history of transient ischemic attack (TIA), and cerebral infarction without residual deficits: Secondary | ICD-10-CM

## 2023-04-04 DIAGNOSIS — R7989 Other specified abnormal findings of blood chemistry: Secondary | ICD-10-CM

## 2023-04-04 DIAGNOSIS — E1149 Type 2 diabetes mellitus with other diabetic neurological complication: Secondary | ICD-10-CM | POA: Diagnosis not present

## 2023-04-04 DIAGNOSIS — S80812A Abrasion, left lower leg, initial encounter: Secondary | ICD-10-CM

## 2023-04-04 DIAGNOSIS — E7800 Pure hypercholesterolemia, unspecified: Secondary | ICD-10-CM

## 2023-04-04 DIAGNOSIS — D508 Other iron deficiency anemias: Secondary | ICD-10-CM

## 2023-04-04 LAB — HM DIABETES FOOT EXAM

## 2023-04-04 MED ORDER — METFORMIN HCL 500 MG PO TABS: 500.0000 mg | ORAL_TABLET | Freq: Two times a day (BID) | ORAL | 1 refills | Status: DC

## 2023-04-04 MED ORDER — MUPIROCIN 2 % EX OINT: 1.0000 | TOPICAL_OINTMENT | Freq: Two times a day (BID) | CUTANEOUS | 0 refills | Status: DC

## 2023-04-04 NOTE — Assessment & Plan Note (Signed)
Physical today 04/04/23.  PAP 09/2017 - negative with negative HPV.  Colonoscopy 04/02/23.   Mammogram 11/08/22 - Birads II.

## 2023-04-04 NOTE — Progress Notes (Signed)
Subjective:    Patient ID: Angel Floyd, female    DOB: [birth year], 72 y.o.   MRN: [id number]  Patient here for  Chief Complaint  Patient presents with   Annual Exam    HPI Here for a physical. S/p watchman procedure 01/2022. Continues on aspirin. Staying active. Saw Dr Lady Gary 10/10/22.  Previous functional study - no ischemia.  . Previous heart palpitations-ILR showed 2 minutes of probable A. fib. Now with Watchman device in place. Her CHA2DS2 - VASc is 2. No changes made. Has f/u with new cardiologist next week.  No chest pain or sob reported.  No cough or congestion.  No abdominal pain. Bowels moving.  Had colonoscopy this week.  Recommended f/u colonoscopy in 5 years.  She does report she hit her left lower leg - metal chair - 8 days ago.  Redness and swelling have improved.  Open wound - healing gradually.  Some minimal tenderness.  S/p removal skin cancer - right lower leg.  Healing well.     Past Medical History:  Diagnosis Date   Anemia    Breast cancer (HCC)    COVID-19 08/05/2019   Diabetes mellitus (HCC)    Dyspnea    better since recovery from COVID   GERD (gastroesophageal reflux disease)    Heart murmur    History of abnormal Pap smear    class III, required cryosurgery   Hypercholesterolemia    Hypertension    Presence of Watchman left atrial appendage closure device    Skin cancer    x3   TIA (transient ischemic attack)    2008, 2018. no deficit   Past Surgical History:  Procedure Laterality Date   BREAST CYST ASPIRATION  1989   COLONOSCOPY WITH PROPOFOL N/A 11/05/2017   Procedure: COLONOSCOPY WITH PROPOFOL;  Surgeon: Christena Deem, MD;  Location: [facility] ENDOSCOPY;  Service: Endoscopy;  Laterality: N/A;   COLONOSCOPY WITH PROPOFOL N/A 04/02/2023   Procedure: COLONOSCOPY WITH PROPOFOL;  Surgeon: Regis Bill, MD;  Location: [facility] ENDOSCOPY;  Service: Endoscopy;  Laterality: N/A;   DILATION AND CURETTAGE OF UTERUS     ESOPHAGOGASTRODUODENOSCOPY (EGD)  WITH PROPOFOL N/A 11/05/2017   Procedure: ESOPHAGOGASTRODUODENOSCOPY (EGD) WITH PROPOFOL;  Surgeon: Christena Deem, MD;  Location: [facility] ENDOSCOPY;  Service: Endoscopy;  Laterality: N/A;   FOOT SURGERY     morton's neuroma removed - right foot   FOOT SURGERY  10/2009   achilles tendon release with reconstructive surgery   GYNECOLOGIC CRYOSURGERY     class III pap   HAND SURGERY     cyst removed - left hand   hysteroscopy and D&C  7/09   KNEE ARTHROSCOPY Left [id number]   removed tear & resurfaced area   KNEE SURGERY  09/20/12   torn meniscus, (Dr Erin Sons)   LOOP RECORDER INSERTION N/A 12/28/2020   Procedure: LOOP RECORDER INSERTION;  Surgeon: Marcina Millard, MD;  Location: [facility] INVASIVE CV LAB;  Service: Cardiovascular;  Laterality: N/A;   SHOULDER ARTHROSCOPY WITH ROTATOR CUFF REPAIR Right 01/26/2020   Procedure: SHOULDER ARTHROSCOPY WITH ROTATOR CUFF REPAIR with distal clavicle resection, biceps tnotomy, subacromial decompression;  Surgeon: Lyndle Herrlich, MD;  Location: [facility] SURGERY [facility];  Service: Orthopedics;  Laterality: Right;  Diabetic - oral meds   TUBAL LIGATION     Family History  Problem Relation Age of Onset   Lung cancer Father    Alcohol abuse Father    Cancer Father        lung  CVA Mother    Colon cancer Mother    Alcohol abuse Mother    Cancer Mother        colon   Stroke Mother    Colon cancer Sister    Diabetes Maternal Grandfather    Social History   Socioeconomic History   Marital status: Married    Spouse name: Not on file   Number of children: 2   Years of education: Not on file   Highest education level: Not on file  Occupational History   Not on file  Tobacco Use   Smoking status: Never   Smokeless tobacco: Never  Vaping Use   Vaping Use: Never used  Substance and Sexual Activity   Alcohol use: No    Alcohol/week: 0.0 standard drinks of alcohol   Drug use: No   Sexual activity: Not Currently  Other Topics Concern   Not  on file  Social History Narrative   Not on file   Social Determinants of Health   Financial Resource Strain: Low Risk  (06/09/2019)   Overall Financial Resource Strain (CARDIA)    Difficulty of Paying Living Expenses: Not hard at all  Food Insecurity: No Food Insecurity (06/09/2019)   Hunger Vital Sign    Worried About Running Out of Food in the Last Year: Never true    Ran Out of Food in the Last Year: Never true  Transportation Needs: No Transportation Needs (06/09/2019)   PRAPARE - Administrator, Civil Service (Medical): No    Lack of Transportation (Non-Medical): No  Physical Activity: Not on file  Stress: No Stress Concern Present (06/09/2019)   [employer] of Occupational Health - Occupational Stress Questionnaire    Feeling of Stress : Only a little  Social Connections: Unknown (06/09/2020)   Social Connection and Isolation Panel [NHANES]    Frequency of Communication with Friends and Family: More than three times a week    Frequency of Social Gatherings with Friends and Family: More than three times a week    Attends Religious Services: Not on Marketing executive or Organizations: Not on file    Attends Banker Meetings: Not on file    Marital Status: Married     Review of Systems  Constitutional:  Negative for appetite change and unexpected weight change.  HENT:  Negative for congestion, sinus pressure and sore throat.   Eyes:  Negative for pain and visual disturbance.  Respiratory:  Negative for cough, chest tightness and shortness of breath.   Cardiovascular:  Negative for chest pain and palpitations.  Gastrointestinal:  Negative for abdominal pain, diarrhea, nausea and vomiting.  Genitourinary:  Negative for difficulty urinating and dysuria.  Musculoskeletal:  Negative for joint swelling and myalgias.  Skin:  Negative for color change and rash.       Hit left lower leg - abrasion.   Neurological:  Negative for dizziness and  headaches.  Hematological:  Negative for adenopathy. Does not bruise/bleed easily.  Psychiatric/Behavioral:  Negative for agitation and dysphoric mood.        Objective:     BP 122/70   Pulse 70   Temp 98 F (36.7 C)   Resp 16   Ht 5\' 5"  (1.651 m)   Wt 154 lb 3.2 oz (69.9 kg)   SpO2 98%   BMI 25.66 kg/m  Wt Readings from Last 3 Encounters:  04/04/23 154 lb 3.2 oz (69.9 kg)  04/02/23 153 lb (69.4  kg)  12/04/22 147 lb 9.6 oz (67 kg)    Physical Exam Vitals reviewed.  Constitutional:      General: She is not in acute distress.    Appearance: Normal appearance. She is well-developed.  HENT:     Head: Normocephalic and atraumatic.     Right Ear: External ear normal.     Left Ear: External ear normal.  Eyes:     General: No scleral icterus.       Right eye: No discharge.        Left eye: No discharge.     Conjunctiva/sclera: Conjunctivae normal.  Neck:     Thyroid: No thyromegaly.  Cardiovascular:     Rate and Rhythm: Normal rate and regular rhythm.  Pulmonary:     Effort: No tachypnea, accessory muscle usage or respiratory distress.     Breath sounds: Normal breath sounds. No decreased breath sounds, wheezing or rhonchi.  Chest:  Breasts:    Right: No inverted nipple, mass, nipple discharge or tenderness (no axillary adenopathy).     Left: No inverted nipple, mass, nipple discharge or tenderness (no axilarry adenopathy).  Abdominal:     General: Bowel sounds are normal.     Palpations: Abdomen is soft.     Tenderness: There is no abdominal tenderness.  Musculoskeletal:        General: No swelling or tenderness.     Cervical back: Neck supple.  Lymphadenopathy:     Cervical: No cervical adenopathy.  Skin:    General: Skin is warm.     Findings: No rash.     Comments: Well healed incision - right lower leg.  Open wound - minimal surrounding erythema-- left lower leg.  Minimal tenderness to palpation.    Neurological:     Mental Status: She is alert and  oriented to person, place, and time.  Psychiatric:        Mood and Affect: Mood normal.        Behavior: Behavior normal.      Outpatient Encounter Medications as of 04/04/2023  Medication Sig   mupirocin ointment (BACTROBAN) 2 % Apply 1 Application topically 2 (two) times daily.   ASPIRIN 81 PO Take 81 mg by mouth daily.   cholecalciferol (VITAMIN D) 25 MCG (1000 UNIT) tablet Take 1,000 Units by mouth daily.   Fe Fum-FePoly-Vit C-Vit B3 (INTEGRA) 62.5-62.5-40-3 MG CAPS Take 1 capsule by mouth every other day.   fish oil-omega-3 fatty acids 1000 MG capsule Take 2 g by mouth daily.    glucose blood test strip Use as instructed to check blood sugars three times daily. Has One Touch Ultra glucometer. Dx 250.00   meclizine (ANTIVERT) 25 MG tablet Take 1 tablet (25 mg total) by mouth 3 (three) times daily as needed for dizziness.   metFORMIN (GLUCOPHAGE) 500 MG tablet Take 1 tablet (500 mg total) by mouth 2 (two) times daily.   pantoprazole (PROTONIX) 40 MG tablet Take 1 tablet (40 mg total) by mouth daily.   rosuvastatin (CRESTOR) 20 MG tablet Take 1 tablet (20 mg total) by mouth daily.   telmisartan (MICARDIS) 20 MG tablet Take 1 tablet (20 mg total) by mouth daily.   [DISCONTINUED] benzonatate (TESSALON) 200 MG capsule Take 1 capsule (200 mg total) by mouth 2 (two) times daily as needed for cough.   [DISCONTINUED] fluticasone (FLONASE) 50 MCG/ACT nasal spray Place 2 sprays into both nostrils daily.   [DISCONTINUED] metFORMIN (GLUCOPHAGE) 500 MG tablet Take 1 tablet (500 mg total)  by mouth 2 (two) times daily.   [DISCONTINUED] saccharomyces boulardii (FLORASTOR) 250 MG capsule Take 1 capsule (250 mg total) by mouth daily.   No facility-administered encounter medications on file as of 04/04/2023.     Lab Results  Component Value Date   WBC 4.3 03/26/2023   HGB 13.4 03/26/2023   HCT 40.5 03/26/2023   PLT 168.0 03/26/2023   GLUCOSE 132 (H) 03/26/2023   CHOL 142 03/26/2023   TRIG 63.0  03/26/2023   HDL 61.90 03/26/2023   LDLCALC 68 03/26/2023   ALT 21 03/26/2023   AST 22 03/26/2023   NA 144 03/26/2023   K 3.7 03/26/2023   CL 104 03/26/2023   CREATININE 0.75 03/26/2023   BUN 13 03/26/2023   CO2 30 03/26/2023   TSH 2.44 11/29/2022   INR 1.0 03/13/2022   HGBA1C 6.9 (H) 03/26/2023   MICROALBUR 0.9 03/23/2022    No results found.     Assessment & Plan:  Routine general medical examination at a health care facility  Health care maintenance Assessment & Plan: Physical today 04/04/23.  PAP 09/2017 - negative with negative HPV.  Colonoscopy 04/02/23.   Mammogram 11/08/22 - Birads II.    Type 2 diabetes mellitus with neurological complications [facility]) Assessment & Plan: On metformin bid.  Low carb diet and exercise.  Follow met b anda 1c. Lab Results  Component Value Date   HGBA1C 6.9 (H) 03/26/2023    Orders: -     Hemoglobin A1c; Future -     Microalbumin / creatinine urine ratio; Future  Primary hypertension Assessment & Plan: Continue micardis.  Follow pressures.  Follow metabolic panel.   Orders: -     Basic metabolic panel; Future  Hypercholesterolemia Assessment & Plan: Continue crestor.  Low cholesterol diet and exercise.  Follow lipid panel and liver function tests.    Orders: -     Lipid panel; Future -     Hepatic function panel; Future  Abnormal liver function test Assessment & Plan: Recent liver panel wnl.  Continue diet and exercise.    Other iron deficiency anemia Assessment & Plan: Follow cbc.    History of breast cancer Assessment & Plan: Mammogram 11/08/22 - Birads II.     History of stroke Assessment & Plan: Previous work up:  MRI/MRA brain - no acute intracranial process.  Chronic left external capsule lacunar insult.  Minimal microvascular ischemic changes.  Mild right P2 segment narrowing.  Carotid ultrasound 1-39% bilateral.  S/p loop recorder insertion.  ECHO - ok. ILR - 2 minutes of what appeared to be afib.  Was on  eliquis.  She stopped eliquis on her own due to nosebleeds.  Was on aspirin and plavix. Off now.  Recommended continue aspirin.  See note.       Abrasion of left lower extremity, initial encounter Assessment & Plan: Small open area - s/p injury as outlined.  Bactroban topically.  Follow.  Call with update.    Other orders -     metFORMIN HCl; Take 1 tablet (500 mg total) by mouth 2 (two) times daily.  Dispense: 180 tablet; Refill: 1 -     Mupirocin; Apply 1 Application topically 2 (two) times daily.  Dispense: 22 g; Refill: 0     Dale Primghar, MD

## 2023-04-09 ENCOUNTER — Encounter: Payer: Self-pay | Admitting: Internal Medicine

## 2023-04-09 NOTE — Telephone Encounter (Signed)
We saw her 5/15. Patient has been using the bactroban. Thinks she may need oral abx. Ok to send in for her?

## 2023-04-10 NOTE — Telephone Encounter (Signed)
I have the message from you to clarify swelling. wanted you to see her updated message that her swelling has gone down after staying off of her leg.

## 2023-04-10 NOTE — Telephone Encounter (Signed)
Please let her know that I was not in the office yesterday.  I do not mind sending in abx, but please clarify the swelling.  If worsening, can reevaluate.  Also confirm no abx allergies.

## 2023-04-10 NOTE — Telephone Encounter (Signed)
Pt aware of below and confirmed leg is much better today.

## 2023-04-10 NOTE — Telephone Encounter (Signed)
Please call her and let her know that I was not in the office yesterday.  If swelling has improved/resolved and no increased redness, continue to monitor.  Elevate legs.  Call with update.  Let me know if any worsening.

## 2023-04-16 ENCOUNTER — Encounter: Payer: Self-pay | Admitting: Internal Medicine

## 2023-04-16 DIAGNOSIS — S80812A Abrasion, left lower leg, initial encounter: Secondary | ICD-10-CM | POA: Insufficient documentation

## 2023-04-16 NOTE — Assessment & Plan Note (Signed)
On metformin bid.  Low carb diet and exercise.  Follow met b anda 1c. Lab Results  Component Value Date   HGBA1C 6.9 (H) 03/26/2023

## 2023-04-16 NOTE — Assessment & Plan Note (Signed)
Follow cbc.  

## 2023-04-16 NOTE — Assessment & Plan Note (Signed)
Small open area - s/p injury as outlined.  Bactroban topically.  Follow.  Call with update.

## 2023-04-16 NOTE — Assessment & Plan Note (Signed)
Continue crestor.  Low cholesterol diet and exercise.  Follow lipid panel and liver function tests.  

## 2023-04-16 NOTE — Assessment & Plan Note (Signed)
Continue micardis.  Follow pressures.  Follow metabolic panel.  

## 2023-04-16 NOTE — Assessment & Plan Note (Signed)
Previous work up:  MRI/MRA brain - no acute intracranial process.  Chronic left external capsule lacunar insult.  Minimal microvascular ischemic changes.  Mild right P2 segment narrowing.  Carotid ultrasound 1-39% bilateral.  S/p loop recorder insertion.  ECHO - ok. ILR - 2 minutes of what appeared to be afib.  Was on eliquis.  She stopped eliquis on her own due to nosebleeds.  Was on aspirin and plavix. Off now.  Recommended continue aspirin.  See note.     

## 2023-04-16 NOTE — Assessment & Plan Note (Signed)
Mammogram 11/08/22 - Birads II.   

## 2023-04-16 NOTE — Assessment & Plan Note (Signed)
Recent liver panel wnl.  Continue diet and exercise.  

## 2023-04-26 ENCOUNTER — Other Ambulatory Visit: Payer: Self-pay

## 2023-04-26 ENCOUNTER — Encounter: Payer: Self-pay | Admitting: Internal Medicine

## 2023-04-26 NOTE — Telephone Encounter (Signed)
Noted. Will follow.  

## 2023-06-05 ENCOUNTER — Ambulatory Visit
Admission: EM | Admit: 2023-06-05 | Discharge: 2023-06-05 | Disposition: A | Discharge status: Home / Self Care | Payer: Medicare HMO

## 2023-06-05 DIAGNOSIS — L237 Allergic contact dermatitis due to plants, except food: Secondary | ICD-10-CM | POA: Diagnosis not present

## 2023-06-05 MED ORDER — PREDNISONE 10 MG (21) PO TBPK: ORAL_TABLET | Freq: Every day | ORAL | 0 refills | Status: DC

## 2023-06-05 NOTE — ED Triage Notes (Signed)
Pt c/o rash in R arm x5 days. States was working around garden & rash appeared after. Has tried calamine lotion w/o relief.

## 2023-06-05 NOTE — ED Provider Notes (Signed)
[facility]    CSN: [id number] Arrival date & time: 06/05/23  1018      History   Chief Complaint Chief Complaint  Patient presents with   Rash    HPI Angel Floyd is a 72 y.o. female.   Patient presents for evaluation of erythematous pruritic rash present to the right lower arm beginning 3 days ago.  Gardening 2 days prior, believes to be poison oak or ivy.  Has been managing with calamine spray, Benadryl, outside on soap, salt and vinegar which has been keeping symptoms for spreading but has not resolved.  Denies drainage or fever.   Past Medical History:  Diagnosis Date   Anemia    Breast cancer (HCC)    COVID-19 08/05/2019   Diabetes mellitus (HCC)    Dyspnea    better since recovery from COVID   GERD (gastroesophageal reflux disease)    Heart murmur    History of abnormal Pap smear    class III, required cryosurgery   Hypercholesterolemia    Hypertension    Presence of Watchman left atrial appendage closure device    Skin cancer    x3   TIA (transient ischemic attack)    2008, 2018. no deficit    Patient Active Problem List   Diagnosis Date Noted   Abrasion of left leg 04/16/2023   Hypokalemia 08/05/2022   Easy bruising 03/13/2022   Vaccine counseling 11/26/2021   Influenza 11/18/2021   Back pain 10/22/2020   Hip pain 10/22/2020   Right shoulder pain 11/16/2019   Abnormal liver function test 08/28/2019   History of stroke 08/10/2019   Transaminitis 08/10/2019   Persistent cough 11/17/2018   Health care maintenance 04/08/2015   Carotid bruit 12/12/2014   Left leg pain 07/12/2014   Hypertension 10/10/2012   Hypercholesterolemia 10/10/2012   Type 2 diabetes mellitus with neurological complications (HCC) 10/10/2012   History of TIA (transient ischemic attack) 10/10/2012   Anemia 10/10/2012   History of breast cancer 10/10/2012    Past Surgical History:  Procedure Laterality Date   BREAST CYST ASPIRATION  1989   COLONOSCOPY WITH  PROPOFOL N/A 11/05/2017   Procedure: COLONOSCOPY WITH PROPOFOL;  Surgeon: Christena Deem, MD;  Location: [facility] ENDOSCOPY;  Service: Endoscopy;  Laterality: N/A;   COLONOSCOPY WITH PROPOFOL N/A 04/02/2023   Procedure: COLONOSCOPY WITH PROPOFOL;  Surgeon: Regis Bill, MD;  Location: [facility] ENDOSCOPY;  Service: Endoscopy;  Laterality: N/A;   DILATION AND CURETTAGE OF UTERUS     ESOPHAGOGASTRODUODENOSCOPY (EGD) WITH PROPOFOL N/A 11/05/2017   Procedure: ESOPHAGOGASTRODUODENOSCOPY (EGD) WITH PROPOFOL;  Surgeon: Christena Deem, MD;  Location[facility] ENDOSCOPY;  Service: Endoscopy;  Laterality: N/A;   FOOT SURGERY     morton's neuroma removed - right foot   FOOT SURGERY  10/2009   achilles tendon release with reconstructive surgery   GYNECOLOGIC CRYOSURGERY     class III pap   HAND SURGERY     cyst removed - left hand   hysteroscopy and D&C  7/09   KNEE ARTHROSCOPY Left [id number]   removed tear & resurfaced area   KNEE SURGERY  09/20/12   torn meniscus, (Dr Erin Sons)   LOOP RECORDER INSERTION N/A 12/28/2020   Procedure: LOOP RECORDER INSERTION;  Surgeon: Marcina Millard, MD;  Location: [facility] INVASIVE CV LAB;  Service: Cardiovascular;  Laterality: N/A;   SHOULDER ARTHROSCOPY WITH ROTATOR CUFF REPAIR Right 01/26/2020   Procedure: SHOULDER ARTHROSCOPY WITH ROTATOR CUFF REPAIR with distal clavicle resection, biceps tnotomy,  subacromial decompression;  Surgeon: Lyndle Herrlich, MD;  Location: [facility]ERY [facility];  Service: Orthopedics;  Laterality: Right;  Diabetic - oral meds   TUBAL LIGATION      OB History   No obstetric history on file.      Home Medications    Prior to Admission medications   Medication Sig Start Date End Date Taking? Authorizing Provider  ASPIRIN 81 PO Take 81 mg by mouth daily. 11/10/21  Yes [provider]  cholecalciferol (VITAMIN D) 25 MCG (1000 UNIT) tablet Take 1,000 Units by mouth daily.   Yes [provider]  Fe Fum-FePoly-Vit  C-Vit B3 (INTEGRA) 62.5-62.5-40-3 MG CAPS Take 1 capsule by mouth every other day. 11/25/21  Yes Dale Advance, MD  glucose blood test strip Use as instructed to check blood sugars three times daily. Has One Touch Ultra glucometer. Dx 250.00 01/23/18  Yes Dale Troy, MD  meclizine (ANTIVERT) 25 MG tablet Take 1 tablet (25 mg total) by mouth 3 (three) times daily as needed for dizziness. 01/09/22  Yes Phineas Semen, MD  metFORMIN (GLUCOPHAGE) 500 MG tablet Take 1 tablet (500 mg total) by mouth 2 (two) times daily. 04/04/23  Yes Dale Bellmont, MD  methocarbamol (ROBAXIN) 500 MG tablet Take 1,000 mg by mouth every 8 (eight) hours as needed. 06/01/23  Yes [provider]  mupirocin ointment (BACTROBAN) 2 % Apply 1 Application topically 2 (two) times daily. 04/04/23  Yes Dale Lacombe, MD  pantoprazole (PROTONIX) 40 MG tablet Take 1 tablet (40 mg total) by mouth daily. 01/16/23  Yes Dale Nederland, MD  rosuvastatin (CRESTOR) 20 MG tablet Take 1 tablet (20 mg total) by mouth daily. 12/04/22  Yes Dale New Stanton, MD  telmisartan (MICARDIS) 20 MG tablet Take 1 tablet (20 mg total) by mouth daily. 10/25/22  Yes Dale Maywood, MD    Family History Family History  Problem Relation Age of Onset   Lung cancer Father    Alcohol abuse Father    Cancer Father        lung   CVA Mother    Colon cancer Mother    Alcohol abuse Mother    Cancer Mother        colon   Stroke Mother    Colon cancer Sister    Diabetes Maternal Grandfather     Social History Social History   Tobacco Use   Smoking status: Never   Smokeless tobacco: Never  Vaping Use   Vaping status: Never Used  Substance Use Topics   Alcohol use: No    Alcohol/week: 0.0 standard drinks of alcohol   Drug use: No     Allergies   Aspirin-dipyridamole er and Aggrenox [aspirin-dipyridamole er]   Review of Systems Review of Systems  Constitutional: Negative.   Respiratory: Negative.    Cardiovascular: Negative.    Skin:  Positive for rash. Negative for color change, pallor and wound.  Neurological: Negative.      Physical Exam Triage Vital Signs ED Triage Vitals  Encounter Vitals Group     BP 06/05/23 1025 138/88     Systolic BP Percentile --      Diastolic BP Percentile --      Pulse Rate 06/05/23 1025 62     Resp 06/05/23 1025 16     Temp 06/05/23 1025 98.4 F (36.9 C)     Temp Source 06/05/23 1025 Oral     SpO2 06/05/23 1025 96 %     Weight 06/05/23 1025 155 lb (70.3  kg)     Height 06/05/23 1025 5\' 5"  (1.651 m)     Head Circumference --      Peak Flow --      Pain Score 06/05/23 1028 0     Pain Loc --      Pain Education --      Exclude from Growth Chart --    No data found.  Updated Vital Signs BP 138/88 (BP Location: Right Arm)   Pulse 62   Temp 98.4 F (36.9 C) (Oral)   Resp 16   Ht 5\' 5"  (1.651 m)   Wt 155 lb (70.3 kg)   SpO2 96%   BMI 25.79 kg/m   Visual Acuity Right Eye Distance:   Left Eye Distance:   Bilateral Distance:    Right Eye Near:   Left Eye Near:    Bilateral Near:     Physical Exam Constitutional:      Appearance: Normal appearance.  Musculoskeletal:     Comments: Erythematous maculopapular rash present to the anterior and posterior right forearm extending into the dorsum of the hand      UC Treatments / Results  Labs (all labs ordered are listed, but only abnormal results are displayed) Labs Reviewed - No data to display  EKG   Radiology No results found.  Procedures Procedures (including critical care time)  Medications Ordered in UC Medications - No data to display  Initial Impression / Assessment and Plan / UC Course  I have reviewed the triage vital signs and the nursing notes.  Pertinent labs & imaging results that were available during my care of the patient were reviewed by me and considered in my medical decision making (see chart for details).  Poison ivy dermatitis  Appears inflammatory, no signs of infection,  prednisone taper prescribed, may continue use of topical products as needed, advised against long exposure to heat to prevent irritation, may follow-up if symptoms persist or worsen Final Clinical Impressions(s) / UC Diagnoses   Final diagnoses:  None   Discharge Instructions   None    ED Prescriptions   None    PDMP not reviewed this encounter.   Valinda Hoar, NP 06/05/23 1116

## 2023-06-05 NOTE — Discharge Instructions (Signed)
Today you are being treated for the poison ivy/oak rash  take prednisone every morning with food as directed to help reduce the inflammatory process that occurs with this rash which will help minimize your itching as well as begin to clear  You may continue use of topical calamine or Benadryl cream and additional products to help manage itching, you may also continue oral Benadryl  Please avoid long exposures to heat such as a hot steamy shower or being outside as this may cause further irritation to your rash  You may follow-up with his urgent care as needed if symptoms persist or worsen

## 2023-07-05 ENCOUNTER — Encounter (INDEPENDENT_AMBULATORY_CARE_PROVIDER_SITE_OTHER): Payer: Self-pay

## 2023-07-31 ENCOUNTER — Other Ambulatory Visit (INDEPENDENT_AMBULATORY_CARE_PROVIDER_SITE_OTHER): Payer: Medicare HMO

## 2023-07-31 DIAGNOSIS — I10 Essential (primary) hypertension: Secondary | ICD-10-CM | POA: Diagnosis not present

## 2023-07-31 DIAGNOSIS — E1149 Type 2 diabetes mellitus with other diabetic neurological complication: Secondary | ICD-10-CM | POA: Diagnosis not present

## 2023-07-31 DIAGNOSIS — E7800 Pure hypercholesterolemia, unspecified: Secondary | ICD-10-CM

## 2023-07-31 LAB — BASIC METABOLIC PANEL
BUN: 19 mg/dL (ref 6–23)
CO2: 28 meq/L (ref 19–32)
Calcium: 9.2 mg/dL (ref 8.4–10.5)
Chloride: 107 meq/L (ref 96–112)
Creatinine, Ser: 0.8 mg/dL (ref 0.40–1.20)
GFR: 74.12 mL/min (ref >60.00)
Glucose, Bld: 130 mg/dL — ABNORMAL HIGH (ref 70–99)
Potassium: 4 meq/L (ref 3.5–5.1)
Sodium: 142 meq/L (ref 135–145)

## 2023-07-31 LAB — MICROALBUMIN / CREATININE URINE RATIO
Creatinine,U: 106.2 mg/dL
Microalb Creat Ratio: 0.7 mg/g (ref 0.0–30.0)
Microalb, Ur: <0.7 mg/dL (ref 0.0–1.9)

## 2023-07-31 LAB — HEPATIC FUNCTION PANEL
ALT: 19 U/L (ref 0–35)
AST: 18 U/L (ref 0–37)
Albumin: 4.1 g/dL (ref 3.5–5.2)
Alkaline Phosphatase: 56 U/L (ref 39–117)
Bilirubin, Direct: 0.1 mg/dL (ref 0.0–0.3)
Total Bilirubin: 0.6 mg/dL (ref 0.2–1.2)
Total Protein: 6.3 g/dL (ref 6.0–8.3)

## 2023-07-31 LAB — LIPID PANEL
Cholesterol: 135 mg/dL (ref 0–200)
HDL: 63.9 mg/dL (ref >39.00)
LDL Cholesterol: 62 mg/dL (ref 0–99)
NonHDL: 71.37
Total CHOL/HDL Ratio: 2
Triglycerides: 47 mg/dL (ref 0.0–149.0)
VLDL: 9.4 mg/dL (ref 0.0–40.0)

## 2023-07-31 LAB — HEMOGLOBIN A1C: Hgb A1c MFr Bld: 6.9 % — ABNORMAL HIGH (ref 4.6–6.5)

## 2023-08-02 ENCOUNTER — Ambulatory Visit (INDEPENDENT_AMBULATORY_CARE_PROVIDER_SITE_OTHER): Payer: Medicare HMO | Admitting: Internal Medicine

## 2023-08-02 ENCOUNTER — Encounter: Payer: Self-pay | Admitting: Internal Medicine

## 2023-08-02 VITALS — BP 124/70 | HR 69 | Temp 99.4°F | Ht 65.0 in | Wt 154.4 lb

## 2023-08-02 DIAGNOSIS — Z1231 Encounter for screening mammogram for malignant neoplasm of breast: Secondary | ICD-10-CM

## 2023-08-02 DIAGNOSIS — E1149 Type 2 diabetes mellitus with other diabetic neurological complication: Secondary | ICD-10-CM

## 2023-08-02 DIAGNOSIS — Z853 Personal history of malignant neoplasm of breast: Secondary | ICD-10-CM

## 2023-08-02 DIAGNOSIS — Z7984 Long term (current) use of oral hypoglycemic drugs: Secondary | ICD-10-CM | POA: Diagnosis not present

## 2023-08-02 DIAGNOSIS — Z8673 Personal history of transient ischemic attack (TIA), and cerebral infarction without residual deficits: Secondary | ICD-10-CM

## 2023-08-02 DIAGNOSIS — E7800 Pure hypercholesterolemia, unspecified: Secondary | ICD-10-CM

## 2023-08-02 DIAGNOSIS — M25511 Pain in right shoulder: Secondary | ICD-10-CM

## 2023-08-02 DIAGNOSIS — I10 Essential (primary) hypertension: Secondary | ICD-10-CM | POA: Diagnosis not present

## 2023-08-02 DIAGNOSIS — R7989 Other specified abnormal findings of blood chemistry: Secondary | ICD-10-CM

## 2023-08-02 MED ORDER — TELMISARTAN 20 MG PO TABS: 20.0000 mg | ORAL_TABLET | Freq: Every day | ORAL | 3 refills | Status: DC

## 2023-08-02 MED ORDER — METFORMIN HCL 500 MG PO TABS: 500.0000 mg | ORAL_TABLET | Freq: Two times a day (BID) | ORAL | 1 refills | Status: DC

## 2023-08-02 NOTE — Progress Notes (Signed)
Subjective:    Patient ID: Angel Floyd, female    DOB: [birth year], 72 y.o.   MRN: [id number]  Patient here for  Chief Complaint  Patient presents with   Medical Management of Chronic Issues    HPI Here for follow up - f/u regarding hypercholesterolemia, diabetes and hypertension. S/p watchman procedure 01/2022. Continues on aspirin. Staying active. Saw Dr Lady Gary 10/10/22.  Previous functional study - no ischemia.  . Previous heart palpitations-ILR showed 2 minutes of probable A. fib. Now with Watchman device in place. Her CHA2DS2 - VASc is 2. Saw Dr. Toma Copier (EP) -  07/13/23 - recommended continuing aspirin daily. Loop recorder interrogated revealed a handful of brief AF episodes lasting 2-4 minutes. No PVC burden reported on her monitor.  Follow up one year. She reports she is doing relatively well.  No chest pain or sob reported.  No abdominal pain or bowel change reported.  Handling stress.    Past Medical History:  Diagnosis Date   Anemia    Breast cancer (HCC)    COVID-19 08/05/2019   Diabetes mellitus (HCC)    Dyspnea    better since recovery from COVID   GERD (gastroesophageal reflux disease)    Heart murmur    History of abnormal Pap smear    class III, required cryosurgery   Hypercholesterolemia    Hypertension    Presence of Watchman left atrial appendage closure device    Skin cancer    x3   TIA (transient ischemic attack)    2008, 2018. no deficit   Past Surgical History:  Procedure Laterality Date   BREAST CYST ASPIRATION  1989   COLONOSCOPY WITH PROPOFOL N/A 11/05/2017   Procedure: COLONOSCOPY WITH PROPOFOL;  Surgeon: Christena Deem, MD;  Location: [facility] ENDOSCOPY;  Service: Endoscopy;  Laterality: N/A;   COLONOSCOPY WITH PROPOFOL N/A 04/02/2023   Procedure: COLONOSCOPY WITH PROPOFOL;  Surgeon: Regis Bill, MD;  Location: [facility] ENDOSCOPY;  Service: Endoscopy;  Laterality: N/A;   DILATION AND CURETTAGE OF UTERUS     ESOPHAGOGASTRODUODENOSCOPY (EGD)  WITH PROPOFOL N/A 11/05/2017   Procedure: ESOPHAGOGASTRODUODENOSCOPY (EGD) WITH PROPOFOL;  Surgeon: Christena Deem, MD;  Location: [facility] ENDOSCOPY;  Service: Endoscopy;  Laterality: N/A;   FOOT SURGERY     morton's neuroma removed - right foot   FOOT SURGERY  10/2009   achilles tendon release with reconstructive surgery   GYNECOLOGIC CRYOSURGERY     class III pap   HAND SURGERY     cyst removed - left hand   hysteroscopy and D&C  7/09   KNEE ARTHROSCOPY Left [id number]   removed tear & resurfaced area   KNEE SURGERY  09/20/12   torn meniscus, (Dr Erin Sons)   LOOP RECORDER INSERTION N/A 12/28/2020   Procedure: LOOP RECORDER INSERTION;  Surgeon: Marcina Millard, MD;  Location: [facility] INVASIVE CV LAB;  Service: Cardiovascular;  Laterality: N/A;   SHOULDER ARTHROSCOPY WITH ROTATOR CUFF REPAIR Right 01/26/2020   Procedure: SHOULDER ARTHROSCOPY WITH ROTATOR CUFF REPAIR with distal clavicle resection, biceps tnotomy, subacromial decompression;  Surgeon: Lyndle Herrlich, MD;  Location: [facility];  Service: Orthopedics;  Laterality: Right;  Diabetic - oral meds   TUBAL LIGATION     Family History  Problem Relation Age of Onset   Lung cancer Father    Alcohol abuse Father    Cancer Father        lung   CVA Mother    Colon cancer Mother    Alcohol  abuse Mother    Cancer Mother        colon   Stroke Mother    Colon cancer Sister    Diabetes Maternal Grandfather    Social History   Socioeconomic History   Marital status: Married    Spouse name: Not on file   Number of children: 2   Years of education: Not on file   Highest education level: Not on file  Occupational History   Not on file  Tobacco Use   Smoking status: Never   Smokeless tobacco: Never  Vaping Use   Vaping status: Never Used  Substance and Sexual Activity   Alcohol use: No    Alcohol/week: 0.0 standard drinks of alcohol   Drug use: No   Sexual activity: Not Currently  Other Topics Concern    Not on file  Social History Narrative   Not on file   Social Determinants of Health   Financial Resource Strain: Low Risk  (06/09/2019)   Overall Financial Resource Strain (CARDIA)    Difficulty of Paying Living Expenses: Not hard at all  Food Insecurity: No Food Insecurity (06/09/2019)   Hunger Vital Sign    Worried About Running Out of Food in the Last Year: Never true    Ran Out of Food in the Last Year: Never true  Transportation Needs: No Transportation Needs (06/09/2019)   PRAPARE - Administrator, Civil Service (Medical): No    Lack of Transportation (Non-Medical): No  Physical Activity: Not on file  Stress: No Stress Concern Present (06/09/2019)   [employer] of Occupational Health - Occupational Stress Questionnaire    Feeling of Stress : Only a little  Social Connections: Unknown (06/09/2020)   Social Connection and Isolation Panel [NHANES]    Frequency of Communication with Friends and Family: More than three times a week    Frequency of Social Gatherings with Friends and Family: More than three times a week    Attends Religious Services: Not on Marketing executive or Organizations: Not on file    Attends Banker Meetings: Not on file    Marital Status: Married     Review of Systems  Constitutional:  Negative for appetite change and unexpected weight change.  HENT:  Negative for congestion and sinus pressure.   Respiratory:  Negative for cough, chest tightness and shortness of breath.   Cardiovascular:  Negative for chest pain, palpitations and leg swelling.  Gastrointestinal:  Negative for abdominal pain, diarrhea, nausea and vomiting.  Genitourinary:  Negative for difficulty urinating and dysuria.  Musculoskeletal:  Negative for joint swelling and myalgias.  Skin:  Negative for color change and rash.  Neurological:  Negative for dizziness and headaches.  Psychiatric/Behavioral:  Negative for agitation and dysphoric mood.         Objective:     BP 124/70   Pulse 69   Temp 99.4 F (37.4 C) (Oral)   Ht 5\' 5"  (1.651 m)   Wt 154 lb 6.4 oz (70 kg)   SpO2 95%   BMI 25.69 kg/m  Wt Readings from Last 3 Encounters:  08/02/23 154 lb 6.4 oz (70 kg)  06/05/23 155 lb (70.3 kg)  04/04/23 154 lb 3.2 oz (69.9 kg)    Physical Exam Vitals reviewed.  Constitutional:      General: She is not in acute distress.    Appearance: Normal appearance.  HENT:     Head: Normocephalic and atraumatic.  Right Ear: External ear normal.     Left Ear: External ear normal.  Eyes:     General: No scleral icterus.       Right eye: No discharge.        Left eye: No discharge.     Conjunctiva/sclera: Conjunctivae normal.  Neck:     Thyroid: No thyromegaly.  Cardiovascular:     Rate and Rhythm: Normal rate and regular rhythm.  Pulmonary:     Effort: No respiratory distress.     Breath sounds: Normal breath sounds. No wheezing.  Abdominal:     General: Bowel sounds are normal.     Palpations: Abdomen is soft.     Tenderness: There is no abdominal tenderness.  Musculoskeletal:        General: No swelling or tenderness.     Cervical back: Neck supple. No tenderness.  Lymphadenopathy:     Cervical: No cervical adenopathy.  Skin:    Findings: No erythema or rash.  Neurological:     Mental Status: She is alert.  Psychiatric:        Mood and Affect: Mood normal.        Behavior: Behavior normal.      Outpatient Encounter Medications as of 08/02/2023  Medication Sig   ASPIRIN 81 PO Take 81 mg by mouth daily.   cholecalciferol (VITAMIN D) 25 MCG (1000 UNIT) tablet Take 1,000 Units by mouth daily.   Fe Fum-FePoly-Vit C-Vit B3 (INTEGRA) 62.5-62.5-40-3 MG CAPS Take 1 capsule by mouth every other day.   glucose blood test strip Use as instructed to check blood sugars three times daily. Has One Touch Ultra glucometer. Dx 250.00   meclizine (ANTIVERT) 25 MG tablet Take 1 tablet (25 mg total) by mouth 3 (three) times daily  as needed for dizziness.   metFORMIN (GLUCOPHAGE) 500 MG tablet Take 1 tablet (500 mg total) by mouth 2 (two) times daily.   pantoprazole (PROTONIX) 40 MG tablet Take 1 tablet (40 mg total) by mouth daily.   rosuvastatin (CRESTOR) 20 MG tablet Take 1 tablet (20 mg total) by mouth daily.   telmisartan (MICARDIS) 20 MG tablet Take 1 tablet (20 mg total) by mouth daily.   [DISCONTINUED] metFORMIN (GLUCOPHAGE) 500 MG tablet Take 1 tablet (500 mg total) by mouth 2 (two) times daily.   [DISCONTINUED] methocarbamol (ROBAXIN) 500 MG tablet Take 1,000 mg by mouth every 8 (eight) hours as needed.   [DISCONTINUED] mupirocin ointment (BACTROBAN) 2 % Apply 1 Application topically 2 (two) times daily.   [DISCONTINUED] predniSONE (STERAPRED UNI-PAK 21 TAB) 10 MG (21) TBPK tablet Take by mouth daily. Take 6 tabs by mouth daily  for 1 days, then 5 tabs for 1 days, then 4 tabs for 1 days, then 3 tabs for 1 days, 2 tabs for 1 days, then 1 tab by mouth daily for 1 days   [DISCONTINUED] telmisartan (MICARDIS) 20 MG tablet Take 1 tablet (20 mg total) by mouth daily.   No facility-administered encounter medications on file as of 08/02/2023.     Lab Results  Component Value Date   WBC 4.3 03/26/2023   HGB 13.4 03/26/2023   HCT 40.5 03/26/2023   PLT 168.0 03/26/2023   GLUCOSE 130 (H) 07/31/2023   CHOL 135 07/31/2023   TRIG 47.0 07/31/2023   HDL 63.90 07/31/2023   LDLCALC 62 07/31/2023   ALT 19 07/31/2023   AST 18 07/31/2023   NA 142 07/31/2023   K 4.0 07/31/2023   CL 107 07/31/2023  CREATININE 0.80 07/31/2023   BUN 19 07/31/2023   CO2 28 07/31/2023   TSH 2.44 11/29/2022   INR 1.0 03/13/2022   HGBA1C 6.9 (H) 07/31/2023   MICROALBUR <0.7 07/31/2023    No results found.     Assessment & Plan:  Type 2 diabetes mellitus with neurological complications [facility]) Assessment & Plan: On metformin bid.  Low carb diet and exercise.  Follow met b anda 1c. Lab Results  Component Value Date   HGBA1C 6.9 (H)  07/31/2023    Orders: -     Hemoglobin A1c; Future  Hypercholesterolemia Assessment & Plan: Continue crestor.  Low cholesterol diet and exercise.  Follow lipid panel and liver function tests.    Orders: -     Basic metabolic panel; Future -     Hepatic function panel; Future -     Lipid panel; Future  Primary hypertension Assessment & Plan: Continue micardis.  Follow pressures.  Follow metabolic panel.   Orders: -     TSH; Future  Visit for screening mammogram -     3D Screening Mammogram, Left and Right; Future  Right shoulder pain, unspecified chronicity Assessment & Plan: S/p right shoulder surgery.  Emerge 06/2023 - continue home exercise. Follow.     History of TIA (transient ischemic attack) Assessment & Plan: On aspirin only now.  Cardiology stopped plavix secondary to nosebleeds. Follow. Continue risk factor modification.   History of breast cancer Assessment & Plan: Mammogram 11/08/22 - Birads II.     Abnormal liver function test Assessment & Plan: Recent liver panel wnl.  Continue diet and exercise.    Other orders -     metFORMIN HCl; Take 1 tablet (500 mg total) by mouth 2 (two) times daily.  Dispense: 180 tablet; Refill: 1 -     Telmisartan; Take 1 tablet (20 mg total) by mouth daily.  Dispense: 90 tablet; Refill: 3     Dale Lusby, MD

## 2023-08-04 ENCOUNTER — Encounter: Payer: Self-pay | Admitting: Internal Medicine

## 2023-08-04 NOTE — Assessment & Plan Note (Signed)
Recent liver panel wnl.  Continue diet and exercise.

## 2023-08-04 NOTE — Assessment & Plan Note (Signed)
S/p right shoulder surgery.  Emerge 06/2023 - continue home exercise. Follow.

## 2023-08-04 NOTE — Assessment & Plan Note (Signed)
Mammogram 11/08/22 - [facility] II.

## 2023-08-04 NOTE — Assessment & Plan Note (Signed)
On aspirin only now.  Cardiology stopped plavix secondary to nosebleeds. Follow. Continue risk factor modification.

## 2023-08-04 NOTE — Assessment & Plan Note (Signed)
Continue crestor.  Low cholesterol diet and exercise.  Follow lipid panel and liver function tests.  

## 2023-08-04 NOTE — Assessment & Plan Note (Signed)
Continue micardis.  Follow pressures.  Follow metabolic panel.

## 2023-08-04 NOTE — Assessment & Plan Note (Signed)
On metformin bid.  Low carb diet and exercise.  Follow met b anda 1c. Lab Results  Component Value Date   HGBA1C 6.9 (H) 07/31/2023

## 2023-08-14 ENCOUNTER — Other Ambulatory Visit: Payer: Medicare HMO

## 2023-08-16 ENCOUNTER — Ambulatory Visit: Payer: Medicare HMO | Admitting: Internal Medicine

## 2023-09-25 ENCOUNTER — Encounter: Payer: Self-pay | Admitting: Internal Medicine

## 2023-10-10 ENCOUNTER — Encounter: Payer: Self-pay | Admitting: Internal Medicine

## 2023-10-10 NOTE — Telephone Encounter (Signed)
Called and spoke to [place].  Discussed meloxicam. Discussed possible side effects.  Will monitor blood pressure and for side effects.

## 2023-11-12 LAB — HM MAMMOGRAPHY

## 2023-11-20 ENCOUNTER — Encounter: Payer: Self-pay | Admitting: *Deleted

## 2023-11-30 ENCOUNTER — Encounter: Payer: Self-pay | Admitting: Internal Medicine

## 2023-11-30 NOTE — Telephone Encounter (Signed)
Update for you. 

## 2023-12-05 ENCOUNTER — Other Ambulatory Visit: Payer: Self-pay | Admitting: Internal Medicine

## 2023-12-10 ENCOUNTER — Other Ambulatory Visit (INDEPENDENT_AMBULATORY_CARE_PROVIDER_SITE_OTHER): Payer: Medicare HMO

## 2023-12-10 DIAGNOSIS — I10 Essential (primary) hypertension: Secondary | ICD-10-CM | POA: Diagnosis not present

## 2023-12-10 DIAGNOSIS — E7800 Pure hypercholesterolemia, unspecified: Secondary | ICD-10-CM

## 2023-12-10 DIAGNOSIS — E1149 Type 2 diabetes mellitus with other diabetic neurological complication: Secondary | ICD-10-CM | POA: Diagnosis not present

## 2023-12-10 LAB — BASIC METABOLIC PANEL
BUN: 17 mg/dL (ref 6–23)
CO2: 30 meq/L (ref 19–32)
Calcium: 9.2 mg/dL (ref 8.4–10.5)
Chloride: 103 meq/L (ref 96–112)
Creatinine, Ser: 0.67 mg/dL (ref 0.40–1.20)
GFR: 87.71 mL/min (ref >60.00)
Glucose, Bld: 140 mg/dL — ABNORMAL HIGH (ref 70–99)
Potassium: 3.8 meq/L (ref 3.5–5.1)
Sodium: 141 meq/L (ref 135–145)

## 2023-12-10 LAB — HEPATIC FUNCTION PANEL
ALT: 17 U/L (ref 0–35)
AST: 17 U/L (ref 0–37)
Albumin: 4.2 g/dL (ref 3.5–5.2)
Alkaline Phosphatase: 56 U/L (ref 39–117)
Bilirubin, Direct: 0.1 mg/dL (ref 0.0–0.3)
Total Bilirubin: 0.7 mg/dL (ref 0.2–1.2)
Total Protein: 6.3 g/dL (ref 6.0–8.3)

## 2023-12-10 LAB — LIPID PANEL
Cholesterol: 155 mg/dL (ref 0–200)
HDL: 63.6 mg/dL (ref >39.00)
LDL Cholesterol: 77 mg/dL (ref 0–99)
NonHDL: 91.39
Total CHOL/HDL Ratio: 2
Triglycerides: 72 mg/dL (ref 0.0–149.0)
VLDL: 14.4 mg/dL (ref 0.0–40.0)

## 2023-12-10 LAB — TSH: TSH: 1.81 u[IU]/mL (ref 0.35–5.50)

## 2023-12-10 LAB — HEMOGLOBIN A1C: Hgb A1c MFr Bld: 7.1 % — ABNORMAL HIGH (ref 4.6–6.5)

## 2023-12-10 LAB — HM DIABETES EYE EXAM

## 2023-12-12 ENCOUNTER — Encounter: Payer: Self-pay | Admitting: Internal Medicine

## 2023-12-12 ENCOUNTER — Ambulatory Visit: Payer: Medicare HMO | Admitting: Internal Medicine

## 2023-12-12 ENCOUNTER — Other Ambulatory Visit: Payer: Self-pay | Admitting: Internal Medicine

## 2023-12-12 VITALS — BP 124/72 | HR 79 | Temp 98.0°F | Resp 16 | Ht 65.0 in | Wt 155.0 lb

## 2023-12-12 DIAGNOSIS — Z8673 Personal history of transient ischemic attack (TIA), and cerebral infarction without residual deficits: Secondary | ICD-10-CM

## 2023-12-12 DIAGNOSIS — E7800 Pure hypercholesterolemia, unspecified: Secondary | ICD-10-CM

## 2023-12-12 DIAGNOSIS — Z853 Personal history of malignant neoplasm of breast: Secondary | ICD-10-CM

## 2023-12-12 DIAGNOSIS — G2581 Restless legs syndrome: Secondary | ICD-10-CM | POA: Diagnosis not present

## 2023-12-12 DIAGNOSIS — E1149 Type 2 diabetes mellitus with other diabetic neurological complication: Secondary | ICD-10-CM | POA: Diagnosis not present

## 2023-12-12 DIAGNOSIS — I10 Essential (primary) hypertension: Secondary | ICD-10-CM

## 2023-12-12 DIAGNOSIS — Z7984 Long term (current) use of oral hypoglycemic drugs: Secondary | ICD-10-CM

## 2023-12-12 LAB — IBC + FERRITIN
Ferritin: 75.6 ng/mL (ref 10.0–291.0)
Iron: 65 ug/dL (ref 42–145)
Saturation Ratios: 31.8 % (ref 20.0–50.0)
TIBC: 204.4 ug/dL — ABNORMAL LOW (ref 250.0–450.0)
Transferrin: 146 mg/dL — ABNORMAL LOW (ref 212.0–360.0)

## 2023-12-12 LAB — VITAMIN B12: Vitamin B-12: 440 pg/mL (ref 211–911)

## 2023-12-12 MED ORDER — METFORMIN HCL 500 MG PO TABS: 500.0000 mg | ORAL_TABLET | Freq: Two times a day (BID) | ORAL | 1 refills | Status: DC

## 2023-12-12 MED ORDER — INTEGRA 62.5-62.5-40-3 MG PO CAPS: 1.0000 | ORAL_CAPSULE | ORAL | 3 refills | Status: AC

## 2023-12-12 MED ORDER — ROSUVASTATIN CALCIUM 20 MG PO TABS: 20.0000 mg | ORAL_TABLET | Freq: Every day | ORAL | 3 refills | Status: AC

## 2023-12-12 MED ORDER — PANTOPRAZOLE SODIUM 40 MG PO TBEC: 40.0000 mg | DELAYED_RELEASE_TABLET | Freq: Every day | ORAL | 3 refills | Status: AC

## 2023-12-12 NOTE — Progress Notes (Signed)
Subjective:    Patient ID: Angel Floyd, female    DOB: [birth year], 73 y.o.   MRN: [id number]  Patient here for  Chief Complaint  Patient presents with   Medical Management of Chronic Issues    HPI Here for a scheduled follow up -  f/u regarding hypercholesterolemia, diabetes and hypertension. S/p watchman procedure 01/2022. Continues on aspirin. Staying active. Saw Dr Lady Gary 10/10/22.  Previous functional study - no ischemia.  . Previous heart palpitations-ILR showed 2 minutes of probable A. fib. Now with Watchman device in place. Her CHA2DS2 - VASc is 2. Saw Dr. Toma Copier (EP) -  07/13/23 - recommended continuing aspirin daily. Loop recorder interrogated revealed a handful of brief AF episodes lasting 2-4 minutes. No PVC burden reported on her monitor.  Follow up one year. Also saw Dr Odis Luster for her right shoulder. Recommended PT and meloxicam. 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 Presented for screening mammogram at [facility] on 11/12/2023, which demonstrated amorphous calcifications spanning 4.7 cm in the left breast that were eventually biopsied and demonstrated LCIS (pleomorphic type) and intraductal papilloma. S/p biopsy last week. Waiting for biopsy results. No chest pain or sob reported. No cough or congestion reported. No abdominal pain or bowel change reported. Increased stress related to above. Discussed. She feels she is handling things well. Does report some restless legs. Increased leg movement at night. No increased pain.  Some minimal numbness.    Past Medical History:  Diagnosis Date   Anemia    Breast cancer (HCC)    COVID-19 08/05/2019   Diabetes mellitus (HCC)    Dyspnea    better since recovery from COVID   GERD (gastroesophageal reflux disease)    Heart murmur    History of abnormal Pap smear    class III, required cryosurgery   Hypercholesterolemia    Hypertension    Presence of Watchman left atrial appendage closure device    Skin cancer     x3   TIA (transient ischemic attack)    2008, 2018. no deficit   Past Surgical History:  Procedure Laterality Date   BREAST CYST ASPIRATION  1989   COLONOSCOPY WITH PROPOFOL N/A 11/05/2017   Procedure: COLONOSCOPY WITH PROPOFOL;  Surgeon: Christena Deem, MD;  Location: [facility] ENDOSCOPY;  Service: Endoscopy;  Laterality: N/A;   COLONOSCOPY WITH PROPOFOL N/A 04/02/2023   Procedure: COLONOSCOPY WITH PROPOFOL;  Surgeon: Regis Bill, MD;  Location: [facility] ENDOSCOPY;  Service: Endoscopy;  Laterality: N/A;   DILATION AND CURETTAGE OF UTERUS     ESOPHAGOGASTRODUODENOSCOPY (EGD) WITH PROPOFOL N/A 11/05/2017   Procedure: ESOPHAGOGASTRODUODENOSCOPY (EGD) WITH PROPOFOL;  Surgeon: Christena Deem, MD;  Location: [facility] ENDOSCOPY;  Service: Endoscopy;  Laterality: N/A;   FOOT SURGERY     morton's neuroma removed - right foot   FOOT SURGERY  10/2009   achilles tendon release with reconstructive surgery   GYNECOLOGIC CRYOSURGERY     class III pap   HAND SURGERY     cyst removed - left hand   hysteroscopy and D&C  7/09   KNEE ARTHROSCOPY Left [id number]   removed tear & resurfaced area   KNEE SURGERY  09/20/12   torn meniscus, (Dr Erin Sons)   LOOP RECORDER INSERTION N/A 12/28/2020   Procedure: LOOP RECORDER INSERTION;  Surgeon: Marcina Millard, MD;  Location: [facility] INVASIVE CV LAB;  Service: Cardiovascular;  Laterality: N/A;   SHOULDER ARTHROSCOPY WITH ROTATOR CUFF REPAIR Right 01/26/2020  Procedure: SHOULDER ARTHROSCOPY WITH ROTATOR CUFF REPAIR with distal clavicle resection, biceps tnotomy, subacromial decompression;  Surgeon: Lyndle Herrlich, MD;  Location: [facility];  Service: Orthopedics;  Laterality: Right;  Diabetic - oral meds   TUBAL LIGATION     Family History  Problem Relation Age of Onset   Lung cancer Father    Alcohol abuse Father    Cancer Father        lung   CVA Mother    Colon cancer Mother    Alcohol abuse Mother    Cancer Mother        colon    Stroke Mother    Colon cancer Sister    Diabetes Maternal Grandfather    Social History   Socioeconomic History   Marital status: Married    Spouse name: Not on file   Number of children: 2   Years of education: Not on file   Highest education level: Associate degree: occupational, Scientist, product/process development, or vocational program  Occupational History   Not on file  Tobacco Use   Smoking status: Never   Smokeless tobacco: Never  Vaping Use   Vaping status: Never Used  Substance and Sexual Activity   Alcohol use: No    Alcohol/week: 0.0 standard drinks of alcohol   Drug use: No   Sexual activity: Not Currently  Other Topics Concern   Not on file  Social History Narrative   Not on file   Social Drivers of Health   Financial Resource Strain: Low Risk  (12/10/2023)   Overall Financial Resource Strain (CARDIA)    Difficulty of Paying Living Expenses: Not hard at all  Food Insecurity: No Food Insecurity (12/10/2023)   Hunger Vital Sign    Worried About Running Out of Food in the Last Year: Never true    Ran Out of Food in the Last Year: Never true  Transportation Needs: No Transportation Needs (12/10/2023)   PRAPARE - Administrator, Civil Service (Medical): No    Lack of Transportation (Non-Medical): No  Physical Activity: Unknown (12/10/2023)   Exercise Vital Sign    Days of Exercise per Week: 0 days    Minutes of Exercise per Session: Not on file  Stress: No Stress Concern Present (12/10/2023)   [employer] of Occupational Health - Occupational Stress Questionnaire    Feeling of Stress : Not at all  Social Connections: Unknown (12/10/2023)   Social Connection and Isolation Panel [NHANES]    Frequency of Communication with Friends and Family: More than three times a week    Frequency of Social Gatherings with Friends and Family: Twice a week    Attends Religious Services: Patient declined    Database administrator or Organizations: Yes    Attends Hospital doctor: More than 4 times per year    Marital Status: Married     Review of Systems  Constitutional:  Negative for appetite change and unexpected weight change.  HENT:  Negative for congestion and sinus pressure.   Respiratory:  Negative for cough, chest tightness and shortness of breath.   Cardiovascular:  Negative for chest pain, palpitations and leg swelling.  Gastrointestinal:  Negative for abdominal pain, diarrhea, nausea and vomiting.  Genitourinary:  Negative for difficulty urinating and dysuria.  Musculoskeletal:  Negative for joint swelling and myalgias.       Restless legs as outlined.   Skin:  Negative for color change and rash.  Neurological:  Negative for dizziness and  headaches.  Psychiatric/Behavioral:  Negative for agitation and dysphoric mood.        Increased stress as outlined.        Objective:     BP 124/72   Pulse 79   Temp 98 F (36.7 C)   Resp 16   Ht 5\' 5"  (1.651 m)   Wt 155 lb (70.3 kg)   SpO2 98%   BMI 25.79 kg/m  Wt Readings from Last 3 Encounters:  12/12/23 155 lb (70.3 kg)  08/02/23 154 lb 6.4 oz (70 kg)  06/05/23 155 lb (70.3 kg)    Physical Exam Vitals reviewed.  Constitutional:      General: She is not in acute distress.    Appearance: Normal appearance.  HENT:     Head: Normocephalic and atraumatic.     Right Ear: External ear normal.     Left Ear: External ear normal.     Mouth/Throat:     Pharynx: No oropharyngeal exudate or posterior oropharyngeal erythema.  Eyes:     General: No scleral icterus.       Right eye: No discharge.        Left eye: No discharge.     Conjunctiva/sclera: Conjunctivae normal.  Neck:     Thyroid: No thyromegaly.  Cardiovascular:     Rate and Rhythm: Normal rate and regular rhythm.  Pulmonary:     Effort: No respiratory distress.     Breath sounds: Normal breath sounds. No wheezing.  Abdominal:     General: Bowel sounds are normal.     Palpations: Abdomen is soft.     Tenderness: There is  no abdominal tenderness.  Musculoskeletal:        General: No swelling or tenderness.     Cervical back: Neck supple. No tenderness.  Lymphadenopathy:     Cervical: No cervical adenopathy.  Skin:    Findings: No erythema or rash.  Neurological:     Mental Status: She is alert.  Psychiatric:        Mood and Affect: Mood normal.        Behavior: Behavior normal.         Outpatient Encounter Medications as of 12/12/2023  Medication Sig   ASPIRIN 81 PO Take 81 mg by mouth daily.   cholecalciferol (VITAMIN D) 25 MCG (1000 UNIT) tablet Take 1,000 Units by mouth daily.   Fe Fum-FePoly-Vit C-Vit B3 (INTEGRA) 62.5-62.5-40-3 MG CAPS Take 1 capsule by mouth every other day.   glucose blood test strip Use as instructed to check blood sugars three times daily. Has One Touch Ultra glucometer. Dx 250.00   meclizine (ANTIVERT) 25 MG tablet Take 1 tablet (25 mg total) by mouth 3 (three) times daily as needed for dizziness.   metFORMIN (GLUCOPHAGE) 500 MG tablet Take 1 tablet (500 mg total) by mouth 2 (two) times daily.   pantoprazole (PROTONIX) 40 MG tablet Take 1 tablet (40 mg total) by mouth daily.   rosuvastatin (CRESTOR) 20 MG tablet Take 1 tablet (20 mg total) by mouth daily.   telmisartan (MICARDIS) 20 MG tablet TAKE 1 TABLET DAILY   [DISCONTINUED] Fe Fum-FePoly-Vit C-Vit B3 (INTEGRA) 62.5-62.5-40-3 MG CAPS Take 1 capsule by mouth every other day.   [DISCONTINUED] metFORMIN (GLUCOPHAGE) 500 MG tablet Take 1 tablet (500 mg total) by mouth 2 (two) times daily.   [DISCONTINUED] pantoprazole (PROTONIX) 40 MG tablet Take 1 tablet (40 mg total) by mouth daily.   [DISCONTINUED] rosuvastatin (CRESTOR) 20 MG tablet Take 1 tablet (  20 mg total) by mouth daily.   No facility-administered encounter medications on file as of 12/12/2023.     Lab Results  Component Value Date   WBC 4.3 03/26/2023   HGB 13.4 03/26/2023   HCT 40.5 03/26/2023   PLT 168.0 03/26/2023   GLUCOSE 140 (H) 12/10/2023   CHOL  155 12/10/2023   TRIG 72.0 12/10/2023   HDL 63.60 12/10/2023   LDLCALC 77 12/10/2023   ALT 17 12/10/2023   AST 17 12/10/2023   NA 141 12/10/2023   K 3.8 12/10/2023   CL 103 12/10/2023   CREATININE 0.67 12/10/2023   BUN 17 12/10/2023   CO2 30 12/10/2023   TSH 1.81 12/10/2023   INR 1.0 03/13/2022   HGBA1C 7.1 (H) 12/10/2023   MICROALBUR <0.7 07/31/2023    No results found.     Assessment & Plan:  Primary hypertension Assessment & Plan: Continue micardis.  Follow pressures.  Follow metabolic panel.   Orders: -     Basic metabolic panel; Future -     CBC with Differential/Platelet; Future  Hypercholesterolemia Assessment & Plan: Continue crestor.  Low cholesterol diet and exercise.  Follow lipid panel and liver function tests.    Orders: -     Lipid panel; Future -     Hepatic function panel; Future  Type 2 diabetes mellitus with neurological complications [facility]) Assessment & Plan: On metformin bid.  Low carb diet and exercise.  Follow met b anda 1c. Discussed labs. Will hold on changing medication. Diet and exercise.  Follow.  Lab Results  Component Value Date   HGBA1C 7.1 (H) 12/10/2023    Orders: -     Hemoglobin A1c; Future  Restless legs Assessment & Plan: PT pulses palpable and equal bilaterally. Some minimal numbness. Check B12 and iron studies. Taking iron every other day.   Orders: -     Vitamin B12 -     IBC + Ferritin  History of TIA (transient ischemic attack) Assessment & Plan: On aspirin only now.  Cardiology stopped plavix secondary to nosebleeds. Follow. Continue risk factor modification.   History of breast cancer Assessment & Plan: Seeing oncology as outlined. Waiting for biopsy results.    Other orders -     Integra; Take 1 capsule by mouth every other day.  Dispense: 45 capsule; Refill: 3 -     metFORMIN HCl; Take 1 tablet (500 mg total) by mouth 2 (two) times daily.  Dispense: 180 tablet; Refill: 1 -     Pantoprazole Sodium; Take 1  tablet (40 mg total) by mouth daily.  Dispense: 90 tablet; Refill: 3 -     Rosuvastatin Calcium; Take 1 tablet (20 mg total) by mouth daily.  Dispense: 90 tablet; Refill: 3     Dale [place], MD

## 2023-12-12 NOTE — Assessment & Plan Note (Signed)
On aspirin only now.  Cardiology stopped plavix secondary to nosebleeds. Follow. Continue risk factor modification.

## 2023-12-12 NOTE — Assessment & Plan Note (Signed)
On metformin bid.  Low carb diet and exercise.  Follow met b anda 1c. Discussed labs. Will hold on changing medication. Diet and exercise.  Follow.  Lab Results  Component Value Date   HGBA1C 7.1 (H) 12/10/2023

## 2023-12-12 NOTE — Assessment & Plan Note (Signed)
Seeing oncology as outlined. Waiting for biopsy results.

## 2023-12-12 NOTE — Assessment & Plan Note (Signed)
Continue micardis.  Follow pressures.  Follow metabolic panel.

## 2023-12-12 NOTE — Assessment & Plan Note (Signed)
Continue crestor.  Low cholesterol diet and exercise.  Follow lipid panel and liver function tests.  

## 2023-12-12 NOTE — Assessment & Plan Note (Addendum)
PT pulses palpable and equal bilaterally. Some minimal numbness. Check B12 and iron studies. Taking iron every other day.

## 2023-12-13 ENCOUNTER — Encounter: Payer: Self-pay | Admitting: Internal Medicine

## 2023-12-24 ENCOUNTER — Encounter: Payer: Self-pay | Admitting: Internal Medicine

## 2024-01-01 ENCOUNTER — Encounter: Payer: Self-pay | Admitting: Internal Medicine

## 2024-04-09 ENCOUNTER — Other Ambulatory Visit: Payer: Medicare HMO

## 2024-04-11 ENCOUNTER — Ambulatory Visit: Payer: Medicare HMO | Admitting: Internal Medicine

## 2024-04-17 ENCOUNTER — Other Ambulatory Visit (INDEPENDENT_AMBULATORY_CARE_PROVIDER_SITE_OTHER): Payer: Medicare HMO

## 2024-04-17 DIAGNOSIS — E1149 Type 2 diabetes mellitus with other diabetic neurological complication: Secondary | ICD-10-CM

## 2024-04-17 DIAGNOSIS — E7800 Pure hypercholesterolemia, unspecified: Secondary | ICD-10-CM

## 2024-04-17 DIAGNOSIS — I10 Essential (primary) hypertension: Secondary | ICD-10-CM | POA: Diagnosis not present

## 2024-04-17 LAB — BASIC METABOLIC PANEL WITH GFR
BUN: 17 mg/dL (ref 6–23)
CO2: 32 meq/L (ref 19–32)
Calcium: 9.5 mg/dL (ref 8.4–10.5)
Chloride: 103 meq/L (ref 96–112)
Creatinine, Ser: 0.77 mg/dL (ref 0.40–1.20)
GFR: 77.21 mL/min (ref >60.00)
Glucose, Bld: 134 mg/dL — ABNORMAL HIGH (ref 70–99)
Potassium: 4.2 meq/L (ref 3.5–5.1)
Sodium: 140 meq/L (ref 135–145)

## 2024-04-17 LAB — LIPID PANEL
Cholesterol: 152 mg/dL (ref 0–200)
HDL: 62.9 mg/dL (ref >39.00)
LDL Cholesterol: 76 mg/dL (ref 0–99)
NonHDL: 89.58
Total CHOL/HDL Ratio: 2
Triglycerides: 67 mg/dL (ref 0.0–149.0)
VLDL: 13.4 mg/dL (ref 0.0–40.0)

## 2024-04-17 LAB — CBC WITH DIFFERENTIAL/PLATELET
Basophils Absolute: 0 10*3/uL (ref 0.0–0.1)
Basophils Relative: 0.6 % (ref 0.0–3.0)
Eosinophils Absolute: 0.3 10*3/uL (ref 0.0–0.7)
Eosinophils Relative: 5.1 % — ABNORMAL HIGH (ref 0.0–5.0)
HCT: 41.4 % (ref 36.0–46.0)
Hemoglobin: 13.7 g/dL (ref 12.0–15.0)
Lymphocytes Relative: 22.9 % (ref 12.0–46.0)
Lymphs Abs: 1.3 10*3/uL (ref 0.7–4.0)
MCHC: 33.2 g/dL (ref 30.0–36.0)
MCV: 90 fl (ref 78.0–100.0)
Monocytes Absolute: 0.3 10*3/uL (ref 0.1–1.0)
Monocytes Relative: 6 % (ref 3.0–12.0)
Neutro Abs: 3.7 10*3/uL (ref 1.4–7.7)
Neutrophils Relative %: 65.4 % (ref 43.0–77.0)
Platelets: 181 10*3/uL (ref 150.0–400.0)
RBC: 4.6 Mil/uL (ref 3.87–5.11)
RDW: 12.5 % (ref 11.5–15.5)
WBC: 5.6 10*3/uL (ref 4.0–10.5)

## 2024-04-17 LAB — HEPATIC FUNCTION PANEL
ALT: 15 U/L (ref 0–35)
AST: 17 U/L (ref 0–37)
Albumin: 4.2 g/dL (ref 3.5–5.2)
Alkaline Phosphatase: 61 U/L (ref 39–117)
Bilirubin, Direct: 0.1 mg/dL (ref 0.0–0.3)
Total Bilirubin: 0.6 mg/dL (ref 0.2–1.2)
Total Protein: 6.3 g/dL (ref 6.0–8.3)

## 2024-04-17 LAB — HEMOGLOBIN A1C: Hgb A1c MFr Bld: 7.2 % — ABNORMAL HIGH (ref 4.6–6.5)

## 2024-04-18 ENCOUNTER — Ambulatory Visit: Payer: Self-pay | Admitting: Internal Medicine

## 2024-04-21 ENCOUNTER — Ambulatory Visit: Payer: Medicare HMO | Admitting: Internal Medicine

## 2024-04-21 VITALS — BP 110/70 | HR 63 | Temp 98.0°F | Resp 16 | Ht 65.0 in | Wt 156.6 lb

## 2024-04-21 DIAGNOSIS — R7989 Other specified abnormal findings of blood chemistry: Secondary | ICD-10-CM | POA: Diagnosis not present

## 2024-04-21 DIAGNOSIS — E1149 Type 2 diabetes mellitus with other diabetic neurological complication: Secondary | ICD-10-CM | POA: Diagnosis not present

## 2024-04-21 DIAGNOSIS — D0500 Lobular carcinoma in situ of unspecified breast: Secondary | ICD-10-CM

## 2024-04-21 DIAGNOSIS — I10 Essential (primary) hypertension: Secondary | ICD-10-CM

## 2024-04-21 DIAGNOSIS — Z7984 Long term (current) use of oral hypoglycemic drugs: Secondary | ICD-10-CM

## 2024-04-21 DIAGNOSIS — E7800 Pure hypercholesterolemia, unspecified: Secondary | ICD-10-CM

## 2024-04-21 DIAGNOSIS — R200 Anesthesia of skin: Secondary | ICD-10-CM

## 2024-04-21 LAB — HM DIABETES FOOT EXAM

## 2024-04-21 MED ORDER — EMPAGLIFLOZIN 10 MG PO TABS: 10.0000 mg | ORAL_TABLET | Freq: Every day | ORAL | 2 refills | Status: DC

## 2024-04-21 NOTE — Progress Notes (Signed)
 Subjective:    Patient ID: Angel Floyd, female    DOB: 07-03-1951, 73 y.o.   MRN: [id number]  Patient here for  Chief Complaint  Patient presents with   Medical Management of Chronic Issues    HPI Here for a scheduled follow up -  f/u regarding hypercholesterolemia, diabetes and hypertension. S/p watchman procedure 01/2022. Continues on aspirin . 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 S/p localized lumpectomy 12/31/23. She has a history of left breast DCIS, s/p lumpectomy and radiation in 2012. Was referred for genetic testing. She is doing relatively well since her surgery. Incisions healed well. No pain. Stays active. No chest pain or sob reported.  No abdominal pain or bowel changes reported.  Discussed her labs.  Discussed A1c increased to 7.2.  Discussed additional medication.  She prefers not to add medication.  She is concerned about continuing to stay on the metformin .  Would like to change to Jardiance .  She does report noticing a numb sensation in the ball of her feet that will extend to her toes if she flexes her feet.  No pain with walking.   Past Medical History:  Diagnosis Date   Anemia    Breast cancer (HCC)    COVID-19 08/05/2019   Diabetes mellitus (HCC)    Dyspnea    better since recovery from COVID   GERD (gastroesophageal reflux disease)    Heart murmur    History of abnormal Pap smear    class III, required cryosurgery   Hypercholesterolemia    Hypertension    Presence of Watchman left atrial appendage closure device    Skin cancer    x3   TIA (transient ischemic attack)    2008, 2018. no deficit   Past Surgical History:  Procedure Laterality Date   BREAST CYST ASPIRATION  1989   COLONOSCOPY WITH PROPOFOL  N/A 11/05/2017   Procedure: COLONOSCOPY WITH PROPOFOL ;  Surgeon: Deveron Fly, MD;  Location: [facility] ENDOSCOPY;  Service: Endoscopy;  Laterality: N/A;   COLONOSCOPY WITH PROPOFOL  N/A 04/02/2023   Procedure: COLONOSCOPY WITH PROPOFOL ;   Surgeon: Shane Darling, MD;  Location: [facility] ENDOSCOPY;  Service: Endoscopy;  Laterality: N/A;   DILATION AND CURETTAGE OF UTERUS     ESOPHAGOGASTRODUODENOSCOPY (EGD) WITH PROPOFOL  N/A 11/05/2017   Procedure: ESOPHAGOGASTRODUODENOSCOPY (EGD) WITH PROPOFOL ;  Surgeon: Deveron Fly, MD;  Location: [facility] ENDOSCOPY;  Service: Endoscopy;  Laterality: N/A;   FOOT SURGERY     morton's neuroma removed - right foot   FOOT SURGERY  10/2009   achilles tendon release with reconstructive surgery   GYNECOLOGIC CRYOSURGERY     class III pap   HAND SURGERY     cyst removed - left hand   hysteroscopy and D&C  7/09   KNEE ARTHROSCOPY Left [id number]   removed tear & resurfaced area   KNEE SURGERY  09/20/12   torn meniscus, (Dr Josephus Nida)   LOOP RECORDER INSERTION N/A 12/28/2020   Procedure: LOOP RECORDER INSERTION;  Surgeon: Percival Brace, MD;  Location: [facility] INVASIVE CV LAB;  Service: Cardiovascular;  Laterality: N/A;   SHOULDER ARTHROSCOPY WITH ROTATOR CUFF REPAIR Right 01/26/2020   Procedure: SHOULDER ARTHROSCOPY WITH ROTATOR CUFF REPAIR with distal clavicle resection, biceps tnotomy, subacromial decompression;  Surgeon: Jerlyn Moons, MD;  Location: [facility];  Service: Orthopedics;  Laterality: Right;  Diabetic - oral meds   TUBAL LIGATION     Family History  Problem Relation Age of Onset   Lung cancer Father  Alcohol abuse Father    Cancer Father        lung   CVA Mother    Colon cancer Mother    Alcohol abuse Mother    Cancer Mother        colon   Stroke Mother    Colon cancer Sister    Diabetes Maternal Grandfather    Social History   Socioeconomic History   Marital status: Married    Spouse name: Not on file   Number of children: 2   Years of education: Not on file   Highest education level: Associate degree: occupational, Scientist, product/process development, or vocational program  Occupational History   Not on file  Tobacco Use   Smoking status: Never   Smokeless  tobacco: Never  Vaping Use   Vaping status: Never Used  Substance and Sexual Activity   Alcohol use: No    Alcohol/week: 0.0 standard drinks of alcohol   Drug use: No   Sexual activity: Not Currently  Other Topics Concern   Not on file  Social History Narrative   Not on file   Social Drivers of Health   Financial Resource Strain: Low Risk  (12/10/2023)   Overall Financial Resource Strain (CARDIA)    Difficulty of Paying Living Expenses: Not hard at all  Food Insecurity: No Food Insecurity (12/10/2023)   Hunger Vital Sign    Worried About Running Out of Food in the Last Year: Never true    Ran Out of Food in the Last Year: Never true  Transportation Needs: No Transportation Needs (12/10/2023)   PRAPARE - Administrator, Civil Service (Medical): No    Lack of Transportation (Non-Medical): No  Physical Activity: Inactive (12/21/2023)   Received from [facility]   Exercise Vital Sign    Days of Exercise per Week: 0 days    Minutes of Exercise per Session: 0 min  Stress: No Stress Concern Present (12/10/2023)   [employer] of Occupational Health - Occupational Stress Questionnaire    Feeling of Stress : Not at all  Social Connections: Socially Integrated (12/21/2023)   Received from [facility]   Social Connection and Isolation Panel [NHANES]    Frequency of Communication with Friends and Family: More than three times a week    Frequency of Social Gatherings with Friends and Family: Three times a week    Attends Religious Services: More than 4 times per year    Active Member of Clubs or Organizations: Yes    Attends Banker Meetings: More than 4 times per year    Marital Status: Married     Review of Systems  Constitutional:  Negative for appetite change and unexpected weight change.  HENT:  Negative for congestion and sinus pressure.   Respiratory:  Negative for cough, chest tightness and shortness of breath.    Cardiovascular:  Negative for chest pain, palpitations and leg swelling.  Gastrointestinal:  Negative for abdominal pain, diarrhea, nausea and vomiting.  Genitourinary:  Negative for difficulty urinating and dysuria.  Musculoskeletal:  Negative for joint swelling and myalgias.  Skin:  Negative for color change and rash.  Neurological:  Negative for dizziness and headaches.       Numb sensation - ball of foot - when stretches - extends to toes.   Psychiatric/Behavioral:  Negative for agitation and dysphoric mood.        Objective:     BP 110/70   Pulse 63   Temp  98 F (36.7 C)   Resp 16   Ht 5\' 5"  (1.651 m)   Wt 156 lb 9.6 oz (71 kg)   SpO2 98%   BMI 26.06 kg/m  Wt Readings from Last 3 Encounters:  04/21/24 156 lb 9.6 oz (71 kg)  12/12/23 155 lb (70.3 kg)  08/02/23 154 lb 6.4 oz (70 kg)    Physical Exam Vitals reviewed.  Constitutional:      General: She is not in acute distress.    Appearance: Normal appearance.  HENT:     Head: Normocephalic and atraumatic.     Right Ear: External ear normal.     Left Ear: External ear normal.     Mouth/Throat:     Pharynx: No oropharyngeal exudate or posterior oropharyngeal erythema.  Eyes:     General: No scleral icterus.       Right eye: No discharge.        Left eye: No discharge.     Conjunctiva/sclera: Conjunctivae normal.  Neck:     Thyroid : No thyromegaly.  Cardiovascular:     Rate and Rhythm: Normal rate and regular rhythm.  Pulmonary:     Effort: No respiratory distress.     Breath sounds: Normal breath sounds. No wheezing.  Abdominal:     General: Bowel sounds are normal.     Palpations: Abdomen is soft.     Tenderness: There is no abdominal tenderness.  Musculoskeletal:        General: No swelling or tenderness.     Cervical back: Neck supple. No tenderness.  Lymphadenopathy:     Cervical: No cervical adenopathy.  Skin:    Findings: No erythema or rash.  Neurological:     Mental Status: She is alert.   Psychiatric:        Mood and Affect: Mood normal.        Behavior: Behavior normal.      Diabetic foot exam was performed with the following findings:   No deformities, ulcerations, or other skin breakdown Normal sensation of 10g monofilament Intact posterior tibialis and dorsalis pedis pulses      Outpatient Encounter Medications as of 04/21/2024  Medication Sig   empagliflozin  (JARDIANCE ) 10 MG TABS tablet Take 1 tablet (10 mg total) by mouth daily before breakfast.   ASPIRIN  81 PO Take 81 mg by mouth daily.   cholecalciferol (VITAMIN D) 25 MCG (1000 UNIT) tablet Take 1,000 Units by mouth daily.   Fe Fum-FePoly-Vit C-Vit B3 (INTEGRA) 62.5-62.5-40-3 MG CAPS Take 1 capsule by mouth every other day.   glucose blood test strip Use as instructed to check blood sugars three times daily. Has One Touch Ultra glucometer. Dx 250.00   meclizine  (ANTIVERT ) 25 MG tablet Take 1 tablet (25 mg total) by mouth 3 (three) times daily as needed for dizziness.   pantoprazole  (PROTONIX ) 40 MG tablet Take 1 tablet (40 mg total) by mouth daily.   rosuvastatin  (CRESTOR ) 20 MG tablet Take 1 tablet (20 mg total) by mouth daily.   telmisartan  (MICARDIS ) 20 MG tablet TAKE 1 TABLET DAILY   [DISCONTINUED] metFORMIN  (GLUCOPHAGE ) 500 MG tablet Take 1 tablet (500 mg total) by mouth 2 (two) times daily.   No facility-administered encounter medications on file as of 04/21/2024.     Lab Results  Component Value Date   WBC 5.6 04/17/2024   HGB 13.7 04/17/2024   HCT 41.4 04/17/2024   PLT 181.0 04/17/2024   GLUCOSE 134 (H) 04/17/2024   CHOL 152 04/17/2024  TRIG 67.0 04/17/2024   HDL 62.90 04/17/2024   LDLCALC 76 04/17/2024   ALT 15 04/17/2024   AST 17 04/17/2024   NA 140 04/17/2024   K 4.2 04/17/2024   CL 103 04/17/2024   CREATININE 0.77 04/17/2024   BUN 17 04/17/2024   CO2 32 04/17/2024   TSH 1.81 12/10/2023   INR 1.0 03/13/2022   HGBA1C 7.2 (H) 04/17/2024   MICROALBUR <0.7 07/31/2023        Assessment & Plan:  Type 2 diabetes mellitus with neurological complications (HCC) Assessment & Plan: On metformin  bid.  Low carb diet and exercise.  Discussed labs.  A1c increased - 7.2. discussed additional medication. Has concerns regarding taking metformin . Will stop metformin . Start jardiance  10mg  q day. Discussed possible side effects of medication. Stay hydrated. Follow sugars.  Lab Results  Component Value Date   HGBA1C 7.2 (H) 04/17/2024    Orders: -     Hemoglobin A1c; Future  Hypercholesterolemia Assessment & Plan: Continue crestor .  Low cholesterol diet and exercise.  Follow lipid panel and liver function tests.   Lab Results  Component Value Date   CHOL 152 04/17/2024   HDL 62.90 04/17/2024   LDLCALC 76 04/17/2024   TRIG 67.0 04/17/2024   CHOLHDL 2 04/17/2024     Orders: -     Lipid panel; Future -     Hepatic function panel; Future  Primary hypertension Assessment & Plan: Continue micardis .  Follow pressures.  Follow metabolic panel. No changes in blood pressure medication today.   Orders: -     Basic metabolic panel with GFR; Future  Abnormal liver function test Assessment & Plan: Continue diet and exercise. Follow liver panel    Numbness of foot Assessment & Plan: Reports noticing a numb sensation in the ball of her feet that will extend to her toes if she flexes her feet.  No pain with walking. Request referral to podiatry - [place].   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 S/p localized lumpectomy 12/31/23. She has a history of left breast DCIS, s/p lumpectomy and radiation in 2012. Was referred for genetic testing. She is doing relatively well since her surgery. Incisions healed well. No pain. Continue f/u with oncology.    Other orders -      Empagliflozin ; Take 1 tablet (10 mg total) by mouth daily before breakfast.  Dispense: 30 tablet; Refill: 2     Dellar Fenton, MD

## 2024-04-21 NOTE — Assessment & Plan Note (Addendum)
 On metformin  bid.  Low carb diet and exercise.  Discussed labs.  A1c increased - 7.2. discussed additional medication. Has concerns regarding taking metformin . Will stop metformin . Start jardiance  10mg  q day. Discussed possible side effects of medication. Stay hydrated. Follow sugars.  Lab Results  Component Value Date   HGBA1C 7.2 (H) 04/17/2024

## 2024-04-27 ENCOUNTER — Encounter: Payer: Self-pay | Admitting: Internal Medicine

## 2024-04-27 DIAGNOSIS — R200 Anesthesia of skin: Secondary | ICD-10-CM | POA: Insufficient documentation

## 2024-04-27 DIAGNOSIS — D0500 Lobular carcinoma in situ of unspecified breast: Secondary | ICD-10-CM | POA: Insufficient documentation

## 2024-04-27 NOTE — Assessment & Plan Note (Signed)
 Continue micardis .  Follow pressures.  Follow metabolic panel. No changes in blood pressure medication today.

## 2024-04-27 NOTE — Assessment & Plan Note (Signed)
 Reports noticing a numb sensation in the ball of her feet that will extend to her toes if she flexes her feet.  No pain with walking. Request referral to podiatry - [place].

## 2024-04-27 NOTE — Assessment & Plan Note (Signed)
 Continue crestor .  Low cholesterol diet and exercise.  Follow lipid panel and liver function tests.   Lab Results  Component Value Date   CHOL 152 04/17/2024   HDL 62.90 04/17/2024   LDLCALC 76 04/17/2024   TRIG 67.0 04/17/2024   CHOLHDL 2 04/17/2024

## 2024-04-27 NOTE — Assessment & Plan Note (Signed)
Continue diet and exercise. Follow liver panel.  

## 2024-04-27 NOTE — Assessment & Plan Note (Signed)
 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 S/p localized lumpectomy 12/31/23. She has a history of left breast DCIS, s/p lumpectomy and radiation in 2012. Was referred for genetic testing. She is doing relatively well since her surgery. Incisions healed well. No pain. Continue f/u with oncology.

## 2024-04-28 NOTE — Addendum Note (Signed)
 Addended by: Raejean Bullock on: 04/28/2024 07:34 AM   Modules accepted: Orders

## 2024-05-05 ENCOUNTER — Encounter: Payer: Self-pay | Admitting: Internal Medicine

## 2024-05-06 ENCOUNTER — Other Ambulatory Visit: Payer: Self-pay

## 2024-05-06 MED ORDER — EMPAGLIFLOZIN 10 MG PO TABS: 10.0000 mg | ORAL_TABLET | Freq: Every day | ORAL | 3 refills | Status: DC

## 2024-08-12 ENCOUNTER — Encounter: Payer: Self-pay | Admitting: Internal Medicine

## 2024-08-13 ENCOUNTER — Ambulatory Visit: Payer: Self-pay | Admitting: Internal Medicine

## 2024-09-08 ENCOUNTER — Other Ambulatory Visit

## 2024-09-08 ENCOUNTER — Other Ambulatory Visit (INDEPENDENT_AMBULATORY_CARE_PROVIDER_SITE_OTHER)

## 2024-09-08 DIAGNOSIS — I10 Essential (primary) hypertension: Secondary | ICD-10-CM | POA: Diagnosis not present

## 2024-09-08 DIAGNOSIS — E7800 Pure hypercholesterolemia, unspecified: Secondary | ICD-10-CM

## 2024-09-08 DIAGNOSIS — E1149 Type 2 diabetes mellitus with other diabetic neurological complication: Secondary | ICD-10-CM | POA: Diagnosis not present

## 2024-09-08 LAB — BASIC METABOLIC PANEL WITH GFR
BUN: 23 mg/dL (ref 6–23)
CO2: 30 meq/L (ref 19–32)
Calcium: 9.1 mg/dL (ref 8.4–10.5)
Chloride: 105 meq/L (ref 96–112)
Creatinine, Ser: 0.83 mg/dL (ref 0.40–1.20)
GFR: 70.37 mL/min (ref >60.00)
Glucose, Bld: 143 mg/dL — ABNORMAL HIGH (ref 70–99)
Potassium: 4 meq/L (ref 3.5–5.1)
Sodium: 142 meq/L (ref 135–145)

## 2024-09-08 LAB — LIPID PANEL
Cholesterol: 170 mg/dL (ref 0–200)
HDL: 64.7 mg/dL (ref >39.00)
LDL Cholesterol: 94 mg/dL (ref 0–99)
NonHDL: 105.33
Total CHOL/HDL Ratio: 3
Triglycerides: 56 mg/dL (ref 0.0–149.0)
VLDL: 11.2 mg/dL (ref 0.0–40.0)

## 2024-09-08 LAB — HEPATIC FUNCTION PANEL
ALT: 21 U/L (ref 0–35)
AST: 18 U/L (ref 0–37)
Albumin: 4.3 g/dL (ref 3.5–5.2)
Alkaline Phosphatase: 67 U/L (ref 39–117)
Bilirubin, Direct: 0.1 mg/dL (ref 0.0–0.3)
Total Bilirubin: 0.5 mg/dL (ref 0.2–1.2)
Total Protein: 6.4 g/dL (ref 6.0–8.3)

## 2024-09-08 LAB — HEMOGLOBIN A1C: Hgb A1c MFr Bld: 7.5 % — ABNORMAL HIGH (ref 4.6–6.5)

## 2024-09-09 ENCOUNTER — Ambulatory Visit: Payer: Self-pay | Admitting: Internal Medicine

## 2024-09-10 ENCOUNTER — Ambulatory Visit: Admitting: Internal Medicine

## 2024-09-10 VITALS — BP 110/68 | HR 60 | Temp 97.4°F | Ht 65.0 in | Wt 154.4 lb

## 2024-09-10 DIAGNOSIS — E7800 Pure hypercholesterolemia, unspecified: Secondary | ICD-10-CM | POA: Diagnosis not present

## 2024-09-10 DIAGNOSIS — I10 Essential (primary) hypertension: Secondary | ICD-10-CM

## 2024-09-10 DIAGNOSIS — E1149 Type 2 diabetes mellitus with other diabetic neurological complication: Secondary | ICD-10-CM

## 2024-09-10 DIAGNOSIS — Z8673 Personal history of transient ischemic attack (TIA), and cerebral infarction without residual deficits: Secondary | ICD-10-CM

## 2024-09-10 DIAGNOSIS — Z7984 Long term (current) use of oral hypoglycemic drugs: Secondary | ICD-10-CM

## 2024-09-10 DIAGNOSIS — Z0000 Encounter for general adult medical examination without abnormal findings: Secondary | ICD-10-CM | POA: Diagnosis not present

## 2024-09-10 LAB — MICROALBUMIN / CREATININE URINE RATIO
Creatinine,U: 76.5 mg/dL
Microalb Creat Ratio: UNDETERMINED mg/g (ref 0.0–30.0)
Microalb, Ur: <0.7 mg/dL

## 2024-09-10 MED ORDER — EMPAGLIFLOZIN 25 MG PO TABS: 25.0000 mg | ORAL_TABLET | Freq: Every day | ORAL | 1 refills | Status: DC

## 2024-09-10 NOTE — Progress Notes (Signed)
 Subjective:    Patient ID: Angel Floyd, female    DOB: 02/20/1951, 73 y.o.   MRN: [id number]  Patient here for  Chief Complaint  Patient presents with   Annual Exam    HPI Here for a physical exam. Saw neurology 08/13/24 - NCS - generalized sensory peripheral neuropathy. Recommended exercise. S/p watchman procedure 01/2022. Continues on aspirin . 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 S/p localized lumpectomy 12/31/23. She has a history of left breast DCIS, s/p lumpectomy and radiation in 2012. Was referred for genetic testing. Recent abnormal mammogram 10/2023 - s/p biopsy. States everything checkout fine. Planning f/u 10/2024. Had f/u with cardiology 04/23/24 - recommended continue aspirin , crestor , micardis . Discussed recent labs. A1c increased 7.5.  Has been eating more bread. Discussed low carb diet and exercise. She plans to start walking more. Overall feels good.    Past Medical History:  Diagnosis Date   Anemia    Breast cancer (HCC)    COVID-19 08/05/2019   Diabetes mellitus (HCC)    Dyspnea    better since recovery from COVID   GERD (gastroesophageal reflux disease)    Heart murmur    History of abnormal Pap smear    class III, required cryosurgery   Hypercholesterolemia    Hypertension    Presence of Watchman left atrial appendage closure device    Skin cancer    x3   TIA (transient ischemic attack)    2008, 2018. no deficit   Past Surgical History:  Procedure Laterality Date   BREAST CYST ASPIRATION  1989   COLONOSCOPY WITH PROPOFOL  N/A 11/05/2017   Procedure: COLONOSCOPY WITH PROPOFOL ;  Surgeon: Gaylyn Gladis PENNER, MD;  Location: [facility] END[facility]COPY;  Service: Endoscopy;  Laterality: N/A;   COLONOSCOPY WITH PROPOFOL  N/A 04/02/2023   Procedure: COLONOSCOPY WITH PROPOFOL ;  Surgeon: Maryruth Ole DASEN, MD;  Location: [facility] ENDOSCOPY;  Service: Endoscopy;  Laterality: N/A;   DILATION AND CURETTAGE OF UTERUS     ESOPHAGOGASTRODUODENOSCOPY (EGD) WITH  PROPOFOL  N/A 11/05/2017   Procedure: ESOPHAGOGASTRODUODENOSCOPY (EGD) WITH PROPOFOL ;  Surgeon: Gaylyn Gladis PENNER, MD;  Location: [facility] ENDOSCOPY;  Service: Endoscopy;  Laterality: N/A;   FOOT SURGERY     morton's neuroma removed - right foot   FOOT SURGERY  10/2009   achilles tendon release with reconstructive surgery   GYNECOLOGIC CRYOSURGERY     class III pap   HAND SURGERY     cyst removed - left hand   hysteroscopy and D&C  7/09   KNEE ARTHROSCOPY Left [id number]   removed tear & resurfaced area   KNEE SURGERY  09/20/12   torn meniscus, (Dr Helayne Glenn)   LOOP RECORDER INSERTION N/A 12/28/2020   Procedure: LOOP RECORDER INSERTION;  Surgeon: Ammon Blunt, MD;  Location: [facility] INVASIVE CV LAB;  Service: Cardiovascular;  Laterality: N/A;   SHOULDER ARTHROSCOPY WITH ROTATOR CUFF REPAIR Right 01/26/2020   Procedure: SHOULDER ARTHROSCOPY WITH ROTATOR CUFF REPAIR with distal clavicle resection, biceps tnotomy, subacromial decompression;  Surgeon: Leora Lynwood SAUNDERS, MD;  Location: [facility];  Service: Orthopedics;  Laterality: Right;  Diabetic - oral meds   TUBAL LIGATION     Family History  Problem Relation Age of Onset   Lung cancer Father    Alcohol abuse Father    Cancer Father        lung   CVA Mother    Colon cancer Mother    Alcohol abuse Mother    Cancer Mother  colon   Stroke Mother    Colon cancer Sister    Diabetes Maternal Grandfather    Social History   Socioeconomic History   Marital status: Married    Spouse name: Not on file   Number of children: 2   Years of education: Not on file   Highest education level: Associate degree: occupational, scientist, product/process development, or vocational program  Occupational History   Not on file  Tobacco Use   Smoking status: Never   Smokeless tobacco: Never  Vaping Use   Vaping status: Never Used  Substance and Sexual Activity   Alcohol use: No    Alcohol/week: 0.0 standard drinks of alcohol   Drug use: No   Sexual  activity: Not Currently  Other Topics Concern   Not on file  Social History Narrative   Not on file   Social Drivers of Health   Financial Resource Strain: Low Risk  (06/05/2024)   Received from [facility]   Overall Financial Resource Strain (CARDIA)    Difficulty of Paying Living Expenses: Not hard at all  Food Insecurity: No Food Insecurity (06/05/2024)   Received from [facility]   Hunger Vital Sign    Within the past 12 months, you worried that your food would run out before you got the money to buy more.: Never true    Within the past 12 months, the food you bought just didn't last and you didn't have money to get more.: Never true  Transportation Needs: No Transportation Needs (06/05/2024)   Received from [facility] - Transportation    In the past 12 months, has lack of transportation kept you from medical appointments or from getting medications?: No    Lack of Transportation (Non-Medical): No  Physical Activity: Inactive (12/21/2023)   Received from [facility]   Exercise Vital Sign    On average, how many days per week do you engage in moderate to strenuous exercise (like a brisk walk)?: 0 days    On average, how many minutes do you engage in exercise at this level?: 0 min  Stress: No Stress Concern Present (12/10/2023)   [employer] - Occupational Stress Questionnaire    Feeling of Stress : Not at all  Social Connections: Socially Integrated (12/21/2023)   Received from [facility]   Social Connection and Isolation Panel    In a typical week, how many times do you talk on the phone with family, friends, or neighbors?: More than three times a week    How often do you get together with friends or relatives?: Three times a week    How often do you attend church or religious services?: More than 4 times per year    Do you belong to any clubs or  organizations such as church groups, unions, fraternal or athletic groups, or school groups?: Yes    How often do you attend meetings of the clubs or organizations you belong to?: More than 4 times per year    Are you married, widowed, divorced, separated, never married, or living with a partner?: Married     Review of Systems  Constitutional:  Negative for appetite change and unexpected weight change.  HENT:  Negative for congestion, sinus pressure and sore throat.   Eyes:  Negative for pain and visual disturbance.  Respiratory:  Negative for cough, chest tightness and shortness of breath.   Cardiovascular:  Negative for  chest pain, palpitations and leg swelling.  Gastrointestinal:  Negative for abdominal pain, diarrhea, nausea and vomiting.  Genitourinary:  Negative for difficulty urinating and dysuria.  Musculoskeletal:  Negative for joint swelling and myalgias.  Skin:  Negative for color change and rash.  Neurological:  Negative for dizziness and headaches.  Hematological:  Negative for adenopathy. Does not bruise/bleed easily.  Psychiatric/Behavioral:  Negative for agitation and dysphoric mood.        Objective:     BP 110/68   Pulse 60   Temp (!) 97.4 F (36.3 C)   Ht 5' 5 (1.651 m)   Wt 154 lb 6.4 oz (70 kg)   SpO2 99%   BMI 25.69 kg/m  Wt Readings from Last 3 Encounters:  09/10/24 154 lb 6.4 oz (70 kg)  04/21/24 156 lb 9.6 oz (71 kg)  12/12/23 155 lb (70.3 kg)    Physical Exam Vitals reviewed.  Constitutional:      General: She is not in acute distress.    Appearance: Normal appearance. She is well-developed.  HENT:     Head: Normocephalic and atraumatic.     Right Ear: External ear normal.     Left Ear: External ear normal.     Mouth/Throat:     Pharynx: No oropharyngeal exudate or posterior oropharyngeal erythema.  Eyes:     General: No scleral icterus.       Right eye: No discharge.        Left eye: No discharge.     Conjunctiva/sclera: Conjunctivae  normal.  Neck:     Thyroid : No thyromegaly.  Cardiovascular:     Rate and Rhythm: Normal rate and regular rhythm.  Pulmonary:     Effort: No tachypnea, accessory muscle usage or respiratory distress.     Breath sounds: Normal breath sounds. No decreased breath sounds or wheezing.  Chest:  Breasts:    Right: No inverted nipple, mass, nipple discharge or tenderness (no axillary adenopathy).     Left: No inverted nipple, mass, nipple discharge or tenderness (no axilarry adenopathy).  Abdominal:     General: Bowel sounds are normal.     Palpations: Abdomen is soft.     Tenderness: There is no abdominal tenderness.  Musculoskeletal:        General: No swelling or tenderness.     Cervical back: Neck supple.  Lymphadenopathy:     Cervical: No cervical adenopathy.  Skin:    Findings: No erythema or rash.  Neurological:     Mental Status: She is alert and oriented to person, place, and time.  Psychiatric:        Mood and Affect: Mood normal.        Behavior: Behavior normal.         Outpatient Encounter Medications as of 09/10/2024  Medication Sig   ASPIRIN  81 PO Take 81 mg by mouth daily.   cholecalciferol (VITAMIN D) 25 MCG (1000 UNIT) tablet Take 1,000 Units by mouth daily.   empagliflozin  (JARDIANCE ) 25 MG TABS tablet Take 1 tablet (25 mg total) by mouth daily.   Fe Fum-FePoly-Vit C-Vit B3 (INTEGRA) 62.5-62.5-40-3 MG CAPS Take 1 capsule by mouth every other day.   glucose blood test strip Use as instructed to check blood sugars three times daily. Has One Touch Ultra glucometer. Dx 250.00   meclizine  (ANTIVERT ) 25 MG tablet Take 1 tablet (25 mg total) by mouth 3 (three) times daily as needed for dizziness.   pantoprazole  (PROTONIX ) 40 MG tablet Take 1  tablet (40 mg total) by mouth daily.   rosuvastatin  (CRESTOR ) 20 MG tablet Take 1 tablet (20 mg total) by mouth daily.   telmisartan  (MICARDIS ) 20 MG tablet TAKE 1 TABLET DAILY   [DISCONTINUED] empagliflozin  (JARDIANCE ) 10 MG TABS  tablet Take 1 tablet (10 mg total) by mouth daily before breakfast.   No facility-administered encounter medications on file as of 09/10/2024.     Lab Results  Component Value Date   WBC 5.6 04/17/2024   HGB 13.7 04/17/2024   HCT 41.4 04/17/2024   PLT 181.0 04/17/2024   GLUCOSE 143 (H) 09/08/2024   CHOL 170 09/08/2024   TRIG 56.0 09/08/2024   HDL 64.70 09/08/2024   LDLCALC 94 09/08/2024   ALT 21 09/08/2024   AST 18 09/08/2024   NA 142 09/08/2024   K 4.0 09/08/2024   CL 105 09/08/2024   CREATININE 0.83 09/08/2024   BUN 23 09/08/2024   CO2 30 09/08/2024   TSH 1.81 12/10/2023   INR 1.0 03/13/2022   HGBA1C 7.5 (H) 09/08/2024   MICROALBUR <0.7 09/10/2024       Assessment & Plan:  Health care maintenance Assessment & Plan: Physical today 09/10/24.  PAP 09/2017 - negative with negative HPV.  Colonoscopy 04/02/23.   Mammogram 11/08/22 - Birads II. Mammogram 11/12/23 - Birads 0. Recommended f/u left breast mammogram. F/u left breast mammogram 11/13/23 - recommended biopsy. S/p biopsy 11/15/23. Biopsy - Lobular carcinoma in situ (LCIS), Pleomorphic type With necrosis and calcifications. Intraductal papilloma. Invasive carcinoma is not identified. Has f/u scheduled in 10/2024.    Type 2 diabetes mellitus with neurological complications (HCC) Assessment & Plan: On metformin  bid.  Low carb diet and exercise.  Discussed labs.  A1c increased - 7.5. discussed additional medication.off metformin .  On jardiance  10mg  q day. Increase to 25mg  q day. Stay hydrated. Follow sugars.  Lab Results  Component Value Date   HGBA1C 7.5 (H) 09/08/2024    Orders: -     Microalbumin / creatinine urine ratio -     Hemoglobin A1c; Future  Hypercholesterolemia Assessment & Plan: Continue crestor . Low cholesterol diet and exercise. Follow lipid panel.  Lab Results  Component Value Date   CHOL 170 09/08/2024   HDL 64.70 09/08/2024   LDLCALC 94 09/08/2024   TRIG 56.0 09/08/2024   CHOLHDL 3  09/08/2024     Orders: -     Hepatic function panel; Future -     Lipid panel; Future  Primary hypertension Assessment & Plan: Continue micardis .  Follow pressures.  Follow metabolic panel. No change in medication today.   Orders: -     Basic metabolic panel with GFR; Future -     CBC with Differential/Platelet; Future  History of TIA (transient ischemic attack) Assessment & Plan: On aspirin  only now.  Cardiology stopped plavix  secondary to nosebleeds. Follow. Continue risk factor modification.   Other orders -     Empagliflozin ; Take 1 tablet (25 mg total) by mouth daily.  Dispense: 90 tablet; Refill: 1     Allena Hamilton, MD

## 2024-09-10 NOTE — Assessment & Plan Note (Addendum)
 Physical today 09/10/24.  PAP 09/2017 - negative with negative HPV.  Colonoscopy 04/02/23.   Mammogram 11/08/22 - Birads II. Mammogram 11/12/23 - Birads 0. Recommended f/u left breast mammogram. F/u left breast mammogram 11/13/23 - recommended biopsy. S/p biopsy 11/15/23. Biopsy - Lobular carcinoma in situ (LCIS), Pleomorphic type With necrosis and calcifications. Intraductal papilloma. Invasive carcinoma is not identified. Has f/u scheduled in 10/2024.

## 2024-09-11 ENCOUNTER — Ambulatory Visit: Payer: Self-pay | Admitting: Internal Medicine

## 2024-09-14 ENCOUNTER — Encounter: Payer: Self-pay | Admitting: Internal Medicine

## 2024-09-14 NOTE — Assessment & Plan Note (Signed)
 Continue micardis .  Follow pressures.  Follow metabolic panel. No change in medication today.

## 2024-09-14 NOTE — Assessment & Plan Note (Signed)
 On metformin  bid.  Low carb diet and exercise.  Discussed labs.  A1c increased - 7.5. discussed additional medication.off metformin .  On jardiance  10mg  q day. Increase to 25mg  q day. Stay hydrated. Follow sugars.  Lab Results  Component Value Date   HGBA1C 7.5 (H) 09/08/2024

## 2024-09-14 NOTE — Assessment & Plan Note (Signed)
 Continue crestor . Low cholesterol diet and exercise. Follow lipid panel.  Lab Results  Component Value Date   CHOL 170 09/08/2024   HDL 64.70 09/08/2024   LDLCALC 94 09/08/2024   TRIG 56.0 09/08/2024   CHOLHDL 3 09/08/2024

## 2024-09-14 NOTE — Assessment & Plan Note (Signed)
On aspirin only now.  Cardiology stopped plavix secondary to nosebleeds. Follow. Continue risk factor modification.

## 2024-09-29 ENCOUNTER — Encounter: Payer: Self-pay | Admitting: Internal Medicine

## 2024-09-29 MED ORDER — EMPAGLIFLOZIN 25 MG PO TABS: 25.0000 mg | ORAL_TABLET | Freq: Every day | ORAL | 1 refills | Status: AC

## 2024-10-20 ENCOUNTER — Encounter: Payer: Self-pay | Admitting: Internal Medicine

## 2024-10-28 ENCOUNTER — Encounter: Payer: Self-pay | Admitting: Internal Medicine

## 2024-11-25 ENCOUNTER — Other Ambulatory Visit: Payer: Self-pay | Admitting: Internal Medicine

## 2024-12-02 ENCOUNTER — Encounter: Payer: Self-pay | Admitting: Internal Medicine

## 2024-12-02 DIAGNOSIS — M25561 Pain in right knee: Secondary | ICD-10-CM | POA: Insufficient documentation

## 2024-12-10 LAB — OPHTHALMOLOGY REPORT-SCANNED

## 2024-12-26 ENCOUNTER — Other Ambulatory Visit: Payer: Self-pay

## 2024-12-26 ENCOUNTER — Encounter: Payer: Self-pay | Admitting: Internal Medicine

## 2024-12-26 MED ORDER — GLUCOSE BLOOD VI STRP: ORAL_STRIP | 2 refills | Status: AC

## 2025-01-12 ENCOUNTER — Other Ambulatory Visit

## 2025-01-14 ENCOUNTER — Ambulatory Visit: Admitting: Internal Medicine
